# Patient Record
Sex: Female | Born: 1935 | Race: White | Hispanic: No | Smoking: Former smoker
Health system: Southern US, Community
[De-identification: ages and names within clinical notes are randomized; demographics above are authoritative.]

## PROBLEM LIST (undated history)

## (undated) DIAGNOSIS — C50919 Malignant neoplasm of unspecified site of unspecified female breast: Secondary | ICD-10-CM

## (undated) DIAGNOSIS — C189 Malignant neoplasm of colon, unspecified: Secondary | ICD-10-CM

## (undated) DIAGNOSIS — E0590 Thyrotoxicosis, unspecified without thyrotoxic crisis or storm: Secondary | ICD-10-CM

## (undated) DIAGNOSIS — N183 Chronic kidney disease, stage 3 unspecified: Secondary | ICD-10-CM

## (undated) DIAGNOSIS — R42 Dizziness and giddiness: Secondary | ICD-10-CM

## (undated) DIAGNOSIS — E785 Hyperlipidemia, unspecified: Secondary | ICD-10-CM

## (undated) DIAGNOSIS — M1990 Unspecified osteoarthritis, unspecified site: Secondary | ICD-10-CM

## (undated) DIAGNOSIS — M810 Age-related osteoporosis without current pathological fracture: Secondary | ICD-10-CM

## (undated) DIAGNOSIS — D649 Anemia, unspecified: Secondary | ICD-10-CM

## (undated) DIAGNOSIS — K219 Gastro-esophageal reflux disease without esophagitis: Secondary | ICD-10-CM

## (undated) DIAGNOSIS — Z923 Personal history of irradiation: Secondary | ICD-10-CM

## (undated) DIAGNOSIS — H8109 Meniere's disease, unspecified ear: Secondary | ICD-10-CM

## (undated) DIAGNOSIS — I10 Essential (primary) hypertension: Secondary | ICD-10-CM

## (undated) HISTORY — DX: Unspecified osteoarthritis, unspecified site: M19.90

## (undated) HISTORY — DX: Age-related osteoporosis without current pathological fracture: M81.0

## (undated) HISTORY — DX: Dizziness and giddiness: R42

## (undated) HISTORY — DX: Gastro-esophageal reflux disease without esophagitis: K21.9

## (undated) HISTORY — DX: Malignant neoplasm of unspecified site of unspecified female breast: C50.919

## (undated) HISTORY — PX: BREAST LUMPECTOMY: SHX2

## (undated) HISTORY — DX: Thyrotoxicosis, unspecified without thyrotoxic crisis or storm: E05.90

## (undated) HISTORY — DX: Hyperlipidemia, unspecified: E78.5

## (undated) HISTORY — PX: TOTAL HIP ARTHROPLASTY: SHX124

## (undated) HISTORY — PX: APPENDECTOMY: SHX54

## (undated) HISTORY — DX: Essential (primary) hypertension: I10

## (undated) HISTORY — DX: Malignant neoplasm of colon, unspecified: C18.9

## (undated) HISTORY — PX: SMALL INTESTINE SURGERY: SHX150

## (undated) HISTORY — PX: BREAST SURGERY: SHX581

## (undated) HISTORY — PX: BREAST BIOPSY: SHX20

## (undated) HISTORY — PX: TONSILLECTOMY AND ADENOIDECTOMY: SUR1326

## (undated) HISTORY — PX: SPINE SURGERY: SHX786

## (undated) HISTORY — DX: Meniere's disease, unspecified ear: H81.09

---

## 2011-08-27 ENCOUNTER — Other Ambulatory Visit (INDEPENDENT_AMBULATORY_CARE_PROVIDER_SITE_OTHER): Payer: Medicare Other

## 2011-08-27 ENCOUNTER — Ambulatory Visit (INDEPENDENT_AMBULATORY_CARE_PROVIDER_SITE_OTHER): Payer: Medicare Other | Admitting: Internal Medicine

## 2011-08-27 ENCOUNTER — Encounter: Payer: Self-pay | Admitting: Internal Medicine

## 2011-08-27 VITALS — BP 138/82 | HR 80 | Temp 97.8°F | Resp 16 | Ht 62.5 in | Wt 128.0 lb

## 2011-08-27 DIAGNOSIS — E7800 Pure hypercholesterolemia, unspecified: Secondary | ICD-10-CM

## 2011-08-27 DIAGNOSIS — H409 Unspecified glaucoma: Secondary | ICD-10-CM | POA: Insufficient documentation

## 2011-08-27 DIAGNOSIS — Z1231 Encounter for screening mammogram for malignant neoplasm of breast: Secondary | ICD-10-CM

## 2011-08-27 DIAGNOSIS — I10 Essential (primary) hypertension: Secondary | ICD-10-CM

## 2011-08-27 LAB — LDL CHOLESTEROL, DIRECT: Direct LDL: 136.1 mg/dL

## 2011-08-27 LAB — CBC WITH DIFFERENTIAL/PLATELET
Basophils Relative: 0.5 % (ref 0.0–3.0)
Eosinophils Relative: 2.6 % (ref 0.0–5.0)
HCT: 36.6 % (ref 36.0–46.0)
Hemoglobin: 12.4 g/dL (ref 12.0–15.0)
Lymphocytes Relative: 13.3 % (ref 12.0–46.0)
Lymphs Abs: 1.3 10*3/uL (ref 0.7–4.0)
Monocytes Relative: 6.8 % (ref 3.0–12.0)
Neutro Abs: 7.4 10*3/uL (ref 1.4–7.7)
RBC: 3.76 Mil/uL — ABNORMAL LOW (ref 3.87–5.11)
WBC: 9.6 10*3/uL (ref 4.5–10.5)

## 2011-08-27 LAB — COMPREHENSIVE METABOLIC PANEL
CO2: 28 mEq/L (ref 19–32)
Calcium: 9.9 mg/dL (ref 8.4–10.5)
Chloride: 104 mEq/L (ref 96–112)
Creatinine, Ser: 1.5 mg/dL — ABNORMAL HIGH (ref 0.4–1.2)
GFR: 34.32 mL/min — ABNORMAL LOW (ref >60.00)
Glucose, Bld: 102 mg/dL — ABNORMAL HIGH (ref 70–99)
Total Bilirubin: 0.2 mg/dL — ABNORMAL LOW (ref 0.3–1.2)

## 2011-08-27 LAB — TSH: TSH: 1.64 u[IU]/mL (ref 0.35–5.50)

## 2011-08-27 LAB — LIPID PANEL
HDL: 48.7 mg/dL (ref >39.00)
Triglycerides: 297 mg/dL — ABNORMAL HIGH (ref 0.0–149.0)

## 2011-08-27 MED ORDER — VALSARTAN-HYDROCHLOROTHIAZIDE 320-25 MG PO TABS: 0.5000 | ORAL_TABLET | Freq: Every day | ORAL | Status: DC

## 2011-08-27 NOTE — Patient Instructions (Signed)

## 2011-08-29 ENCOUNTER — Encounter: Payer: Self-pay | Admitting: Internal Medicine

## 2011-08-29 NOTE — Assessment & Plan Note (Signed)
Her BP is well controlled, I will check her lytes and renal function today 

## 2011-08-29 NOTE — Assessment & Plan Note (Signed)
Check her FLP TSH and CMP today

## 2011-08-29 NOTE — Progress Notes (Signed)
  Subjective:    Patient ID: Stephanie Martinez, female    DOB: 11/19/1921, 78 y.o.   MRN: [id number]  Hypertension This is a chronic problem. The current episode started more than 1 year ago. The problem has been gradually improving since onset. The problem is controlled. Pertinent negatives include no anxiety, blurred vision, chest pain, headaches, malaise/fatigue, neck pain, orthopnea, palpitations, peripheral edema, PND, shortness of breath or sweats. Past treatments include angiotensin blockers and diuretics. The current treatment provides moderate improvement. There are no compliance problems.  There is no history of chronic renal disease.  Hyperlipidemia This is a chronic problem. The current episode started more than 1 year ago. The problem is uncontrolled. Recent lipid tests were reviewed and are variable. She has no history of chronic renal disease, diabetes, hypothyroidism, liver disease, obesity or nephrotic syndrome. Factors aggravating her hyperlipidemia include thiazides. Pertinent negatives include no chest pain, focal sensory loss, focal weakness, leg pain, myalgias or shortness of breath. She is currently on no antihyperlipidemic treatment. There are no compliance problems.       Review of Systems  Constitutional: Negative for fever, chills, malaise/fatigue, diaphoresis, activity change, appetite change, fatigue and unexpected weight change.  HENT: Negative.  Negative for neck pain.   Eyes: Negative.  Negative for blurred vision.  Respiratory: Negative for cough, chest tightness, shortness of breath, wheezing and stridor.   Cardiovascular: Negative for chest pain, palpitations, orthopnea, leg swelling and PND.  Gastrointestinal: Negative for nausea, vomiting, abdominal pain, diarrhea, constipation and blood in stool.  Genitourinary: Negative.   Musculoskeletal: Negative for myalgias, back pain, joint swelling, arthralgias and gait problem.  Skin: Negative for color change, pallor,  rash and wound.  Neurological: Negative for dizziness, tremors, focal weakness, seizures, syncope, facial asymmetry, speech difficulty, weakness, light-headedness, numbness and headaches.  Hematological: Negative for adenopathy. Does not bruise/bleed easily.  Psychiatric/Behavioral: Negative.        Objective:   Physical Exam  Vitals reviewed. Constitutional: She is oriented to person, place, and time. She appears well-developed and well-nourished. No distress.  HENT:  Head: Normocephalic and atraumatic.  Mouth/Throat: Oropharynx is clear and moist. No oropharyngeal exudate.  Eyes: Conjunctivae are normal. Right eye exhibits no discharge. Left eye exhibits no discharge. No scleral icterus.  Neck: Normal range of motion. Neck supple. No JVD present. No tracheal deviation present. No thyromegaly present.  Cardiovascular: Normal rate, regular rhythm, normal heart sounds and intact distal pulses.  Exam reveals no gallop and no friction rub.   No murmur heard. Pulmonary/Chest: Effort normal and breath sounds normal. No stridor. No respiratory distress. She has no wheezes. She has no rales. She exhibits no tenderness.  Abdominal: Soft. Bowel sounds are normal. She exhibits no distension and no mass. There is no tenderness. There is no rebound and no guarding.  Musculoskeletal: Normal range of motion. She exhibits no edema and no tenderness.  Lymphadenopathy:    She has no cervical adenopathy.  Neurological: She is oriented to person, place, and time.  Skin: Skin is warm and dry. No rash noted. She is not diaphoretic. No erythema. No pallor.  Psychiatric: She has a normal mood and affect. Her behavior is normal. Judgment and thought content normal.          Assessment & Plan:

## 2011-08-29 NOTE — Assessment & Plan Note (Signed)
Referred to ophth

## 2011-09-30 ENCOUNTER — Ambulatory Visit
Admission: RE | Admit: 2011-09-30 | Discharge: 2011-09-30 | Disposition: A | Discharge status: Home / Self Care | Payer: Medicare Other | Source: Ambulatory Visit | Attending: Internal Medicine | Admitting: Internal Medicine

## 2011-09-30 ENCOUNTER — Other Ambulatory Visit: Payer: Self-pay | Admitting: Internal Medicine

## 2011-09-30 DIAGNOSIS — Z9889 Other specified postprocedural states: Secondary | ICD-10-CM

## 2011-09-30 DIAGNOSIS — Z1231 Encounter for screening mammogram for malignant neoplasm of breast: Secondary | ICD-10-CM

## 2011-10-20 ENCOUNTER — Other Ambulatory Visit: Payer: Self-pay

## 2011-10-20 NOTE — Telephone Encounter (Signed)
Left message on machine for daughter to return my call and advise on name/location of pharmacy.

## 2011-10-21 MED ORDER — OMEPRAZOLE 20 MG PO CPDR: 20.0000 mg | DELAYED_RELEASE_CAPSULE | Freq: Every day | ORAL | Status: DC

## 2011-10-21 NOTE — Telephone Encounter (Signed)
Rx sent to pharmacy per daughter's instruction.

## 2011-10-27 ENCOUNTER — Ambulatory Visit (HOSPITAL_COMMUNITY): Payer: Medicare Other

## 2011-10-27 ENCOUNTER — Other Ambulatory Visit: Payer: Self-pay | Admitting: Internal Medicine

## 2011-10-27 ENCOUNTER — Ambulatory Visit
Admission: RE | Admit: 2011-10-27 | Discharge: 2011-10-27 | Disposition: A | Discharge status: Home / Self Care | Payer: Medicare Other | Source: Ambulatory Visit | Attending: Internal Medicine | Admitting: Internal Medicine

## 2011-10-27 DIAGNOSIS — Z9889 Other specified postprocedural states: Secondary | ICD-10-CM

## 2011-12-06 ENCOUNTER — Ambulatory Visit (INDEPENDENT_AMBULATORY_CARE_PROVIDER_SITE_OTHER): Payer: Medicare Other | Admitting: Internal Medicine

## 2011-12-06 ENCOUNTER — Encounter: Payer: Self-pay | Admitting: Internal Medicine

## 2011-12-06 VITALS — BP 130/88 | HR 90 | Temp 98.2°F | Resp 16 | Wt 124.8 lb

## 2011-12-06 DIAGNOSIS — R55 Syncope and collapse: Secondary | ICD-10-CM

## 2011-12-06 DIAGNOSIS — I10 Essential (primary) hypertension: Secondary | ICD-10-CM

## 2011-12-06 NOTE — Progress Notes (Signed)
Subjective:    Patient ID: Stephanie Martinez, female    DOB: 1922-03-17, 78 y.o.   MRN: [id number]  HPI She returns with the complaint that she has had "another fainting spell." She has had intermittent episodes of fainting since she was a teenager with no known triggers. Most recently she had a spell about 6 months ago in [facility] when she slid down in the bathroom and busted her head, her daughter tells me that all testing was normal at time.  Now she has had another event about 4 days ago when she was in the bathroom and she felt dizzy, wobbly, nauseated, and slid down. Her daughter tells me that she was "out of it" for a few seconds and that her eyes looked blank and distanced. After the spell 4 days ago she had profuse vomiting but nothing that looked post-ictal and there was no seizure activity. She also tells me that she has had a high pulse for a long time but she has not had palpitations. Since the spell 4 days ago she feels back to her baseline health with no complaints.   Review of Systems  Constitutional: Negative for fever, chills, diaphoresis, activity change, appetite change, fatigue and unexpected weight change.  HENT: Negative.   Eyes: Negative.   Respiratory: Negative for apnea, cough, choking, chest tightness, shortness of breath, wheezing and stridor.   Cardiovascular: Negative for chest pain, palpitations and leg swelling.  Gastrointestinal: Negative.   Genitourinary: Negative.   Musculoskeletal: Negative.   Skin: Negative.   Neurological: Positive for dizziness and syncope. Negative for tremors, seizures, facial asymmetry, speech difficulty, weakness, light-headedness, numbness and headaches.  Hematological: Negative for adenopathy. Does not bruise/bleed easily.  Psychiatric/Behavioral: Negative.        Objective:   Physical Exam  Vitals reviewed. Constitutional: She is oriented to person, place, and time. She appears well-developed and well-nourished. No distress.  HENT:    Head: Normocephalic and atraumatic.  Mouth/Throat: Oropharynx is clear and moist. No oropharyngeal exudate.  Eyes: Conjunctivae are normal. Right eye exhibits no discharge. Left eye exhibits no discharge. No scleral icterus.  Neck: Normal range of motion. Neck supple. No JVD present. No tracheal deviation present. No thyromegaly present.  Cardiovascular: Regular rhythm, S1 normal, S2 normal, normal heart sounds and intact distal pulses.   No extrasystoles are present. Tachycardia present.  Exam reveals no gallop, no S3 and no S4.   No murmur heard. Pulmonary/Chest: Effort normal and breath sounds normal. No stridor. No respiratory distress. She has no wheezes. She has no rales. She exhibits no tenderness.  Abdominal: Soft. Bowel sounds are normal. She exhibits no distension and no mass. There is no tenderness. There is no rebound and no guarding.  Musculoskeletal: Normal range of motion. She exhibits no edema and no tenderness.  Lymphadenopathy:    She has no cervical adenopathy.  Neurological: She is alert and oriented to person, place, and time. She has normal reflexes. She displays normal reflexes. No cranial nerve deficit. She exhibits normal muscle tone. Coordination normal.  Skin: Skin is warm and dry. No rash noted. She is not diaphoretic. No erythema. No pallor.  Psychiatric: She has a normal mood and affect. Her behavior is normal. Judgment and thought content normal.     Lab Results  Component Value Date   WBC 9.6 08/27/2011   HGB 12.4 08/27/2011   HCT 36.6 08/27/2011   PLT 319.0 08/27/2011   GLUCOSE 102* 08/27/2011   CHOL 225* 08/27/2011   TRIG 297.0*  08/27/2011   HDL 48.70 08/27/2011   LDLDIRECT 136.1 08/27/2011   ALT 15 08/27/2011   AST 22 08/27/2011   NA 141 08/27/2011   K 4.0 08/27/2011   CL 104 08/27/2011   CREATININE 1.5* 08/27/2011   BUN 23 08/27/2011   CO2 28 08/27/2011   TSH 1.64 08/27/2011      Assessment & Plan:

## 2011-12-06 NOTE — Assessment & Plan Note (Signed)
Her BP is well controlled 

## 2011-12-06 NOTE — Patient Instructions (Signed)
Syncope You have had a fainting (syncopal) spell. A fainting episode is a sudden, short-lived loss of consciousness. It results in complete recovery. It occurs because there has been a temporary shortage of oxygen and/or sugar (glucose) to the brain. CAUSES   Blood pressure pills and other medications that may lower blood pressure below normal. Sudden changes in posture (sudden standing).   Over-medication. Take your medications as directed.   Standing too long. This can cause blood to pool in the legs.   Seizure disorders.   Low blood sugar (hypoglycemia) of diabetes. This more commonly causes coma.   Bearing down to go to the bathroom. This can cause your blood pressure to rise suddenly. Your body compensates by making the blood pressure too low when you stop bearing down.   Hardening of the arteries where the brain temporarily does not receive enough blood.   Irregular heart beat and circulatory problems.   Fear, emotional distress, injury, sight of blood, or illness.  Your caregiver will send you home if the syncope was from non-worrisome causes (benign). Depending on your age and health, you may stay to be monitored and observed. If you return home, have someone stay with you if your caregiver feels that is desirable. It is very important to keep all follow-up referrals and appointments in order to properly manage this condition. This is a serious problem which can lead to serious illness and death if not carefully managed.  WARNING: Do not drive or operate machinery until your caregiver feels that it is safe for you to do so. SEEK IMMEDIATE MEDICAL CARE IF:   You have another fainting episode or faint while lying or sitting down. DO NOT DRIVE YOURSELF. Call 911 if no other help is available.   You have chest pain, are feeling sick to your stomach (nausea), vomiting or abdominal pain.   You have an irregular heartbeat or one that is very fast (pulse over 120 beats per minute).    You have a loss of feeling in some part of your body or lose movement in your arms or legs.   You have difficulty with speech, confusion, severe weakness, or visual problems.   You become sweaty and/or feel light headed.  Make sure you are rechecked as instructed. Document Released: 06/07/2005 Document Revised: 05/27/2011 Document Reviewed: 01/26/2007 [employer] Patient Information 2012 [employer]. 

## 2011-12-06 NOTE — Assessment & Plan Note (Signed)
Her EKG today shows sinus tachy with low RWP in V1 and V2, there is no LVH, ischemia, or dysrhythmia. I am concerned that she is having a cardiac dysrhyhmia as the cause of the spells, she does not have any post event neuro s/s to suggest an CNS event, her recent metabolic numbers look good, she does not describe anything that would precipitate hypoglycemia as she has no symptoms that lead up to the event, I have asked her to get an event monitor done in the hops that we will "catch" one of these spells.

## 2011-12-20 ENCOUNTER — Encounter (INDEPENDENT_AMBULATORY_CARE_PROVIDER_SITE_OTHER): Payer: Medicare Other

## 2011-12-20 DIAGNOSIS — R55 Syncope and collapse: Secondary | ICD-10-CM

## 2011-12-29 ENCOUNTER — Ambulatory Visit (INDEPENDENT_AMBULATORY_CARE_PROVIDER_SITE_OTHER): Payer: Medicare Other | Admitting: Internal Medicine

## 2011-12-29 ENCOUNTER — Encounter: Payer: Self-pay | Admitting: Internal Medicine

## 2011-12-29 ENCOUNTER — Telehealth: Payer: Self-pay | Admitting: Internal Medicine

## 2011-12-29 VITALS — BP 150/94 | HR 90 | Temp 97.5°F | Resp 20 | Wt 124.0 lb

## 2011-12-29 DIAGNOSIS — L259 Unspecified contact dermatitis, unspecified cause: Secondary | ICD-10-CM

## 2011-12-29 DIAGNOSIS — L03119 Cellulitis of unspecified part of limb: Secondary | ICD-10-CM | POA: Insufficient documentation

## 2011-12-29 DIAGNOSIS — L02419 Cutaneous abscess of limb, unspecified: Secondary | ICD-10-CM | POA: Insufficient documentation

## 2011-12-29 DIAGNOSIS — L309 Dermatitis, unspecified: Secondary | ICD-10-CM

## 2011-12-29 MED ORDER — HYDROXYZINE HCL 10 MG PO TABS
10.0000 mg | ORAL_TABLET | Freq: Three times a day (TID) | ORAL | Status: AC | PRN
Start: 2011-12-29 — End: 2012-01-08

## 2011-12-29 MED ORDER — METHYLPREDNISOLONE ACETATE 80 MG/ML IJ SUSP
120.0000 mg | Freq: Once | INTRAMUSCULAR | Status: AC
  Administered 2011-12-29: 120 mg via INTRAMUSCULAR

## 2011-12-29 MED ORDER — SULFAMETHOXAZOLE-TRIMETHOPRIM 800-160 MG PO TABS: 1.0000 | ORAL_TABLET | Freq: Two times a day (BID) | ORAL | Status: AC

## 2011-12-29 MED ORDER — CLOBETASOL PROPIONATE 0.05 % EX OINT: TOPICAL_OINTMENT | Freq: Two times a day (BID) | CUTANEOUS | Status: DC

## 2011-12-29 NOTE — Patient Instructions (Signed)
Cellulitis Cellulitis is an infection of the skin and the tissue beneath it. The area is typically red and tender. It is caused by germs (bacteria) (usually staph or strep) that enter the body through cuts or sores. Cellulitis most commonly occurs in the arms or lower legs.  HOME CARE INSTRUCTIONS   If you are given a prescription for medications which kill germs (antibiotics), take as directed until finished.   If the infection is on the arm or leg, keep the limb elevated as able.   Use a warm cloth several times per day to relieve pain and encourage healing.   See your caregiver for recheck of the infected site as directed if problems arise.   Only take over-the-counter or prescription medicines for pain, discomfort, or fever as directed by your caregiver.  SEEK MEDICAL CARE IF:   The area of redness (inflammation) is spreading, there are red streaks coming from the infected site, or if a part of the infection begins to turn dark in color.   The joint or bone underneath the infected skin becomes painful after the skin has healed.   The infection returns in the same or another area after it seems to have gone away.   A boil or bump swells up. This may be an abscess.   New, unexplained problems such as pain or fever develop.  SEEK IMMEDIATE MEDICAL CARE IF:   You have a fever.   You or your child feels drowsy or lethargic.   There is vomiting, diarrhea, or lasting discomfort or feeling ill (malaise) with muscle aches and pains.  MAKE SURE YOU:   Understand these instructions.   Will watch your condition.   Will get help right away if you are not doing well or get worse.  Document Released: 03/17/2005 Document Revised: 05/27/2011 Document Reviewed: 01/24/2008 [facility] Patient Information 2012 [place], [place].Eczema Atopic dermatitis, or eczema, is an inherited type of sensitive skin. Often people with eczema have a family history of allergies, asthma, or hay fever. It causes a  red itchy rash and dry scaly skin. The itchiness may occur before the skin rash and may be very intense. It is not contagious. Eczema is generally worse during the cooler winter months and often improves with the warmth of summer. Eczema usually starts showing signs in infancy. Some children outgrow eczema, but it may last through adulthood. Flare-ups may be caused by:  Eating something or contact with something you are sensitive or allergic to.   Stress.  DIAGNOSIS  The diagnosis of eczema is usually based upon symptoms and medical history. TREATMENT  Eczema cannot be cured, but symptoms usually can be controlled with treatment or avoidance of allergens (things to which you are sensitive or allergic to).  Controlling the itching and scratching.   Use over-the-counter antihistamines as directed for itching. It is especially useful at night when the itching tends to be worse.   Use over-the-counter steroid creams as directed for itching.   Scratching makes the rash and itching worse and may cause impetigo (a skin infection) if fingernails are contaminated (dirty).   Keeping the skin well moisturized with creams every day. This will seal in moisture and help prevent dryness. Lotions containing alcohol and water can dry the skin and are not recommended.   Limiting exposure to allergens.   Recognizing situations that cause stress.   Developing a plan to manage stress.  HOME CARE INSTRUCTIONS   Take prescription and over-the-counter medicines as directed by your caregiver.  Do not use anything on the skin without checking with your caregiver.   Keep baths or showers short (5 minutes) in warm (not hot) water. Use mild cleansers for bathing. You may add non-perfumed bath oil to the bath water. It is best to avoid soap and bubble bath.   Immediately after a bath or shower, when the skin is still damp, apply a moisturizing ointment to the entire body. This ointment should be a petroleum  ointment. This will seal in moisture and help prevent dryness. The thicker the ointment the better. These should be unscented.   Keep fingernails cut short and wash hands often. If your child has eczema, it may be necessary to put soft gloves or mittens on your child at night.   Dress in clothes made of cotton or cotton blends. Dress lightly, as heat increases itching.   Avoid foods that may cause flare-ups. Common foods include cow's milk, peanut butter, eggs and wheat.   Keep a child with eczema away from anyone with fever blisters. The virus that causes fever blisters (herpes simplex) can cause a serious skin infection in children with eczema.  SEEK MEDICAL CARE IF:   Itching interferes with sleep.   The rash gets worse or is not better within one week following treatment.   The rash looks infected (pus or soft yellow scabs).   You or your child has an oral temperature above 102 F (38.9 C).   Your baby is older than 3 months with a rectal temperature of 100.5 F (38.1 C) or higher for more than 1 day.   The rash flares up after contact with someone who has fever blisters.  SEEK IMMEDIATE MEDICAL CARE IF:   Your baby is older than 3 months with a rectal temperature of 102 F (38.9 C) or higher.   Your baby is older than 3 months or younger with a rectal temperature of 100.4 F (38 C) or higher.  Document Released: 06/04/2000 Document Revised: 05/27/2011 Document Reviewed: 04/09/2009 [facility] Patient Information 2012 [place], [place].

## 2011-12-29 NOTE — Telephone Encounter (Signed)
Caller: Nancy/Child; PCP: Sanda Linger; CB#: [phone]; Calling today 12/29/11 regarding Mother has rash on right ankle and goes all the around the ankle and goes up 5-6 inches of her leg.  Looks blistery now, has open areas where she has scratched.  Very itchy.  Onset 12/24/11.  Afebrile.  Emergent symptoms r/o by Rash guidelines with exception of signs and symptoms of worsening infection.  Appt scheduled for today 12/29/11 at 4 PM with Dr. Yetta Barre.

## 2011-12-30 NOTE — Assessment & Plan Note (Signed)
I will treat with an injection of depo-medrol IM today and start high potency steroid as well as atarax for itching, she was asked to stop using the topicals that she has been using

## 2011-12-30 NOTE — Progress Notes (Signed)
  Subjective:    Patient ID: Stephanie Martinez, female    DOB: [birth year], 78 y.o.   MRN: [id number]  Rash This is a new problem. The current episode started 1 to 4 weeks ago. The problem has been gradually worsening since onset. The affected locations include the right lower leg and left arm. The rash is characterized by itchiness, dryness and scaling. She was exposed to nothing. Pertinent negatives include no anorexia, congestion, cough, diarrhea, eye pain, facial edema, fatigue, fever, joint pain, nail changes, rhinorrhea, shortness of breath, sore throat or vomiting. Past treatments include antibiotic cream. The treatment provided no relief.      Review of Systems  Constitutional: Negative for fever, chills, diaphoresis, activity change, appetite change, fatigue and unexpected weight change.  HENT: Negative.  Negative for congestion, sore throat and rhinorrhea.   Eyes: Negative.  Negative for pain.  Respiratory: Negative.  Negative for cough and shortness of breath.   Cardiovascular: Negative.   Gastrointestinal: Negative.  Negative for vomiting, diarrhea and anorexia.  Genitourinary: Negative.   Musculoskeletal: Negative.  Negative for joint pain.  Skin: Positive for rash. Negative for nail changes, color change, pallor and wound.  Neurological: Negative.   Hematological: Negative.   Psychiatric/Behavioral: Negative.        Objective:   Physical Exam  Vitals reviewed. Constitutional: She is oriented to person, place, and time. She appears well-developed and well-nourished. No distress.  HENT:  Head: Normocephalic and atraumatic.  Mouth/Throat: Oropharynx is clear and moist. No oropharyngeal exudate.  Eyes: Conjunctivae are normal. Right eye exhibits no discharge. Left eye exhibits no discharge. No scleral icterus.  Neck: Normal range of motion. Neck supple. No JVD present. No tracheal deviation present. No thyromegaly present.  Cardiovascular: Normal rate, regular rhythm, normal  heart sounds and intact distal pulses.  Exam reveals no gallop and no friction rub.   No murmur heard. Pulmonary/Chest: Effort normal and breath sounds normal. No stridor. No respiratory distress. She has no wheezes. She has no rales. She exhibits no tenderness.  Abdominal: Soft. Bowel sounds are normal. She exhibits no distension and no mass. There is no tenderness. There is no rebound and no guarding.  Musculoskeletal: Normal range of motion. She exhibits no edema and no tenderness.  Lymphadenopathy:    She has no cervical adenopathy.  Neurological: She is oriented to person, place, and time.  Skin: Skin is warm and dry. Rash noted. No abrasion, no bruising, no burn, no ecchymosis, no laceration, no lesion, no petechiae and no purpura noted. Rash is maculopapular. Rash is not macular, not papular, not nodular, not pustular, not vesicular and not urticarial. She is not diaphoretic. No erythema.          She has large patches of erythema/scale/weeping/lichenification, most prominent in her RLE  Psychiatric: She has a normal mood and affect. Her behavior is normal. Judgment and thought content normal.          Assessment & Plan:

## 2011-12-30 NOTE — Assessment & Plan Note (Signed)
I am concerned that she may have some staph infection so I have asked her to start Bactrim-DS

## 2012-01-05 ENCOUNTER — Ambulatory Visit: Payer: Medicare Other | Admitting: Internal Medicine

## 2012-02-15 ENCOUNTER — Emergency Department (HOSPITAL_COMMUNITY): Payer: Medicare Other

## 2012-02-15 ENCOUNTER — Encounter (HOSPITAL_COMMUNITY): Payer: Self-pay | Admitting: Neurology

## 2012-02-15 ENCOUNTER — Inpatient Hospital Stay (HOSPITAL_COMMUNITY): Payer: Medicare Other

## 2012-02-15 ENCOUNTER — Inpatient Hospital Stay (HOSPITAL_COMMUNITY)
Admission: EM | Admit: 2012-02-15 | Discharge: 2012-02-17 | DRG: 069 | Disposition: A | Discharge status: Home Health | Payer: Medicare Other | Attending: Internal Medicine | Admitting: Internal Medicine

## 2012-02-15 DIAGNOSIS — C50919 Malignant neoplasm of unspecified site of unspecified female breast: Secondary | ICD-10-CM | POA: Insufficient documentation

## 2012-02-15 DIAGNOSIS — Z79899 Other long term (current) drug therapy: Secondary | ICD-10-CM

## 2012-02-15 DIAGNOSIS — H9190 Unspecified hearing loss, unspecified ear: Secondary | ICD-10-CM | POA: Diagnosis present

## 2012-02-15 DIAGNOSIS — H409 Unspecified glaucoma: Secondary | ICD-10-CM | POA: Diagnosis present

## 2012-02-15 DIAGNOSIS — H8109 Meniere's disease, unspecified ear: Secondary | ICD-10-CM

## 2012-02-15 DIAGNOSIS — E785 Hyperlipidemia, unspecified: Secondary | ICD-10-CM

## 2012-02-15 DIAGNOSIS — M129 Arthropathy, unspecified: Secondary | ICD-10-CM | POA: Diagnosis present

## 2012-02-15 DIAGNOSIS — Z87891 Personal history of nicotine dependence: Secondary | ICD-10-CM

## 2012-02-15 DIAGNOSIS — Z853 Personal history of malignant neoplasm of breast: Secondary | ICD-10-CM

## 2012-02-15 DIAGNOSIS — R29818 Other symptoms and signs involving the nervous system: Secondary | ICD-10-CM

## 2012-02-15 DIAGNOSIS — R4701 Aphasia: Secondary | ICD-10-CM

## 2012-02-15 DIAGNOSIS — Z66 Do not resuscitate: Secondary | ICD-10-CM | POA: Diagnosis present

## 2012-02-15 DIAGNOSIS — R29810 Facial weakness: Secondary | ICD-10-CM

## 2012-02-15 DIAGNOSIS — K219 Gastro-esophageal reflux disease without esophagitis: Secondary | ICD-10-CM | POA: Diagnosis present

## 2012-02-15 DIAGNOSIS — I10 Essential (primary) hypertension: Secondary | ICD-10-CM

## 2012-02-15 DIAGNOSIS — M169 Osteoarthritis of hip, unspecified: Secondary | ICD-10-CM | POA: Insufficient documentation

## 2012-02-15 DIAGNOSIS — G459 Transient cerebral ischemic attack, unspecified: Secondary | ICD-10-CM | POA: Diagnosis present

## 2012-02-15 LAB — CK TOTAL AND CKMB (NOT AT ARMC)
Relative Index: INVALID (ref 0.0–2.5)
Total CK: 22 U/L (ref 7–177)

## 2012-02-15 LAB — CBC
HCT: 36 % (ref 36.0–46.0)
Hemoglobin: 12.7 g/dL (ref 12.0–15.0)
MCV: 90.5 fL (ref 78.0–100.0)
RBC: 3.98 MIL/uL (ref 3.87–5.11)
RDW: 13 % (ref 11.5–15.5)
WBC: 10.6 10*3/uL — ABNORMAL HIGH (ref 4.0–10.5)

## 2012-02-15 LAB — CBC WITH DIFFERENTIAL/PLATELET
Basophils Absolute: 0 10*3/uL (ref 0.0–0.1)
Eosinophils Relative: 0 % (ref 0–5)
HCT: 35.3 % — ABNORMAL LOW (ref 36.0–46.0)
Lymphocytes Relative: 4 % — ABNORMAL LOW (ref 12–46)
MCHC: 35.1 g/dL (ref 30.0–36.0)
MCV: 90.7 fL (ref 78.0–100.0)
Monocytes Absolute: 0.3 10*3/uL (ref 0.1–1.0)
RDW: 13 % (ref 11.5–15.5)
WBC: 12 10*3/uL — ABNORMAL HIGH (ref 4.0–10.5)

## 2012-02-15 LAB — URINALYSIS, MICROSCOPIC ONLY
Glucose, UA: 100 mg/dL — AB
Ketones, ur: NEGATIVE mg/dL
Nitrite: NEGATIVE
Protein, ur: NEGATIVE mg/dL
pH: 7 (ref 5.0–8.0)

## 2012-02-15 LAB — GLUCOSE, CAPILLARY: Glucose-Capillary: 181 mg/dL — ABNORMAL HIGH (ref 70–99)

## 2012-02-15 LAB — POCT I-STAT, CHEM 8
BUN: 22 mg/dL (ref 6–23)
Creatinine, Ser: 1.3 mg/dL — ABNORMAL HIGH (ref 0.50–1.10)
Potassium: 3.2 mEq/L — ABNORMAL LOW (ref 3.5–5.1)
Sodium: 140 mEq/L (ref 135–145)
TCO2: 24 mmol/L (ref 0–100)

## 2012-02-15 LAB — URINALYSIS, ROUTINE W REFLEX MICROSCOPIC
Glucose, UA: 100 mg/dL — AB
Hgb urine dipstick: NEGATIVE
Ketones, ur: NEGATIVE mg/dL
Protein, ur: NEGATIVE mg/dL

## 2012-02-15 LAB — COMPREHENSIVE METABOLIC PANEL
Albumin: 3.6 g/dL (ref 3.5–5.2)
BUN: 22 mg/dL (ref 6–23)
Calcium: 10.2 mg/dL (ref 8.4–10.5)
Creatinine, Ser: 1.08 mg/dL (ref 0.50–1.10)
GFR calc Af Amer: 52 mL/min — ABNORMAL LOW (ref >90)
Glucose, Bld: 205 mg/dL — ABNORMAL HIGH (ref 70–99)
Total Protein: 7 g/dL (ref 6.0–8.3)

## 2012-02-15 LAB — TROPONIN I: Troponin I: <0.3 ng/mL (ref <0.30)

## 2012-02-15 LAB — RAPID URINE DRUG SCREEN, HOSP PERFORMED
Amphetamines: NOT DETECTED
Opiates: NOT DETECTED

## 2012-02-15 LAB — CREATININE, SERUM
GFR calc Af Amer: 54 mL/min — ABNORMAL LOW (ref >90)
GFR calc non Af Amer: 47 mL/min — ABNORMAL LOW (ref >90)

## 2012-02-15 MED ORDER — ACETAMINOPHEN 650 MG RE SUPP: 650.0000 mg | RECTAL | Status: DC | PRN

## 2012-02-15 MED ORDER — PANTOPRAZOLE SODIUM 40 MG PO TBEC
40.0000 mg | DELAYED_RELEASE_TABLET | Freq: Every day | ORAL | Status: DC
  Administered 2012-02-16 – 2012-02-17 (×2): 40 mg via ORAL
  Filled 2012-02-15: qty 1

## 2012-02-15 MED ORDER — ENOXAPARIN SODIUM 40 MG/0.4ML ~~LOC~~ SOLN: 40.0000 mg | SUBCUTANEOUS | Status: DC

## 2012-02-15 MED ORDER — ASPIRIN EC 325 MG PO TBEC
325.0000 mg | DELAYED_RELEASE_TABLET | Freq: Every day | ORAL | Status: DC
  Administered 2012-02-16 – 2012-02-17 (×2): 325 mg via ORAL
  Filled 2012-02-15 (×2): qty 1

## 2012-02-15 MED ORDER — ASPIRIN 325 MG PO TABS: 325.0000 mg | ORAL_TABLET | Freq: Every day | ORAL | Status: DC

## 2012-02-15 MED ORDER — ENOXAPARIN SODIUM 30 MG/0.3ML ~~LOC~~ SOLN
30.0000 mg | SUBCUTANEOUS | Status: DC
  Administered 2012-02-15 – 2012-02-16 (×2): 30 mg via SUBCUTANEOUS
  Filled 2012-02-15 (×3): qty 0.3

## 2012-02-15 MED ORDER — ONDANSETRON HCL 4 MG/2ML IJ SOLN
INTRAMUSCULAR | Status: AC
  Administered 2012-02-15: 4 mg
  Filled 2012-02-15: qty 2

## 2012-02-15 MED ORDER — SENNOSIDES-DOCUSATE SODIUM 8.6-50 MG PO TABS
1.0000 | ORAL_TABLET | Freq: Every evening | ORAL | Status: DC | PRN
  Filled 2012-02-15: qty 1

## 2012-02-15 MED ORDER — POTASSIUM CHLORIDE CRYS ER 20 MEQ PO TBCR: 40.0000 meq | EXTENDED_RELEASE_TABLET | Freq: Two times a day (BID) | ORAL | Status: DC

## 2012-02-15 MED ORDER — LATANOPROST 0.005 % OP SOLN
1.0000 [drp] | Freq: Every day | OPHTHALMIC | Status: DC
  Administered 2012-02-15 – 2012-02-16 (×2): 1 [drp] via OPHTHALMIC
  Filled 2012-02-15: qty 2.5

## 2012-02-15 MED ORDER — ONDANSETRON HCL 4 MG/2ML IJ SOLN
4.0000 mg | Freq: Four times a day (QID) | INTRAMUSCULAR | Status: DC | PRN
  Administered 2012-02-15: 4 mg via INTRAVENOUS
  Filled 2012-02-15: qty 2

## 2012-02-15 MED ORDER — MECLIZINE HCL 25 MG PO TABS
25.0000 mg | ORAL_TABLET | Freq: Every day | ORAL | Status: DC | PRN
  Filled 2012-02-15: qty 1

## 2012-02-15 MED ORDER — ASPIRIN EC 325 MG PO TBEC
325.0000 mg | DELAYED_RELEASE_TABLET | Freq: Once | ORAL | Status: AC
  Administered 2012-02-15: 325 mg via ORAL
  Filled 2012-02-15: qty 1

## 2012-02-15 MED ORDER — ACETAMINOPHEN 325 MG PO TABS: 650.0000 mg | ORAL_TABLET | ORAL | Status: DC | PRN

## 2012-02-15 MED ORDER — POTASSIUM CHLORIDE CRYS ER 20 MEQ PO TBCR
20.0000 meq | EXTENDED_RELEASE_TABLET | ORAL | Status: AC
  Administered 2012-02-15 (×2): 20 meq via ORAL
  Filled 2012-02-15 (×2): qty 1

## 2012-02-15 NOTE — H&P (Signed)
[facility] History and Physical  Stephanie Martinez UJW:[id number] DOB: March 07, 1922 DOA: 02/15/2012   PCP: Sanda Linger, MD   Chief Complaint: dizziness, and as per the daughter, aphasia and facial droop.  HPI: Stephanie Martinez is a 78 y.o. female h/o Meniere disease, Hypertension, Hyperlipidemia, GERD, was brought in to ed by her daughter after an episode of aphasia and facial droop associated with dizziness this morning. Patient is a poor historian, and most of the history was available from the daughter at the bedside. patient's head is turned to the left side and reports she gets very nauseated and dizzy when she turns her head to the right side.  As per the daughter, her mom started living with her from February, and since then the patient had 3 episodes of dizzy spells and episodes where she stares blankly. She also had few episodes of witnessed syncope. She had a h/o meniers disease for > 60 years. Patient denies any chest pain or palpitations. She denies tingling and numbness in her extremities. She usually ambulates using a walker. She is oriented to persona nd place and not to time.   Review of Systems: The patient denies anorexia, fever, weight loss,, vision loss, decreased hearing, hoarseness, chest pain,  dyspnea on exertion, peripheral edema, abdominal pain,.  Past Medical History  Diagnosis Date  . Glaucoma   . Arthritis   . Hyperlipidemia   . Hypertension   . Meniere disease   . GERD (gastroesophageal reflux disease)   . Breast cancer    Past Surgical History  Procedure Date  . Breast surgery   . Appendectomy   . Spine surgery   . Small intestine surgery    Social History:  reports that she has quit smoking. She has never used smokeless tobacco. She reports that she does not drink alcohol or use illicit drugs.  where does patient live--home,and with daughter   No Known Allergies  Family History  Problem Relation Age of Onset  . Stroke Mother   . Cancer  Father   . Cancer Sister   . Heart disease Neg Hx   . Hyperlipidemia Neg Hx   . Hypertension Neg Hx    Prior to Admission medications   Medication Sig Start Date End Date Taking? Authorizing Provider  calcium citrate-vitamin D (CITRACAL+D) 315-200 MG-UNIT per tablet Take 1 tablet by mouth daily.   Yes Historical Provider, MD  ibuprofen (ADVIL,MOTRIN) 200 MG tablet Take 400 mg by mouth every 6 (six) hours as needed. For pain   Yes Historical Provider, MD  Ibuprofen-Diphenhydramine Cit (ADVIL PM PO) Take 2 tablets by mouth at bedtime as needed. For pain & sleep   Yes Historical Provider, MD  latanoprost (XALATAN) 0.005 % ophthalmic solution Place 1 drop into both eyes at bedtime.    Yes Historical Provider, MD  meclizine (ANTIVERT) 25 MG tablet Take 25 mg by mouth daily as needed. For dizzziness   Yes Historical Provider, MD  MINOXIDIL EX Apply 1 application topically at bedtime.   Yes Historical Provider, MD  naproxen sodium (ANAPROX) 220 MG tablet Take 440 mg by mouth daily as needed. For pain   Yes Historical Provider, MD  omeprazole (PRILOSEC) 20 MG capsule Take 1 capsule (20 mg total) by mouth daily. 10/20/11  Yes Etta Grandchild, MD  OVER THE COUNTER MEDICATION Place 2 tablets under the tongue 3 (three) times daily as needed. Tinnitus Relief ringing in the ears   Yes Historical Provider, MD  valsartan-hydrochlorothiazide (DIOVAN-HCT) 320-25 MG per tablet  Take 0.5 tablets by mouth daily. 08/27/11  Yes Etta Grandchild, MD   Physical Exam: Filed Vitals:   02/15/12 1359 02/15/12 1528 02/15/12 1545 02/15/12 1633  BP: 187/88  175/71 176/74  Pulse: 69  62 62  Temp:  98.1 F (36.7 C)    Resp: 22  15 18   SpO2: 100%  100% 98%   Constitutional: Vital signs reviewed.  Patient is a well-developed and poorly nourished in no acute distress and cooperative with exam. Alert and oriented to person Head: Normocephalic and atraumatic Mouth: no erythema or exudates, dry MM Eyes: PERRL, EOMI, conjunctivae  normal, No scleral icterus.  Neck: Supple, Trachea midline normal ROM, No JVD, mass, thyromegaly, or carotid bruit present.  Cardiovascular: RRR, S1 normal, S2 normal, no MRG, pulses symmetric and intact bilaterally Pulmonary/Chest: CTAB, no wheezes, rales, or rhonchi Abdominal: Soft. Non-tender, non-distended, bowel sounds are normal, no masses, organomegaly, or guarding present.  GU: no CVA tenderness Musculoskeletal: No joint deformities, erythema, or stiffness, ROM full and no nontender Neurological: A&Oriented to person, Strength is normal and symmetric bilaterally,. Patient head is turned to the left side and reports intense dizziness and nausea on turning the head to the right side. Patient was still dizzy and could not make her walk. Facial droop appears to have improved and not evident as she does not  Have dentures Skin: Warm, dry and intact. No rash, cyanosis, or clubbing.      Labs on Admission:  Basic Metabolic Panel:  Lab 02/15/12 1610 02/15/12 1426  NA 140 139  K 3.2* 3.2*  CL 103 100  CO2 -- 27  GLUCOSE 201* 205*  BUN 22 22  CREATININE 1.30* 1.08  CALCIUM -- 10.2  MG -- --  PHOS -- --   Liver Function Tests:  Lab 02/15/12 1426  AST 21  ALT 12  ALKPHOS 94  BILITOT 0.3  PROT 7.0  ALBUMIN 3.6   No results found for this basename: LIPASE:5,AMYLASE:5 in the last 168 hours No results found for this basename: AMMONIA:5 in the last 168 hours CBC:  Lab 02/15/12 1438 02/15/12 1412  WBC -- 12.0*  NEUTROABS -- 11.2*  HGB 12.6 12.4  HCT 37.0 35.3*  MCV -- 90.7  PLT -- 292   Cardiac Enzymes:  Lab 02/15/12 1414  CKTOTAL 22  CKMB 2.4  CKMBINDEX --  TROPONINI <0.30    BNP (last 3 results) No results found for this basename: PROBNP:3 in the last 8760 hours CBG:  Lab 02/15/12 1451  GLUCAP 181*    Radiological Exams on Admission: Dg Chest 2 View  02/15/2012  *RADIOLOGY REPORT*  Clinical Data: Dizzy, falling, nausea, left facial droop  CHEST - 2 VIEW   Comparison: None.  Findings: Upper normal heart size. Calcified tortuous aorta. Pulmonary vascularity normal. Minimal bibasilar atelectasis. Lungs otherwise clear. No pleural effusion or pneumothorax. Bones appear diffusely demineralized.  IMPRESSION: Bibasilar atelectasis.   Original Report Authenticated By: Lollie Marrow, M.D.    Ct Head Wo Contrast  02/15/2012  *RADIOLOGY REPORT*  Clinical Data: Dizziness and nausea.  CT HEAD WITHOUT CONTRAST  Technique:  Contiguous axial images were obtained from the base of the skull through the vertex without contrast.  Comparison: No priors.  Findings: Some high attenuation is noted in the basal ganglia bilaterally, compatible with physiologic calcifications.  Some very mild periventricular low attenuation is noted, compatible with mild chronic microvascular ischemic changes.  Very mild cerebral and cerebellar atrophy is age appropriate.  No definite acute  intracranial abnormalities.  Specifically, no definite evidence of acute intracranial hemorrhage, acute/subacute cerebral ischemia, focal mass, mass effect, hydrocephalus or abnormal intra or extra- axial fluid collections.  No acute displaced skull fractures are identified.  Visualized paranasal sinuses and mastoids are well pneumatized.  IMPRESSION: 1.  No acute intracranial abnormalities. 2.  Mild cerebral and cerebellar atrophy with mild chronic microvascular ischemic changes in the cerebral white matter, as above.   Original Report Authenticated By: Florencia Reasons, M.D.     EKG:NSR.  Assessment/Plan Active Problems:  Meniere disease  Hypertension  Aphasia  Facial droop   1. Aphasia/ facial droop: will work her up for TIA. Initial CT head negative for bleed or acute stroke - MRI  To evaluate for posterior circulation deficits and / MRA of the head and neck , Echocardiogram  And carotid duplex ordered. -Lipid panel and Hgba1c ordered. - EEG ordered to evaluate the blank staring.  - please  neurology in am, depending on the MRI of the brain. - continue with aspirin 325mg  daily.   2. Meniere Disease: on meclizine for symptomatic management.   3. Hypertension: permissive hypertension.   4. DVT prophylaxis: on lovenox.  -   Code Status: DNR Family Communication: DISCUSSED WITH DAUGHTER AT BEDSIDE Disposition Plan: pending PT/OT eval  Time spent: 62 min  Bohdi Leeds [facility] Pager [phone]  If 7PM-7AM, please contact night-coverage [email or web address] Password [facility] 02/15/2012, 5:15 PM

## 2012-02-15 NOTE — ED Provider Notes (Signed)
History     CSN: [id number]  Arrival date & time 02/15/12  1217   First MD Initiated Contact with Patient 02/15/12 1227      Chief Complaint  Patient presents with  . Dizziness  . Nausea    HPI 78 y/o female with glaucoma, HTN, hyperlipidemia, Meniere's disease, and GERD; presenting with dizziness, weakness, and nausea.  Onset was at 8:00AM this AM by pt report (6 hours ago).  Pt was laying in bed at the time and suddenly became dizzy (not vertiginous) and began dry heaving.  She then felt weak and fatigued.  She denies LOC or falling, but a family member believes a fall may have occurred.  When the pt called her daughter on the phone, she was unable to appropriately form words, and was later noted by family members to have mouth droop.  She has had Meniere's disease since childhood and has had many episodes of acute vertigo and LOC in the past.  Her last episode was 5 months ago.  The episode classically begin with vertigo and the patient looses consciousness for only several seconds and then comes to.  Some episodes involve reports of "blank stares".  No seizure activity has ever been documented.  Holter monitor evaluation was unremarkable.     Past Medical History  Diagnosis Date  . Glaucoma   . Arthritis   . Hyperlipidemia   . Hypertension   . Meniere disease   . GERD (gastroesophageal reflux disease)   . Breast cancer     Past Surgical History  Procedure Date  . Breast surgery   . Appendectomy   . Spine surgery   . Small intestine surgery     Family History  Problem Relation Age of Onset  . Stroke Mother   . Cancer Father   . Cancer Sister   . Heart disease Neg Hx   . Hyperlipidemia Neg Hx   . Hypertension Neg Hx     History  Substance Use Topics  . Smoking status: Former Games developer  . Smokeless tobacco: Never Used  . Alcohol Use: No    OB History    Grav Para Term Preterm Abortions TAB SAB Ect Mult Living                  Review of Systems  All other  systems reviewed and are negative.    Allergies  Review of patient's allergies indicates no known allergies.  Home Medications   Current Outpatient Rx  Name Route Sig Dispense Refill  . CALCIUM CITRATE-VITAMIN D 315-200 MG-UNIT PO TABS Oral Take 1 tablet by mouth daily.    Marland Kitchen CLOBETASOL PROPIONATE 0.05 % EX OINT Topical Apply topically 2 (two) times daily. 45 g 0  . FEXOFENADINE HCL 180 MG PO TABS Oral Take 180 mg by mouth daily.    Marland Kitchen LATANOPROST 0.005 % OP SOLN Right Eye Place 1 drop into the right eye at bedtime.    Marland Kitchen MECLIZINE HCL 25 MG PO TABS Oral Take 25 mg by mouth daily as needed.    Marland Kitchen OMEPRAZOLE 20 MG PO CPDR Oral Take 1 capsule (20 mg total) by mouth daily. 90 capsule 1  . VALSARTAN-HYDROCHLOROTHIAZIDE 320-25 MG PO TABS Oral Take 0.5 tablets by mouth daily. 90 tablet 3    BP 190/88  Pulse 75  Resp 16  SpO2 93%  Physical Exam  Constitutional:       Drowsy but arousable  HENT:  Head: Normocephalic and atraumatic.  Mouth/Throat: Uvula  is midline, oropharynx is clear and moist and mucous membranes are normal.  Eyes: Conjunctivae and EOM are normal. Pupils are equal, round, and reactive to light.  Neck: Carotid bruit is not present.  Cardiovascular: Normal rate and regular rhythm.   Pulmonary/Chest: Effort normal and breath sounds normal.  Abdominal: Soft. There is no tenderness.  Neurological: She has normal strength. No cranial nerve deficit or sensory deficit.    ED Course  Procedures (including critical care time)  2:19 PM - Pt reassessed.  Asked RN to draw labs that were ordered.  Pt was I&O cathed 30 minutes ago.  Now has a distended bladder.  Residual scan requested.  3:10 PM - > 700 cc on residual scan.  Foley placed.  3:30 PM - Asked RN to perform stroke swallow screen.  Admitting team notified.  CXR ordered.   3:45 PM - Swallow screen passed.  KDUR and ASA ordered.  Labs Reviewed  URINALYSIS, ROUTINE W REFLEX MICROSCOPIC - Abnormal; Notable for the  following:    Glucose, UA 100 (*)     All other components within normal limits  CBC WITH DIFFERENTIAL - Abnormal; Notable for the following:    WBC 12.0 (*)     HCT 35.3 (*)     Neutrophils Relative 94 (*)     Neutro Abs 11.2 (*)     Lymphocytes Relative 4 (*)     Lymphs Abs 0.5 (*)     Monocytes Relative 2 (*)     All other components within normal limits  POCT I-STAT, CHEM 8 - Abnormal; Notable for the following:    Potassium 3.2 (*)     Creatinine, Ser 1.30 (*)     Glucose, Bld 201 (*)     All other components within normal limits  GLUCOSE, CAPILLARY - Abnormal; Notable for the following:    Glucose-Capillary 181 (*)     All other components within normal limits  PROTIME-INR  APTT  URINE RAPID DRUG SCREEN (HOSP PERFORMED)  CK TOTAL AND CKMB  TROPONIN I  COMPREHENSIVE METABOLIC PANEL  URINALYSIS, WITH MICROSCOPIC   Ct Head Wo Contrast  02/15/2012  *RADIOLOGY REPORT*  Clinical Data: Dizziness and nausea.  CT HEAD WITHOUT CONTRAST  Technique:  Contiguous axial images were obtained from the base of the skull through the vertex without contrast.  Comparison: No priors.  Findings: Some high attenuation is noted in the basal ganglia bilaterally, compatible with physiologic calcifications.  Some very mild periventricular low attenuation is noted, compatible with mild chronic microvascular ischemic changes.  Very mild cerebral and cerebellar atrophy is age appropriate.  No definite acute intracranial abnormalities.  Specifically, no definite evidence of acute intracranial hemorrhage, acute/subacute cerebral ischemia, focal mass, mass effect, hydrocephalus or abnormal intra or extra- axial fluid collections.  No acute displaced skull fractures are identified.  Visualized paranasal sinuses and mastoids are well pneumatized.  IMPRESSION: 1.  No acute intracranial abnormalities. 2.  Mild cerebral and cerebellar atrophy with mild chronic microvascular ischemic changes in the cerebral white matter,  as above.   Original Report Authenticated By: Florencia Reasons, M.D.     EKG Results: Rate:  78 PR:  204 QRS:  82 QTc:  497 EKG: normal EKG, normal sinus rhythm.    1. Transient neurologic deficit       MDM  1.   Neurologic deficit:  Meniere's disease could certainly be the cause of vertigo and nausea, but it is unlikely to cause syncope, fatigue, and weakness.  Infection, metabolic derangements, stroke, seizure, and orthostatic hypotension can cause these symptoms.  Urinalysis from an I&O sample was normal.  Pneumonia is not suspected on history and exam, but a neutrophil predominant leukocytosis prompted a CXR; this is still pending.  K was slightly low, other electrolytes were normal, and she is slightly hyperglycemic; hypokalemia and mild hyperglycemia would not explain the presenting symptoms.  Creatinine is down from where it was in March.  Head CT was normal, but she will be admitted for a TIA workup.  Seizure is not suspected today.  Orthostatic hypotension could cause some dizziness with position change, but would not cause sudden dizziness while lying down, and it would not cause weakness or aphasia.  Lollie Sails, MD 02/15/12 [phone]

## 2012-02-15 NOTE — ED Notes (Signed)
Per ems- pt is deaf but is able to hear due to hearing aid which is in place. Pt here c.o dizziness, nausea which is chronic x 2 months. Pt has been seen by PCP, cardiologist no findings have been found. Has tried antivert with no relief. 4 mg zofran, 20 l. Ac. 178/80, CBG 190, HR 80 SR. No acute neuro deficits. A x 4. Pt lives at home

## 2012-02-15 NOTE — ED Notes (Signed)
792 ml. ED MD made aware

## 2012-02-15 NOTE — Progress Notes (Addendum)
Disposition Note  Stephanie Martinez, is a 78 y.o. female,   MRN: [id number]  -  DOB - 10-14-1921  Outpatient Primary MD for the patient is Sanda Linger, MD   Blood pressure 175/71, pulse 62, temperature 98.1 F (36.7 C), resp. rate 15, SpO2 100.00%.  Active Problems:  Aphasia  Facial droop  Meniere disease  Hypertension H/O deafness - wears hearing aids.   78 yo female with 2 month history of dizziness and nausea.  These symptoms became acutely worse while lying in bed this morning.  She attempted to call her daughter - and could not form words while speaking on the phone.  When her daughter reached the house her mother had left sided facial droop.  In the ED her wbc is elevated at 12 with elevated neutrophils.  K is slightly low 3.2 and creatinine 1.3. She had urinary retention in the ED (over 700 ml) and a foley cath was placed.  I have requested the ED order a cxr, and I am requesting a tele bed for TIA rule out.  Algis Downs, PA-C [facility] Pager: [phone]

## 2012-02-16 ENCOUNTER — Inpatient Hospital Stay (HOSPITAL_COMMUNITY): Payer: Medicare Other

## 2012-02-16 DIAGNOSIS — I517 Cardiomegaly: Secondary | ICD-10-CM

## 2012-02-16 DIAGNOSIS — R29810 Facial weakness: Secondary | ICD-10-CM

## 2012-02-16 DIAGNOSIS — E785 Hyperlipidemia, unspecified: Secondary | ICD-10-CM

## 2012-02-16 LAB — LIPID PANEL
Cholesterol: 226 mg/dL — ABNORMAL HIGH (ref 0–200)
Total CHOL/HDL Ratio: 5 RATIO
Triglycerides: 228 mg/dL — ABNORMAL HIGH (ref <150)
VLDL: 46 mg/dL — ABNORMAL HIGH (ref 0–40)

## 2012-02-16 LAB — CBC
HCT: 33.7 % — ABNORMAL LOW (ref 36.0–46.0)
Hemoglobin: 11.8 g/dL — ABNORMAL LOW (ref 12.0–15.0)
MCHC: 35 g/dL (ref 30.0–36.0)
RBC: 3.68 MIL/uL — ABNORMAL LOW (ref 3.87–5.11)

## 2012-02-16 LAB — BASIC METABOLIC PANEL
BUN: 24 mg/dL — ABNORMAL HIGH (ref 6–23)
CO2: 26 mEq/L (ref 19–32)
GFR calc non Af Amer: 35 mL/min — ABNORMAL LOW (ref >90)
Glucose, Bld: 99 mg/dL (ref 70–99)
Potassium: 3.9 mEq/L (ref 3.5–5.1)

## 2012-02-16 LAB — HEMOGLOBIN A1C: Mean Plasma Glucose: 108 mg/dL (ref <117)

## 2012-02-16 MED ORDER — LORAZEPAM 2 MG/ML IJ SOLN
0.5000 mg | Freq: Once | INTRAMUSCULAR | Status: AC
  Administered 2012-02-16: 0.5 mg via INTRAVENOUS
  Filled 2012-02-16: qty 1

## 2012-02-16 MED ORDER — SIMVASTATIN 20 MG PO TABS
20.0000 mg | ORAL_TABLET | Freq: Every day | ORAL | Status: DC
  Administered 2012-02-16: 20 mg via ORAL
  Filled 2012-02-16 (×2): qty 1

## 2012-02-16 NOTE — Progress Notes (Signed)
OT / PT Cancellation Note  Treatment cancelled today due to medical issues with patient which prohibited therapy (EEG). Please increase activity orders. Pt currently with bedrest orders and OT/PT evaluations pending  Lucile Shutters Pager: [phone]  02/16/2012, 8:15 AM

## 2012-02-16 NOTE — Evaluation (Signed)
Occupational Therapy Evaluation Patient Details Name: Stephanie Martinez MRN: [id number] DOB: Dec 17, 1921 Today's Date: 02/16/2012 Time: 5621-3086 OT Time Calculation (min): 37 min  OT Assessment / Plan / Recommendation Clinical Impression  78 yo female admitted for aphasia and facial droop. Patient is min-guard assist for safety while performing tasks during session. This meets her baseline prior to admission, having assistance from caregiver when needed on a daily basis. OT to sign off.        OT Assessment       Follow Up Recommendations  Supervision/Assistance - 24 hour;No OT follow up    Barriers to Discharge      Equipment Recommendations  None recommended by OT    Recommendations for Other Services    Frequency       Precautions / Restrictions Precautions Precautions: Fall   Pertinent Vitals/Pain BP elevated, see PT evaluation     ADL  Eating/Feeding: Performed;Set up Where Assessed - Eating/Feeding: Chair Grooming: Wash/dry hands;Supervision/safety;Use of adaptive equipment;Performed Where Assessed - Grooming: Supported standing Upper Body Dressing: Simulated;Set up Where Assessed - Upper Body Dressing: Unsupported sitting Toilet Transfer: Simulated;Min guard Toilet Transfer Method: Sit to Barista: Raised toilet seat with arms (or 3-in-1 over toilet) Equipment Used: Gait belt;Rolling walker Transfers/Ambulation Related to ADLs: Patient with decrease gait velocity, demonstrates high fall risk. Patient demonstrates need for increase time and more steps to turn with min-guard assist ADL Comments: Pt. requires cues for rolling walker and uses triangle walker at home and prefers to use that.    OT Diagnosis:    OT Problem List:   OT Treatment Interventions:     OT Goals    Visit Information  Last OT Received On: 02/16/12 Assistance Needed: +1 PT/OT Co-Evaluation/Treatment: Yes    Subjective Data  Subjective: I always feel a little  dizzy. Patient Stated Goal: To return home   Prior Functioning  Vision/Perception  Home Living Lives With: Family Available Help at Discharge: Personal care attendant Type of Home: House Home Access: Stairs to enter;Ramped entrance Entrance Stairs-Number of Steps: 1 Home Layout: One level Bathroom Shower/Tub: Health visitor: Standard Bathroom Accessibility: Yes How Accessible: Accessible via walker Home Adaptive Equipment: Walker - rolling;Bedside commode/3-in-1;Grab bars in shower;Raised toilet seat with rails Prior Function Level of Independence: Needs assistance Needs Assistance: Bathing;Dressing Bath: Minimal Dressing: Minimal Vocation: Retired Musician: No difficulties Dominant Hand: Left   Perception Perception: Within Functional Limits  Cognition  Overall Cognitive Status: Appears within functional limits for tasks assessed/performed Arousal/Alertness: Awake/alert Orientation Level: Appears intact for tasks assessed Behavior During Session: [facility] for tasks performed    Extremity/Trunk Assessment Right Upper Extremity Assessment RUE ROM/Strength/Tone: WFL for tasks assessed RUE Coordination: WFL - gross/fine motor Left Upper Extremity Assessment LUE ROM/Strength/Tone: WFL for tasks assessed LUE Coordination: WFL - gross/fine motor Right Lower Extremity Assessment RLE ROM/Strength/Tone: WFL for tasks assessed RLE Coordination: WFL - gross motor Left Lower Extremity Assessment LLE ROM/Strength/Tone: WFL for tasks assessed LLE Coordination: WFL - gross motor Trunk Assessment Trunk Assessment: Normal   Mobility  Shoulder Instructions  Bed Mobility Bed Mobility: Supine to Sit Supine to Sit: 4: Min guard;With rails Details for Bed Mobility Assistance: Has always used bed rails or sheets to help pull herself upright in bed Transfers Transfers: Sit to Stand;Stand to Sit Sit to Stand: 5: Supervision Stand to Sit: 5: Supervision        Exercise     Balance Balance Balance Assessed: Yes High Level Balance High Level  Balance Activites: Direction changes;Turns   End of Session OT - End of Session Equipment Utilized During Treatment: Gait belt Activity Tolerance: Patient tolerated treatment well Patient left: in chair;with call bell/phone within reach;with family/visitor present  GO     Cleora Fleet 02/16/2012, 3:14 PM

## 2012-02-16 NOTE — Progress Notes (Signed)
See OT Note.    Shameek Nyquist, PT [phone]  

## 2012-02-16 NOTE — Progress Notes (Signed)
EEG completed as ordered.

## 2012-02-16 NOTE — Progress Notes (Signed)
PCP: Sanda Linger, MD  Brief HPI: Stephanie Martinez is a 78 y.o. female h/o Meniere disease, Hypertension, Hyperlipidemia, GERD, was brought in to ed by her daughter after an episode of aphasia and facial droop associated with dizziness on the morning of admission. Patient is a poor historian, and most of the history was available from the daughter at the bedside. patient's head is turned to the left side and reports she gets very nauseated and dizzy when she turns her head to the right side. As per the daughter, her mom started living with her from February, and since then the patient had 3 episodes of dizzy spells and episodes where she stares blankly. She also had few episodes of witnessed syncope. She had a h/o meniers disease for > 60 years. Patient denied any chest pain or palpitations. She denied tingling and numbness in her extremities. She usually ambulates using a walker. She was oriented to person and place and not to time.   Past medical history:  Past Medical History  Diagnosis Date  . Glaucoma   . Arthritis   . Hyperlipidemia   . Hypertension   . Meniere disease   . GERD (gastroesophageal reflux disease)   . Breast cancer     Consultants: None  Procedures: None  Subjective: Patient feels better. Denies any dizziness. Has ambulated without difficulty.  Objective: Vital signs in last 24 hours: Temp:  [97.4 F (36.3 C)-98.6 F (37 C)] 98.6 F (37 C) (08/28 0945) Pulse Rate:  [62-105] 105  (08/28 0945) Resp:  [15-22] 18  (08/28 0945) BP: (124-176)/(68-94) 152/84 mmHg (08/28 0945) SpO2:  [95 %-100 %] 97 % (08/28 0945) Weight:  [56.3 kg (124 lb 1.9 oz)] 56.3 kg (124 lb 1.9 oz) (08/27 1945) Weight change:     Intake/Output from previous day:   Intake/Output this shift: Total I/O In: -  Out: 1300 [Urine:1300]  General appearance: alert, cooperative, appears stated age and no distress Head: Normocephalic, without obvious abnormality, atraumatic Resp: clear to  auscultation bilaterally Cardio: regular rate and rhythm, S1, S2 normal, no murmur, click, rub or gallop GI: soft, non-tender; bowel sounds normal; no masses,  no organomegaly Extremities: extremities normal, atraumatic, no cyanosis or edema Neurologic: Alert. No focal deficits.  Lab Results:  Basename 02/16/12 0622 02/15/12 2032  WBC 11.2* 10.6*  HGB 11.8* 12.7  HCT 33.7* 36.0  PLT 287 268   BMET  Basename 02/16/12 0622 02/15/12 2032 02/15/12 1438 02/15/12 1426  NA 141 -- 140 --  K 3.9 -- 3.2* --  CL 102 -- 103 --  CO2 26 -- -- 27  GLUCOSE 99 -- 201* --  BUN 24* -- 22 --  CREATININE 1.32* 1.04 -- --  CALCIUM 10.0 -- -- 10.2  ALT -- -- -- 12    Studies/Results: Dg Chest 2 View  02/15/2012  *RADIOLOGY REPORT*  Clinical Data: Dizzy, falling, nausea, left facial droop  CHEST - 2 VIEW  Comparison: None.  Findings: Upper normal heart size. Calcified tortuous aorta. Pulmonary vascularity normal. Minimal bibasilar atelectasis. Lungs otherwise clear. No pleural effusion or pneumothorax. Bones appear diffusely demineralized.  IMPRESSION: Bibasilar atelectasis.   Original Report Authenticated By: Lollie Marrow, M.D.    Ct Head Wo Contrast  02/15/2012  *RADIOLOGY REPORT*  Clinical Data: Dizziness and nausea.  CT HEAD WITHOUT CONTRAST  Technique:  Contiguous axial images were obtained from the base of the skull through the vertex without contrast.  Comparison: No priors.  Findings: Some high attenuation is  noted in the basal ganglia bilaterally, compatible with physiologic calcifications.  Some very mild periventricular low attenuation is noted, compatible with mild chronic microvascular ischemic changes.  Very mild cerebral and cerebellar atrophy is age appropriate.  No definite acute intracranial abnormalities.  Specifically, no definite evidence of acute intracranial hemorrhage, acute/subacute cerebral ischemia, focal mass, mass effect, hydrocephalus or abnormal intra or extra- axial fluid  collections.  No acute displaced skull fractures are identified.  Visualized paranasal sinuses and mastoids are well pneumatized.  IMPRESSION: 1.  No acute intracranial abnormalities. 2.  Mild cerebral and cerebellar atrophy with mild chronic microvascular ischemic changes in the cerebral white matter, as above.   Original Report Authenticated By: Florencia Reasons, M.D.    Mr Brain Wo Contrast  02/16/2012  *RADIOLOGY REPORT*  Clinical Data:  Stroke.  Dizziness  MRI HEAD WITHOUT CONTRAST MRA HEAD WITHOUT CONTRAST  Technique:  Multiplanar, multiecho pulse sequences of the brain and surrounding structures were obtained without intravenous contrast. Angiographic images of the head were obtained using MRA technique without contrast.  Comparison:  CT 02/15/2012  MRI HEAD  Findings:  Negative for acute infarct.  Mild patchy hyperintensity in the cerebral white matter bilaterally compatible with chronic microvascular ischemia.  Mild chronic ischemia in the pons and right inferior cerebellum.  Ventricle size is normal.  Age appropriate atrophy.  Negative for hemorrhage or mass lesion.  Paranasal sinuses are clear.  IMPRESSION: Mild chronic microvascular ischemia.  No acute abnormality.  MRA HEAD  Findings: Both vertebral arteries are patent to the basilar.  The basilar is widely patent.  AICA, superior cerebellar, and posterior cerebral arteries are patent without significant stenosis.  Internal carotid artery is patent bilaterally without significant stenosis.  No large vessel occlusion.  Mild irregularity of the middle cerebral artery branches suggesting atherosclerotic disease. Negative for intracranial aneurysm.  Image quality degraded by mild motion.  IMPRESSION: Mild atherosclerotic disease in middle cerebral artery branches is suspected.  No large vessel occlusion or proximal stenosis.   Original Report Authenticated By: Camelia Phenes, M.D.    Mr Mra Head/brain Wo Cm  02/16/2012  *RADIOLOGY REPORT*  Clinical  Data:  Stroke.  Dizziness  MRI HEAD WITHOUT CONTRAST MRA HEAD WITHOUT CONTRAST  Technique:  Multiplanar, multiecho pulse sequences of the brain and surrounding structures were obtained without intravenous contrast. Angiographic images of the head were obtained using MRA technique without contrast.  Comparison:  CT 02/15/2012  MRI HEAD  Findings:  Negative for acute infarct.  Mild patchy hyperintensity in the cerebral white matter bilaterally compatible with chronic microvascular ischemia.  Mild chronic ischemia in the pons and right inferior cerebellum.  Ventricle size is normal.  Age appropriate atrophy.  Negative for hemorrhage or mass lesion.  Paranasal sinuses are clear.  IMPRESSION: Mild chronic microvascular ischemia.  No acute abnormality.  MRA HEAD  Findings: Both vertebral arteries are patent to the basilar.  The basilar is widely patent.  AICA, superior cerebellar, and posterior cerebral arteries are patent without significant stenosis.  Internal carotid artery is patent bilaterally without significant stenosis.  No large vessel occlusion.  Mild irregularity of the middle cerebral artery branches suggesting atherosclerotic disease. Negative for intracranial aneurysm.  Image quality degraded by mild motion.  IMPRESSION: Mild atherosclerotic disease in middle cerebral artery branches is suspected.  No large vessel occlusion or proximal stenosis.   Original Report Authenticated By: Camelia Phenes, M.D.     Medications:  Scheduled:    . aspirin EC  325  mg Oral Once  . aspirin EC  325 mg Oral Daily  . enoxaparin  30 mg Subcutaneous Q24H  . latanoprost  1 drop Both Eyes QHS  . LORazepam  0.5 mg Intravenous Once  . pantoprazole  40 mg Oral Q1200  . potassium chloride  20 mEq Oral Q4H  . DISCONTD: aspirin  325 mg Oral Daily  . DISCONTD: enoxaparin  40 mg Subcutaneous Q24H  . DISCONTD: potassium chloride  40 mEq Oral BID   Continuous:  ZOX:[id number], acetaminophen, meclizine, ondansetron  (ZOFRAN) IV, senna-docusate  Assessment/Plan:  Principal Problem:  *Facial droop Active Problems:  Meniere disease  Hypertension  Aphasia   Aphasia/ facial droop Possible TIA. Stroke w/u negative so far. Await ECHO. EEG is pending as well. She reported spells when she stare into space. Might benefit from Neurology input as OP. Will need Aspirin on discharge. LDL is 135. Will need statin. HBA1C is 5.4.   History of Meniere Disease with Tinnitus On meclizine for symptomatic management. Advised daughter to consider evaluation by ENT as OP if tinnitus is a concern.  Hypertension Permissive hypertension.   DVT prophylaxis On lovenox.    Code Status: DNR  Family Communication: DISCUSSED WITH DAUGHTER AT BEDSIDE  Disposition Plan: Pending PT/OT eval     LOS: 1 day   [facility]  [facility] Pager [phone] 02/16/2012, 2:59 PM

## 2012-02-16 NOTE — Procedures (Signed)
   EEG report This is an 78 y/o WF with known history of vertigo and with recurrent syncopal episodes with worsening of vertigo an EEG was done to rule out seizures for syncope     Hyperventilation: No Photic: yes  This is a routine EEG that begins with eye flutter but the EEG is negative for ictal runs, or epileptiform discharges.  Predominant background activity consisted of low and poorly modulated and defined alpha rhythm.    There is significant symmetrical slowing of the EEG without the evidence of seizures. Stage 2 sleep was not captured, drowsiness was captured. Photic stimulation did not provide any significant driving response. Several areas of muscle artifacts and electrode artifacts are recorded There is mixed delta theta activity.     This EEG shows mild-to-moderate generalized nonspecific  continuous slowing of cerebral activity.    There is significant slowing of the frontal area, unorganized FIRDA can be seen.  This kind of EEG are seen in encephalopathy and neurodegenerative disorders. However, no seizures were recorded.  Christhoper Busbee V-P Eilleen Kempf., MD., Ph.D.,MS 02/16/2012 5:45 PM

## 2012-02-16 NOTE — Progress Notes (Addendum)
*  PRELIMINARY RESULTS* Vascular Ultrasound Carotid Duplex (Doppler) has been completed.  Preliminary findings: Bilateral ICA: no stenosis. Bilateral vertebrals: antegrade.   Stephanie Martinez Stephanie Martinez 02/16/2012, 12:30 PM

## 2012-02-17 DIAGNOSIS — G459 Transient cerebral ischemic attack, unspecified: Secondary | ICD-10-CM | POA: Diagnosis present

## 2012-02-17 MED ORDER — ASPIRIN EC 81 MG PO TBEC: 81.0000 mg | DELAYED_RELEASE_TABLET | Freq: Every day | ORAL | Status: DC

## 2012-02-17 MED ORDER — SIMVASTATIN 20 MG PO TABS: 20.0000 mg | ORAL_TABLET | Freq: Every day | ORAL | Status: DC

## 2012-02-17 MED ORDER — ASPIRIN EC 81 MG PO TBEC: 81.0000 mg | DELAYED_RELEASE_TABLET | Freq: Every day | ORAL | Status: AC

## 2012-02-17 NOTE — Progress Notes (Signed)
Pts o2 sats 97% on ra. I reviewed d/c instructions with pts daughter. No further questions and pt d/c'd home via wheelchair to private vehicle

## 2012-02-17 NOTE — Progress Notes (Signed)
Retro ur ins review. 

## 2012-02-17 NOTE — Evaluation (Signed)
I agree with the following treatment note after reviewing documentation.   Johnston, Rickell Wiehe Brynn   OTR/L Pager: [phone] Office: [phone] .   

## 2012-02-17 NOTE — ED Provider Notes (Signed)
I saw and evaluated the patient, reviewed the resident's note and I agree with the findings and plan.  CRITICAL CARE Performed by: Cyndra Numbers   Total critical care time: 30 minutes  Critical care time was exclusive of separately billable procedures and treating other patients.  Critical care was necessary to treat or prevent imminent or life-threatening deterioration.  Critical care was time spent personally by me on the following activities: development of treatment plan with patient and/or surrogate as well as nursing, discussions with consultants, evaluation of patient's response to treatment, examination of patient, obtaining history from patient or surrogate, ordering and performing treatments and interventions, ordering and review of laboratory studies, ordering and review of radiographic studies, pulse oximetry and re-evaluation of patient's condition.   Cyndra Numbers, MD 02/17/12 [phone]

## 2012-02-17 NOTE — Progress Notes (Signed)
Physical Therapy Evaluation  Past Medical History  Diagnosis Date  . Glaucoma   . Arthritis   . Hyperlipidemia   . Hypertension   . Meniere disease   . GERD (gastroesophageal reflux disease)   . Breast cancer    Past Surgical History  Procedure Date  . Breast surgery   . Appendectomy   . Spine surgery   . Small intestine surgery     02/16/12 1400  PT Visit Information  Last PT Received On 02/17/12  Assistance Needed +1  PT/OT Co-Evaluation/Treatment Yes  PT Time Calculation  PT Start Time 1324  PT Stop Time 1401  PT Time Calculation (min) 37 min  Subjective Data  Subjective Dtr often speaks for pt.    Patient Stated Goal Home  Precautions  Precautions Fall  Restrictions  Weight Bearing Restrictions No  Home Living  Lives With Family  Available Help at Discharge Personal care attendant  Type of Home House  Home Access Stairs to enter;Ramped entrance  Entrance Stairs-Number of Steps 1  Home Layout One level  Bathroom Shower/Tub Walk-in Pension scheme manager Yes  How Accessible Accessible via walker  Home Adaptive Equipment Walker - rolling;Bedside commode/3-in-1;Grab bars in shower;Raised toilet seat with rails  Prior Function  Level of Independence Needs assistance  Needs Assistance Bathing;Dressing  Bath Minimal  Dressing Minimal  Vocation Retired  Geneticist, molecular No difficulties  Cognition  Overall Cognitive Status Appears within functional limits for tasks assessed/performed  Arousal/Alertness Awake/alert  Orientation Level Appears intact for tasks assessed  Behavior During Session [facility] for tasks performed  Right Lower Extremity Assessment  RLE ROM/Strength/Tone [facility] for tasks assessed  Left Lower Extremity Assessment  LLE ROM/Strength/Tone WFL for tasks assessed  Trunk Assessment  Trunk Assessment Normal  Bed Mobility  Bed Mobility Supine to Sit  Supine to Sit 4: Min guard;With rails  Details for Bed  Mobility Assistance Has always used bed rails or sheets to help pull herself upright in bed  Transfers  Transfers Sit to Stand;Stand to Sit  Sit to Stand 5: Supervision  Stand to Sit 5: Supervision  Details for Transfer Assistance cues to get closer to chair prior to sitting.    Ambulation/Gait  Ambulation/Gait Assistance 4: Min guard  Ambulation Distance (Feet) 60 Feet  Assistive device Rolling walker  Ambulation/Gait Assistance Details cues for positioning in RW, encouragement  Gait Pattern Step-through pattern;Decreased stride length;Trunk flexed  Stairs No  Wheelchair Mobility  Wheelchair Mobility No  Modified Rankin (Stroke Patients Only)  Pre-Morbid Rankin Score 3  Modified Rankin 4  Balance  Balance Assessed Yes  High Level Balance  High Level Balance Activites Direction changes;Turns  PT - End of Session  Equipment Utilized During Treatment Gait belt  Activity Tolerance Patient limited by fatigue  Patient left in chair;with call bell/phone within reach;with family/visitor present  Nurse Communication Mobility status  PT Assessment  Clinical Impression Statement pt presents with aphasia and facial droop.  pt seems to be near baseline per daughter other than general fatigue and a little weak.  pt would benefit from HHPT at D/C.    PT Recommendation/Assessment Patient needs continued PT services  PT Problem List Decreased activity tolerance;Decreased balance;Decreased mobility;Decreased knowledge of use of DME;Cardiopulmonary status limiting activity  Barriers to Discharge None  PT Therapy Diagnosis  Difficulty walking  PT Plan  PT Frequency Min 3X/week  PT Treatment/Interventions DME instruction;Gait training;Stair training;Functional mobility training;Therapeutic activities;Therapeutic exercise;Balance training;Patient/family education  PT Recommendation  Follow Up Recommendations Home health PT;Supervision/Assistance - 24 hour  Equipment Recommended None recommended by  PT  Individuals Consulted  Consulted and Agree with Results and Recommendations Patient;Family member/caregiver  Family Member Consulted Daughter  Acute Rehab PT Goals  PT Goal Formulation With patient  Time For Goal Achievement 03/01/12  Potential to Achieve Goals Good  Pt will go Sit to Stand with modified independence  PT Goal: Sit to Stand - Progress Goal set today  Pt will go Stand to Sit with modified independence  PT Goal: Stand to Sit - Progress Goal set today  Pt will Ambulate >150 feet;with modified independence;with rolling walker  PT Goal: Ambulate - Progress Goal set today  Pt will Go Up / Down Stairs 1-2 stairs;with min assist;with least restrictive assistive device  PT Goal: Up/Down Stairs - Progress Goal set today  PT General Charges  $$ ACUTE PT VISIT 1 Procedure  PT Evaluation  $Initial PT Evaluation Tier II 1 Procedure  PT Treatments  $Gait Training 8-22 mins  Written Expression  Dominant Hand Left    Lada Fulbright, PT [phone]

## 2012-02-17 NOTE — Evaluation (Signed)
Speech Language Pathology Evaluation Patient Details Name: Stephanie Martinez MRN: [id number] DOB: 02-04-1922 Today's Date: 02/17/2012 Time: 7846-9629 SLP Time Calculation (min): 10 min  Problem List:  Patient Active Problem List  Diagnosis  . Essential hypertension, benign  . Other screening mammogram  . Pure hypercholesterolemia  . Glaucoma  . Syncope  . Eczema  . Cellulitis and abscess of lower leg  . Meniere disease  . GERD (gastroesophageal reflux disease)  . Hypertension  . Hyperlipidemia  . Arthritis  . Breast cancer  . Aphasia  . Facial droop   Past Medical History:  Past Medical History  Diagnosis Date  . Glaucoma   . Arthritis   . Hyperlipidemia   . Hypertension   . Meniere disease   . GERD (gastroesophageal reflux disease)   . Breast cancer    Past Surgical History:  Past Surgical History  Procedure Date  . Breast surgery   . Appendectomy   . Spine surgery   . Small intestine surgery    HPI:  Stephanie Martinez is a 78 y.o. female h/o Meniere disease, Hypertension, Hyperlipidemia, GERD, was brought in to ed by her daughter after an episode of aphasia and facial droop associated with dizziness on the morning of admission. MD suspecting possible TIA.    Assessment / Plan / Recommendation Clinical Impression  Overall cognitive-linguistic status appears to be at baseline per patient and daughter report and [facility] for return to prior living situation. No further skilled SLP needs indicated at this time. Please reconsult as needed.     SLP Assessment  Patient does not need any further Speech Lanaguage Pathology Services    Follow Up Recommendations  None       Pertinent Vitals/Pain n/a   SLP Goals     SLP Evaluation Prior Functioning  Cognitive/Linguistic Baseline: Within functional limits Type of Home: House Lives With: Family Available Help at Discharge: Personal care attendant Vocation: Retired   IT consultant  Overall Cognitive Status: Appears  within functional limits for tasks assessed Orientation Level: Oriented X4    Comprehension  Auditory Comprehension Overall Auditory Comprehension: Appears within functional limits for tasks assessed Visual Recognition/Discrimination Discrimination: Within Function Limits Reading Comprehension Reading Status: Within funtional limits    Expression Expression Primary Mode of Expression: Verbal Verbal Expression Overall Verbal Expression: Appears within functional limits for tasks assessed Written Expression Dominant Hand: Left Written Expression: Within Functional Limits   Oral / Motor Oral Motor/Sensory Function Overall Oral Motor/Sensory Function: Appears within functional limits for tasks assessed Motor Speech Overall Motor Speech: Appears within functional limits for tasks assessed   GO   Stephanie Lango MA, CCC-SLP [phone]   Stephanie Martinez 02/17/2012, 10:11 AM

## 2012-02-17 NOTE — Discharge Summary (Signed)
Physician Discharge Summary  Patient ID: Stephanie Martinez MRN: [id number] DOB/AGE: [birth year] 78 y.o.  Admit date: 02/15/2012 Discharge date: 02/17/2012  PCP: Sanda Linger, MD  DISCHARGE DIAGNOSES:  Principal Problem:  *TIA (transient ischemic attack) Active Problems:  Meniere disease  Hypertension   RECOMMENDATIONS TO PCP: 1. Will need follow up with ENT 2. Has new hyperlipidemia and started on Simvastatin 3. Consider referral to Neurology for her symptoms  DISCHARGE CONDITION: fair  INITIAL HISTORY: Stephanie Martinez is a 78 y.o. female h/o Meniere disease, Hypertension, Hyperlipidemia, GERD, was brought in to ed by her daughter after an episode of aphasia and facial droop associated with dizziness on the morning of admission. Patient is a poor historian, and most of the history was available from the daughter at the bedside. patient's head is turned to the left side and reports she gets very nauseated and dizzy when she turns her head to the right side. As per the daughter, her mom started living with her from February, and since then the patient had 3 episodes of dizzy spells and episodes where she stares blankly. She also had few episodes of witnessed syncope. She had a h/o meniers disease for > 60 years. Patient denied any chest pain or palpitations. She denied tingling and numbness in her extremities. She usually ambulates using a walker. She was oriented to person and place and not to time.    HOSPITAL COURSE:   Aphasia/ facial droop Likely secondary to TIA This could have been a TIA. No stroke was identified on MRI. Other work up was negative as well including Carotid Dopplers, ECHO and EEG. Diastolic dysfunction was noted but patient is euvolemic. She reported spells when she stare into space but EEG is negative for epileptiform activity. Might benefit from Neurology input as OP which I will defer to PCP. Will need Aspirin on discharge. HBA1C is 5.4.   Hyperlipidemia LDL  is 135. Started on Simvastatin  History of Meniere Disease with Tinnitus  On meclizine for symptomatic management. Advised daughter to consider evaluation by ENT as OP if tinnitus is a concern.   Hypertension  May resume home medications.   Patient remains sable. She complained of right ankle pain. Examination does not reveal any abnormalities. FROM. She can be discharged with home health as recommended by PT.  IMAGING STUDIES Dg Chest 2 View  02/15/2012  *RADIOLOGY REPORT*  Clinical Data: Dizzy, falling, nausea, left facial droop  CHEST - 2 VIEW  Comparison: None.  Findings: Upper normal heart size. Calcified tortuous aorta. Pulmonary vascularity normal. Minimal bibasilar atelectasis. Lungs otherwise clear. No pleural effusion or pneumothorax. Bones appear diffusely demineralized.  IMPRESSION: Bibasilar atelectasis.   Original Report Authenticated By: Lollie Marrow, M.D.    Ct Head Wo Contrast  02/15/2012  *RADIOLOGY REPORT*  Clinical Data: Dizziness and nausea.  CT HEAD WITHOUT CONTRAST  Technique:  Contiguous axial images were obtained from the base of the skull through the vertex without contrast.  Comparison: No priors.  Findings: Some high attenuation is noted in the basal ganglia bilaterally, compatible with physiologic calcifications.  Some very mild periventricular low attenuation is noted, compatible with mild chronic microvascular ischemic changes.  Very mild cerebral and cerebellar atrophy is age appropriate.  No definite acute intracranial abnormalities.  Specifically, no definite evidence of acute intracranial hemorrhage, acute/subacute cerebral ischemia, focal mass, mass effect, hydrocephalus or abnormal intra or extra- axial fluid collections.  No acute displaced skull fractures are identified.  Visualized paranasal sinuses and mastoids are  well pneumatized.  IMPRESSION: 1.  No acute intracranial abnormalities. 2.  Mild cerebral and cerebellar atrophy with mild chronic microvascular  ischemic changes in the cerebral white matter, as above.   Original Report Authenticated By: Florencia Reasons, M.D.    Mr Brain Wo Contrast  02/16/2012  *RADIOLOGY REPORT*  Clinical Data:  Stroke.  Dizziness  MRI HEAD WITHOUT CONTRAST MRA HEAD WITHOUT CONTRAST  Technique:  Multiplanar, multiecho pulse sequences of the brain and surrounding structures were obtained without intravenous contrast. Angiographic images of the head were obtained using MRA technique without contrast.  Comparison:  CT 02/15/2012  MRI HEAD  Findings:  Negative for acute infarct.  Mild patchy hyperintensity in the cerebral white matter bilaterally compatible with chronic microvascular ischemia.  Mild chronic ischemia in the pons and right inferior cerebellum.  Ventricle size is normal.  Age appropriate atrophy.  Negative for hemorrhage or mass lesion.  Paranasal sinuses are clear.  IMPRESSION: Mild chronic microvascular ischemia.  No acute abnormality.  MRA HEAD  Findings: Both vertebral arteries are patent to the basilar.  The basilar is widely patent.  AICA, superior cerebellar, and posterior cerebral arteries are patent without significant stenosis.  Internal carotid artery is patent bilaterally without significant stenosis.  No large vessel occlusion.  Mild irregularity of the middle cerebral artery branches suggesting atherosclerotic disease. Negative for intracranial aneurysm.  Image quality degraded by mild motion.  IMPRESSION: Mild atherosclerotic disease in middle cerebral artery branches is suspected.  No large vessel occlusion or proximal stenosis.   Original Report Authenticated By: Camelia Phenes, M.D.    Mr Mra Head/brain Wo Cm  02/16/2012  *RADIOLOGY REPORT*  Clinical Data:  Stroke.  Dizziness  MRI HEAD WITHOUT CONTRAST MRA HEAD WITHOUT CONTRAST  Technique:  Multiplanar, multiecho pulse sequences of the brain and surrounding structures were obtained without intravenous contrast. Angiographic images of the head were  obtained using MRA technique without contrast.  Comparison:  CT 02/15/2012  MRI HEAD  Findings:  Negative for acute infarct.  Mild patchy hyperintensity in the cerebral white matter bilaterally compatible with chronic microvascular ischemia.  Mild chronic ischemia in the pons and right inferior cerebellum.  Ventricle size is normal.  Age appropriate atrophy.  Negative for hemorrhage or mass lesion.  Paranasal sinuses are clear.  IMPRESSION: Mild chronic microvascular ischemia.  No acute abnormality.  MRA HEAD  Findings: Both vertebral arteries are patent to the basilar.  The basilar is widely patent.  AICA, superior cerebellar, and posterior cerebral arteries are patent without significant stenosis.  Internal carotid artery is patent bilaterally without significant stenosis.  No large vessel occlusion.  Mild irregularity of the middle cerebral artery branches suggesting atherosclerotic disease. Negative for intracranial aneurysm.  Image quality degraded by mild motion.  IMPRESSION: Mild atherosclerotic disease in middle cerebral artery branches is suspected.  No large vessel occlusion or proximal stenosis.   Original Report Authenticated By: Camelia Phenes, M.D.     DISCHARGE EXAMINATION: Blood pressure 147/86, pulse 88, temperature 97.6 F (36.4 C), temperature source Oral, resp. rate 18, height 5' 2.6" (1.59 m), weight 56.3 kg (124 lb 1.9 oz), SpO2 96.00%. General appearance: alert, cooperative, appears stated age and no distress Extremities: extremities normal, atraumatic, no cyanosis or edema. No abnormality in right ankle Neurologic: No focal deficits.  DISPOSITION: Home with daughter  Discharge Orders    Future Orders Please Complete By Expires   Diet - low sodium heart healthy      Increase activity slowly  Discharge instructions      Comments:   Please follow up with your PCP. Consider seeing an ENT doctor for ringing in ear.     Current Discharge Medication List    START taking  these medications   Details  aspirin EC 81 MG tablet Take 1 tablet (81 mg total) by mouth daily. Qty: 30 tablet, Refills: 3    simvastatin (ZOCOR) 20 MG tablet Take 1 tablet (20 mg total) by mouth daily at 6 PM. Qty: 30 tablet, Refills: 2      CONTINUE these medications which have NOT CHANGED   Details  calcium citrate-vitamin D (CITRACAL+D) 315-200 MG-UNIT per tablet Take 1 tablet by mouth daily.    Ibuprofen-Diphenhydramine Cit (ADVIL PM PO) Take 2 tablets by mouth at bedtime as needed. For pain & sleep    latanoprost (XALATAN) 0.005 % ophthalmic solution Place 1 drop into both eyes at bedtime.     meclizine (ANTIVERT) 25 MG tablet Take 25 mg by mouth daily as needed. For dizzziness    MINOXIDIL EX Apply 1 application topically at bedtime.    naproxen sodium (ANAPROX) 220 MG tablet Take 440 mg by mouth daily as needed. For pain    omeprazole (PRILOSEC) 20 MG capsule Take 1 capsule (20 mg total) by mouth daily. Qty: 90 capsule, Refills: 1    OVER THE COUNTER MEDICATION Place 2 tablets under the tongue 3 (three) times daily as needed. Tinnitus Relief ringing in the ears    valsartan-hydrochlorothiazide (DIOVAN-HCT) 320-25 MG per tablet Take 0.5 tablets by mouth daily. Qty: 90 tablet, Refills: 3   Associated Diagnoses: Essential hypertension, benign      STOP taking these medications     ibuprofen (ADVIL,MOTRIN) 200 MG tablet        Follow-up Information    Follow up with Sanda Linger, MD. Schedule an appointment as soon as possible for a visit in 1 week. (post hospitalization follow up)    Contact information:   [address] [facility] [address] [place], [address] [place] [address] [phone]          TOTAL DISCHARGE TIME: 35 mins  [facility]  [facility] Pager [phone]  02/17/2012, 11:28 AM

## 2012-02-25 ENCOUNTER — Ambulatory Visit (INDEPENDENT_AMBULATORY_CARE_PROVIDER_SITE_OTHER): Payer: Medicare Other | Admitting: Internal Medicine

## 2012-02-25 ENCOUNTER — Encounter: Payer: Self-pay | Admitting: Internal Medicine

## 2012-02-25 VITALS — BP 148/92 | HR 80 | Temp 98.2°F | Resp 16 | Wt 125.2 lb

## 2012-02-25 DIAGNOSIS — C50919 Malignant neoplasm of unspecified site of unspecified female breast: Secondary | ICD-10-CM

## 2012-02-25 DIAGNOSIS — I10 Essential (primary) hypertension: Secondary | ICD-10-CM

## 2012-02-25 DIAGNOSIS — H8109 Meniere's disease, unspecified ear: Secondary | ICD-10-CM

## 2012-02-25 DIAGNOSIS — E7800 Pure hypercholesterolemia, unspecified: Secondary | ICD-10-CM

## 2012-02-25 MED ORDER — VALSARTAN-HYDROCHLOROTHIAZIDE 320-25 MG PO TABS: 1.0000 | ORAL_TABLET | Freq: Every day | ORAL | Status: DC

## 2012-02-25 NOTE — Assessment & Plan Note (Signed)
Her BP is not well controlled, I have asked her to stop the nsaids and to increase the the Diovan-HCT to one po qd

## 2012-02-25 NOTE — Patient Instructions (Signed)

## 2012-02-25 NOTE — Assessment & Plan Note (Signed)
She is doing well on simvastatin 

## 2012-02-25 NOTE — Assessment & Plan Note (Signed)
ENT referral

## 2012-02-25 NOTE — Assessment & Plan Note (Addendum)
Onc referral to inquire about a f/up and brca testing

## 2012-02-25 NOTE — Progress Notes (Signed)
Subjective:    Patient ID: Stephanie Martinez, female    DOB: 20-Dec-1921, 78 y.o.   MRN: [id number]  Hypertension This is a chronic problem. The current episode started more than 1 year ago. The problem has been gradually worsening since onset. The problem is uncontrolled. Associated symptoms include peripheral edema. Pertinent negatives include no anxiety, blurred vision, chest pain, headaches, malaise/fatigue, neck pain, orthopnea, palpitations, PND, shortness of breath or sweats. Agents associated with hypertension include NSAIDs. Past treatments include angiotensin blockers and diuretics. The current treatment provides moderate improvement. Hypertensive end-organ damage includes kidney disease. There is no history of renovascular disease. Identifiable causes of hypertension include chronic renal disease.      Review of Systems  Constitutional: Negative.  Negative for malaise/fatigue.  HENT: Positive for hearing loss and tinnitus. Negative for ear pain, facial swelling, neck pain and ear discharge.   Eyes: Negative.  Negative for blurred vision.  Respiratory: Negative for cough, chest tightness, shortness of breath, wheezing and stridor.   Cardiovascular: Positive for leg swelling. Negative for chest pain, palpitations, orthopnea and PND.  Gastrointestinal: Negative.   Genitourinary: Negative.   Musculoskeletal: Positive for arthralgias (both ankles). Negative for myalgias, back pain, joint swelling and gait problem.  Skin: Negative.   Neurological: Positive for dizziness (and vertigo). Negative for tremors, seizures, syncope, facial asymmetry, speech difficulty, weakness, light-headedness, numbness and headaches.  Hematological: Negative for adenopathy. Does not bruise/bleed easily.  Psychiatric/Behavioral: Negative.        Objective:   Physical Exam  Vitals reviewed. Constitutional: She is oriented to person, place, and time. She appears well-developed and well-nourished. No distress.   HENT:  Head: Normocephalic and atraumatic.  Mouth/Throat: Oropharynx is clear and moist. No oropharyngeal exudate.  Eyes: Conjunctivae are normal. Right eye exhibits no discharge. Left eye exhibits no discharge. No scleral icterus.  Neck: Normal range of motion. Neck supple. No JVD present. No tracheal deviation present. No thyromegaly present.  Cardiovascular: Normal rate, regular rhythm, normal heart sounds and intact distal pulses.  Exam reveals no gallop and no friction rub.   No murmur heard. Pulmonary/Chest: Effort normal and breath sounds normal. No stridor. No respiratory distress. She has no wheezes. She has no rales. She exhibits no tenderness.  Abdominal: Soft. Bowel sounds are normal. She exhibits no distension and no mass. There is no tenderness. There is no rebound and no guarding.  Musculoskeletal: Normal range of motion. She exhibits edema (1+ edema in BLE). She exhibits no tenderness.  Lymphadenopathy:    She has no cervical adenopathy.  Neurological: She is oriented to person, place, and time.  Skin: Skin is warm and dry. No rash noted. She is not diaphoretic. No erythema. No pallor.  Psychiatric: She has a normal mood and affect. Her behavior is normal. Judgment and thought content normal.    Dg Chest 2 View  02/15/2012  *RADIOLOGY REPORT*  Clinical Data: Dizzy, falling, nausea, left facial droop  CHEST - 2 VIEW  Comparison: None.  Findings: Upper normal heart size. Calcified tortuous aorta. Pulmonary vascularity normal. Minimal bibasilar atelectasis. Lungs otherwise clear. No pleural effusion or pneumothorax. Bones appear diffusely demineralized.  IMPRESSION: Bibasilar atelectasis.   Original Report Authenticated By: Lollie Marrow, M.D.    Ct Head Wo Contrast  02/15/2012  *RADIOLOGY REPORT*  Clinical Data: Dizziness and nausea.  CT HEAD WITHOUT CONTRAST  Technique:  Contiguous axial images were obtained from the base of the skull through the vertex without contrast.   Comparison: No priors.  Findings:  Some high attenuation is noted in the basal ganglia bilaterally, compatible with physiologic calcifications.  Some very mild periventricular low attenuation is noted, compatible with mild chronic microvascular ischemic changes.  Very mild cerebral and cerebellar atrophy is age appropriate.  No definite acute intracranial abnormalities.  Specifically, no definite evidence of acute intracranial hemorrhage, acute/subacute cerebral ischemia, focal mass, mass effect, hydrocephalus or abnormal intra or extra- axial fluid collections.  No acute displaced skull fractures are identified.  Visualized paranasal sinuses and mastoids are well pneumatized.  IMPRESSION: 1.  No acute intracranial abnormalities. 2.  Mild cerebral and cerebellar atrophy with mild chronic microvascular ischemic changes in the cerebral white matter, as above.   Original Report Authenticated By: Florencia Reasons, M.D.    Mr Brain Wo Contrast  02/16/2012  *RADIOLOGY REPORT*  Clinical Data:  Stroke.  Dizziness  MRI HEAD WITHOUT CONTRAST MRA HEAD WITHOUT CONTRAST  Technique:  Multiplanar, multiecho pulse sequences of the brain and surrounding structures were obtained without intravenous contrast. Angiographic images of the head were obtained using MRA technique without contrast.  Comparison:  CT 02/15/2012  MRI HEAD  Findings:  Negative for acute infarct.  Mild patchy hyperintensity in the cerebral white matter bilaterally compatible with chronic microvascular ischemia.  Mild chronic ischemia in the pons and right inferior cerebellum.  Ventricle size is normal.  Age appropriate atrophy.  Negative for hemorrhage or mass lesion.  Paranasal sinuses are clear.  IMPRESSION: Mild chronic microvascular ischemia.  No acute abnormality.  MRA HEAD  Findings: Both vertebral arteries are patent to the basilar.  The basilar is widely patent.  AICA, superior cerebellar, and posterior cerebral arteries are patent without significant  stenosis.  Internal carotid artery is patent bilaterally without significant stenosis.  No large vessel occlusion.  Mild irregularity of the middle cerebral artery branches suggesting atherosclerotic disease. Negative for intracranial aneurysm.  Image quality degraded by mild motion.  IMPRESSION: Mild atherosclerotic disease in middle cerebral artery branches is suspected.  No large vessel occlusion or proximal stenosis.   Original Report Authenticated By: Camelia Phenes, M.D.    Mr Mra Head/brain Wo Cm  02/16/2012  *RADIOLOGY REPORT*  Clinical Data:  Stroke.  Dizziness  MRI HEAD WITHOUT CONTRAST MRA HEAD WITHOUT CONTRAST  Technique:  Multiplanar, multiecho pulse sequences of the brain and surrounding structures were obtained without intravenous contrast. Angiographic images of the head were obtained using MRA technique without contrast.  Comparison:  CT 02/15/2012  MRI HEAD  Findings:  Negative for acute infarct.  Mild patchy hyperintensity in the cerebral white matter bilaterally compatible with chronic microvascular ischemia.  Mild chronic ischemia in the pons and right inferior cerebellum.  Ventricle size is normal.  Age appropriate atrophy.  Negative for hemorrhage or mass lesion.  Paranasal sinuses are clear.  IMPRESSION: Mild chronic microvascular ischemia.  No acute abnormality.  MRA HEAD  Findings: Both vertebral arteries are patent to the basilar.  The basilar is widely patent.  AICA, superior cerebellar, and posterior cerebral arteries are patent without significant stenosis.  Internal carotid artery is patent bilaterally without significant stenosis.  No large vessel occlusion.  Mild irregularity of the middle cerebral artery branches suggesting atherosclerotic disease. Negative for intracranial aneurysm.  Image quality degraded by mild motion.  IMPRESSION: Mild atherosclerotic disease in middle cerebral artery branches is suspected.  No large vessel occlusion or proximal stenosis.   Original Report  Authenticated By: Camelia Phenes, M.D.    Lab Results  Component Value Date  WBC 11.2* 02/16/2012   HGB 11.8* 02/16/2012   HCT 33.7* 02/16/2012   PLT 287 02/16/2012   GLUCOSE 99 02/16/2012   CHOL 226* 02/16/2012   TRIG 228* 02/16/2012   HDL 45 02/16/2012   LDLDIRECT 136.1 08/27/2011   LDLCALC 135* 02/16/2012   ALT 12 02/15/2012   AST 21 02/15/2012   NA 141 02/16/2012   K 3.9 02/16/2012   CL 102 02/16/2012   CREATININE 1.32* 02/16/2012   BUN 24* 02/16/2012   CO2 26 02/16/2012   TSH 1.64 08/27/2011   INR 0.96 02/15/2012   HGBA1C 5.4 02/16/2012      Assessment & Plan:

## 2012-03-06 ENCOUNTER — Telehealth: Payer: Self-pay | Admitting: Internal Medicine

## 2012-03-06 NOTE — Telephone Encounter (Signed)
Tomma Lightning is a home health nurse that has evaluated Miss Koker and has seen signs of vertigo that she would like to treat the patient for, needs an OK from Dr. Yetta Barre to treat this

## 2012-03-06 NOTE — Telephone Encounter (Signed)
ok 

## 2012-03-06 NOTE — Telephone Encounter (Signed)
Stephanie Martinez notified//LMOVM

## 2012-03-23 DIAGNOSIS — H8109 Meniere's disease, unspecified ear: Secondary | ICD-10-CM

## 2012-03-23 DIAGNOSIS — R42 Dizziness and giddiness: Secondary | ICD-10-CM

## 2012-03-23 DIAGNOSIS — R269 Unspecified abnormalities of gait and mobility: Secondary | ICD-10-CM

## 2012-03-23 DIAGNOSIS — IMO0001 Reserved for inherently not codable concepts without codable children: Secondary | ICD-10-CM

## 2012-03-24 ENCOUNTER — Telehealth: Payer: Self-pay | Admitting: *Deleted

## 2012-03-24 DIAGNOSIS — M169 Osteoarthritis of hip, unspecified: Secondary | ICD-10-CM

## 2012-03-24 NOTE — Telephone Encounter (Signed)
done

## 2012-03-24 NOTE — Telephone Encounter (Signed)
Pt's daughter informed of referral.  

## 2012-03-24 NOTE — Telephone Encounter (Signed)
Pt's daughter is requesting referral to orthopedist for hip injections per recommendation from PT to continue with PT. Pt is currently having good results working with PT and they feel pt may need injections to continue with therapy.

## 2012-03-28 ENCOUNTER — Telehealth: Payer: Self-pay | Admitting: *Deleted

## 2012-03-28 NOTE — Telephone Encounter (Signed)
Left message for pt to return my call so I can schedule a Med Onc appt. 

## 2012-04-05 ENCOUNTER — Telehealth: Payer: Self-pay | Admitting: *Deleted

## 2012-04-05 NOTE — Telephone Encounter (Signed)
Left message for pt to return my call so I can schedule a genetic appt.  

## 2012-04-26 ENCOUNTER — Encounter: Payer: Self-pay | Admitting: Internal Medicine

## 2012-04-26 ENCOUNTER — Ambulatory Visit (INDEPENDENT_AMBULATORY_CARE_PROVIDER_SITE_OTHER): Payer: Self-pay | Admitting: Internal Medicine

## 2012-04-26 VITALS — BP 134/88 | HR 80 | Temp 97.1°F | Resp 16 | Ht 62.0 in | Wt 128.5 lb

## 2012-04-26 DIAGNOSIS — L02419 Cutaneous abscess of limb, unspecified: Secondary | ICD-10-CM

## 2012-04-26 DIAGNOSIS — Z23 Encounter for immunization: Secondary | ICD-10-CM

## 2012-04-26 DIAGNOSIS — M545 Low back pain, unspecified: Secondary | ICD-10-CM | POA: Insufficient documentation

## 2012-04-26 DIAGNOSIS — L03119 Cellulitis of unspecified part of limb: Secondary | ICD-10-CM

## 2012-04-26 DIAGNOSIS — I10 Essential (primary) hypertension: Secondary | ICD-10-CM

## 2012-04-26 DIAGNOSIS — L259 Unspecified contact dermatitis, unspecified cause: Secondary | ICD-10-CM

## 2012-04-26 DIAGNOSIS — M169 Osteoarthritis of hip, unspecified: Secondary | ICD-10-CM

## 2012-04-26 DIAGNOSIS — L309 Dermatitis, unspecified: Secondary | ICD-10-CM

## 2012-04-26 DIAGNOSIS — M1610 Unilateral primary osteoarthritis, unspecified hip: Secondary | ICD-10-CM

## 2012-04-26 MED ORDER — BUPRENORPHINE 10 MCG/HR TD PTWK: 10.0000 ug | MEDICATED_PATCH | TRANSDERMAL | Status: DC

## 2012-04-26 MED ORDER — CLOBETASOL PROPIONATE 0.05 % EX OINT: TOPICAL_OINTMENT | Freq: Two times a day (BID) | CUTANEOUS | Status: DC

## 2012-04-26 NOTE — Patient Instructions (Signed)

## 2012-04-26 NOTE — Assessment & Plan Note (Signed)
She will try butrans for pain, also will send her to pain management

## 2012-04-26 NOTE — Assessment & Plan Note (Signed)
She will restart a topical steroid

## 2012-04-26 NOTE — Assessment & Plan Note (Signed)
She has adequate BP control 

## 2012-04-26 NOTE — Progress Notes (Signed)
Subjective:    Patient ID: Stephanie Martinez, female    DOB: 08/15/1921, 78 y.o.   MRN: [id number]  Arthritis Presents for follow-up visit. She complains of pain. She reports no stiffness, joint swelling or joint warmth. The symptoms have been worsening. Affected locations include the left hip and right hip. Associated symptoms include pain at night, pain while resting and rash (and itching). Pertinent negatives include no diarrhea, dry eyes, dry mouth, dysuria, fatigue, fever, Raynaud's syndrome, uveitis or weight loss.      Review of Systems  Constitutional: Negative for fever, chills, weight loss, diaphoresis, activity change, appetite change, fatigue and unexpected weight change.  HENT: Negative.   Eyes: Negative.   Respiratory: Negative for cough, chest tightness, shortness of breath, wheezing and stridor.   Cardiovascular: Negative for chest pain, palpitations and leg swelling.  Gastrointestinal: Negative for nausea, vomiting, abdominal pain, diarrhea, constipation and blood in stool.  Genitourinary: Negative.  Negative for dysuria.  Musculoskeletal: Positive for back pain, arthralgias and arthritis. Negative for myalgias, joint swelling, gait problem and stiffness.  Skin: Positive for rash (and itching). Negative for color change, pallor and wound.  Neurological: Negative.   Hematological: Negative for adenopathy. Does not bruise/bleed easily.  Psychiatric/Behavioral: Negative.        Objective:   Physical Exam  Vitals reviewed. Constitutional: She is oriented to person, place, and time. She appears well-developed and well-nourished. No distress.  HENT:  Head: Normocephalic and atraumatic.  Mouth/Throat: Oropharynx is clear and moist. No oropharyngeal exudate.  Eyes: Conjunctivae normal are normal. Right eye exhibits no discharge. Left eye exhibits no discharge. No scleral icterus.  Neck: Normal range of motion. Neck supple. No JVD present. No tracheal deviation present. No  thyromegaly present.  Cardiovascular: Normal rate, regular rhythm, normal heart sounds and intact distal pulses.  Exam reveals no gallop and no friction rub.   No murmur heard. Pulmonary/Chest: Effort normal and breath sounds normal. No stridor. No respiratory distress. She has no wheezes. She has no rales. She exhibits no tenderness.  Abdominal: Soft. Bowel sounds are normal. She exhibits no distension and no mass. There is no tenderness. There is no rebound and no guarding.  Musculoskeletal: Normal range of motion. She exhibits edema (trace edema in BLE). She exhibits no tenderness.  Lymphadenopathy:    She has no cervical adenopathy.  Neurological: She is oriented to person, place, and time.  Skin: Skin is warm and dry. Rash noted. No abrasion, no bruising, no burn, no ecchymosis, no laceration, no lesion, no petechiae and no purpura noted. Rash is not macular, not papular, not maculopapular, not nodular, not pustular, not vesicular and not urticarial. She is not diaphoretic. No cyanosis or erythema. No pallor. Nails show no clubbing.     Psychiatric: She has a normal mood and affect. Her behavior is normal. Judgment and thought content normal.      Lab Results  Component Value Date   WBC 11.2* 02/16/2012   HGB 11.8* 02/16/2012   HCT 33.7* 02/16/2012   PLT 287 02/16/2012   GLUCOSE 99 02/16/2012   CHOL 226* 02/16/2012   TRIG 228* 02/16/2012   HDL 45 02/16/2012   LDLDIRECT 136.1 08/27/2011   LDLCALC 135* 02/16/2012   ALT 12 02/15/2012   AST 21 02/15/2012   NA 141 02/16/2012   K 3.9 02/16/2012   CL 102 02/16/2012   CREATININE 1.32* 02/16/2012   BUN 24* 02/16/2012   CO2 26 02/16/2012   TSH 1.64 08/27/2011   INR 0.96 02/15/2012  HGBA1C 5.4 02/16/2012      Assessment & Plan:

## 2012-04-26 NOTE — Assessment & Plan Note (Signed)
She will continue taking nsaids, I have asked her to try butrans for pain relief, she will also see pain management as she is interested in [facility] for her back

## 2012-05-03 ENCOUNTER — Encounter: Payer: Self-pay | Admitting: Physical Medicine & Rehabilitation

## 2012-05-16 ENCOUNTER — Encounter: Payer: Self-pay | Admitting: Physical Medicine & Rehabilitation

## 2012-05-16 ENCOUNTER — Encounter: Payer: Medicare Other | Attending: Physical Medicine & Rehabilitation

## 2012-05-16 ENCOUNTER — Ambulatory Visit (HOSPITAL_BASED_OUTPATIENT_CLINIC_OR_DEPARTMENT_OTHER): Payer: Medicare Other | Admitting: Physical Medicine & Rehabilitation

## 2012-05-16 VITALS — BP 152/87 | HR 110 | Resp 16 | Ht 61.0 in | Wt 124.0 lb

## 2012-05-16 DIAGNOSIS — M545 Low back pain, unspecified: Secondary | ICD-10-CM

## 2012-05-16 DIAGNOSIS — M549 Dorsalgia, unspecified: Secondary | ICD-10-CM | POA: Insufficient documentation

## 2012-05-16 DIAGNOSIS — M47817 Spondylosis without myelopathy or radiculopathy, lumbosacral region: Secondary | ICD-10-CM | POA: Insufficient documentation

## 2012-05-16 DIAGNOSIS — IMO0001 Reserved for inherently not codable concepts without codable children: Secondary | ICD-10-CM

## 2012-05-16 DIAGNOSIS — M25559 Pain in unspecified hip: Secondary | ICD-10-CM | POA: Insufficient documentation

## 2012-05-16 DIAGNOSIS — G8929 Other chronic pain: Secondary | ICD-10-CM | POA: Insufficient documentation

## 2012-05-16 DIAGNOSIS — Z5181 Encounter for therapeutic drug level monitoring: Secondary | ICD-10-CM

## 2012-05-16 DIAGNOSIS — Z96649 Presence of unspecified artificial hip joint: Secondary | ICD-10-CM | POA: Insufficient documentation

## 2012-05-16 MED ORDER — TRAMADOL HCL 50 MG PO TABS: 50.0000 mg | ORAL_TABLET | Freq: Three times a day (TID) | ORAL | Status: DC | PRN

## 2012-05-16 NOTE — Progress Notes (Signed)
Subjective:    Patient ID: Stephanie Martinez, female    DOB: 1921/12/31, 78 y.o.   MRN: [id number]  HPI 25 year history of back pain, insidious onset  had bilateral hip replacement Also had back surgery Right hip was replaced greater than 20 years ago starting to hurt again not contemplating surgery at the current time Was seen at a clinic in [place] where she had some type of back injections 3 injections, primary M.D. Perform these injections without fluoroscopic guidance. Also wass seen at a pain clinic, tried fluoro guided. Had falls x 2 no sig injury Daughter helps with bathing   Pain Inventory Average Pain 6 Pain Right Now 6 My pain is dull and aching  In the last 24 hours, has pain interfered with the following? General activity 4 Relation with others 3 Enjoyment of life 9 What TIME of day is your pain at its worst? evening Sleep (in general) Fair  Pain is worse with: walking, sitting and standing Pain improves with: medication Relief from Meds: 5  Mobility use a walker ability to climb steps?  no do you drive?  no transfers alone Do you have any goals in this area?  yes  Function retired I need assistance with the following:  bathing, meal prep, household duties and shopping  Neuro/Psych trouble walking depression  Prior Studies x-rays CT/MRI  Physicians involved in your care Primary care    Family History  Problem Relation Age of Onset  . Stroke Mother   . Cancer Father   . Cancer Sister   . Heart disease Neg Hx   . Hyperlipidemia Neg Hx   . Hypertension Neg Hx    History   Social History  . Marital Status: Married    Spouse Name: N/A    Number of Children: N/A  . Years of Education: N/A   Social History Main Topics  . Smoking status: Former Games developer  . Smokeless tobacco: Never Used  . Alcohol Use: No  . Drug Use: No  . Sexually Active: Not Currently   Other Topics Concern  . None   Social History Narrative  . None   Past  Surgical History  Procedure Date  . Breast surgery   . Appendectomy   . Spine surgery   . Small intestine surgery   . Total hip arthroplasty    Past Medical History  Diagnosis Date  . Glaucoma(365)   . Arthritis   . Hyperlipidemia   . Hypertension   . Meniere disease   . GERD (gastroesophageal reflux disease)   . Breast cancer   . Vertigo    BP 152/87  Pulse 110  Resp 16  Ht 5\' 1"  (1.549 m)  Wt 124 lb (56.246 kg)  BMI 23.43 kg/m2  SpO2 93%     Review of Systems  Respiratory: Positive for cough.   Musculoskeletal: Positive for back pain and gait problem.  Psychiatric/Behavioral: Positive for dysphoric mood.  All other systems reviewed and are negative.       Objective:   Physical Exam  Constitutional: She is oriented to person, place, and time. She appears well-developed and well-nourished.  HENT:  Head: Normocephalic and atraumatic.  Eyes: Pupils are equal, round, and reactive to light.  Neck: Normal range of motion.  Musculoskeletal:       Lumbar back: She exhibits decreased range of motion and bony tenderness. She exhibits no tenderness, no edema and no spasm.       Back:  Bilateral PSIS tenderness to palpation as well as L4 paraspinal tenderness bilateral as well as bilateral gluteus maximus tenderness  Neurological: She is alert and oriented to person, place, and time. She has normal strength and normal reflexes. Gait abnormal.       Wider base of support, small step length, uses walker to ambulate  Fear of falling  Psychiatric: She has a normal mood and affect.          Assessment & Plan:  1. Chronic low back pain. Given her history as well as examination I suspect this is multifactorial. There is no clear signs of neurogenic claudication. This likely is a combination between lumbar spondylosis and myofascial pain syndrome with possible sacroiliac pain Will check lumbar spine x-rays Perform trigger point injections today in the lumbar area as  well as gluteus maximus Will schedule for medial branch blocks If patient does not respond well to medial branch blocks we'll do sacroiliac injection Once pain is under better control we'll resume physical therapy Trial of tramadol 50 mg every 8 hours when necessary  Trigger point injections Indication myofascial pain only partially responsive to physical therapy and oral medications. Patient states pain is interfering with activities Informed consent was obtained at scarring risks and benefits of the procedure with patient these include bleeding bruising and infection she elects to proceed and has given written consent Patient placed in a for flexed posture bilateral PSIS as well as midpoint gluteus maximus were marked and prepped with Betadine and entered with the 25-gauge 1.5 inch needle and injected with 1 cc of 1% lidocaine. She tolerated procedure well Post procedure instructions given

## 2012-05-16 NOTE — Patient Instructions (Signed)
You had trigger point injections performed today You'll need x-rays performed at [facility] Prior to your next visit Your next visit will be for medial branch blocks under fluoroscopic guidance A prescription for tramadol 50 mg 3 times per day as needed has been written

## 2012-05-25 ENCOUNTER — Telehealth: Payer: Self-pay

## 2012-05-25 ENCOUNTER — Other Ambulatory Visit: Payer: Medicare Other | Admitting: Lab

## 2012-05-25 ENCOUNTER — Ambulatory Visit (HOSPITAL_COMMUNITY)
Admission: RE | Admit: 2012-05-25 | Discharge: 2012-05-25 | Disposition: A | Discharge status: Home / Self Care | Payer: Medicare Other | Source: Ambulatory Visit | Attending: Physical Medicine & Rehabilitation | Admitting: Physical Medicine & Rehabilitation

## 2012-05-25 ENCOUNTER — Encounter: Payer: Self-pay | Admitting: Genetic Counselor

## 2012-05-25 ENCOUNTER — Ambulatory Visit (HOSPITAL_BASED_OUTPATIENT_CLINIC_OR_DEPARTMENT_OTHER): Payer: Medicare Other | Admitting: Genetic Counselor

## 2012-05-25 DIAGNOSIS — M545 Low back pain, unspecified: Secondary | ICD-10-CM | POA: Insufficient documentation

## 2012-05-25 DIAGNOSIS — IMO0002 Reserved for concepts with insufficient information to code with codable children: Secondary | ICD-10-CM

## 2012-05-25 DIAGNOSIS — Q762 Congenital spondylolisthesis: Secondary | ICD-10-CM | POA: Insufficient documentation

## 2012-05-25 DIAGNOSIS — C189 Malignant neoplasm of colon, unspecified: Secondary | ICD-10-CM

## 2012-05-25 DIAGNOSIS — Z1501 Genetic susceptibility to malignant neoplasm of breast: Secondary | ICD-10-CM

## 2012-05-25 DIAGNOSIS — Z85038 Personal history of other malignant neoplasm of large intestine: Secondary | ICD-10-CM

## 2012-05-25 DIAGNOSIS — Z853 Personal history of malignant neoplasm of breast: Secondary | ICD-10-CM

## 2012-05-25 DIAGNOSIS — C50919 Malignant neoplasm of unspecified site of unspecified female breast: Secondary | ICD-10-CM

## 2012-05-25 DIAGNOSIS — M4120 Other idiopathic scoliosis, site unspecified: Secondary | ICD-10-CM | POA: Insufficient documentation

## 2012-05-25 DIAGNOSIS — M47817 Spondylosis without myelopathy or radiculopathy, lumbosacral region: Secondary | ICD-10-CM | POA: Insufficient documentation

## 2012-05-25 NOTE — Telephone Encounter (Signed)
Daughter called stating that pt has not seen pain manag, nor has she had genetic testing yet. She would like to know if follow up appt 05/26/12 should be cx until she has been seen and report from testing has been completed.

## 2012-05-25 NOTE — Telephone Encounter (Signed)
Yes, cancel and reschedule later

## 2012-05-25 NOTE — Progress Notes (Signed)
Dr.  Sanda Linger requested a consultation for genetic counseling and risk assessment for Stephanie Martinez, a 78 y.o. female, for discussion of her personal history of breast and colon cancer and family history of breast and colon cancer and a BRCA2 mutation found in both of her daughters.. She presents to clinic today to discuss the possibility of a genetic predisposition to cancer, and to further clarify her risks, as well as her family members' risks for cancer.   HISTORY OF PRESENT ILLNESS: In 2004, at the age of 33, Stephanie Martinez was diagnosed with breast cancer.  In 1993, at the age of 53, Stephanie Martinez was diagnosed with colon cancer.    Past Medical History  Diagnosis Date  . Glaucoma(365)   . Arthritis   . Hyperlipidemia   . Hypertension   . Meniere disease   . GERD (gastroesophageal reflux disease)   . Breast cancer   . Vertigo   . Colon cancer     Past Surgical History  Procedure Date  . Breast surgery   . Appendectomy   . Spine surgery   . Small intestine surgery   . Total hip arthroplasty     History  Substance Use Topics  . Smoking status: Former Games developer  . Smokeless tobacco: Never Used  . Alcohol Use: No    REPRODUCTIVE HISTORY AND PERSONAL RISK ASSESSMENT FACTORS: Menarche was at age 53.   Menopause was at 59-60 Uterus Intact: Yes Ovaries Intact: Yes G2P2A0 , first live birth at age 41  She has not previously undergone treatment for infertility.   Never used OCPs   She has used HRT in the past.    FAMILY HISTORY:  We obtained a detailed, 4-generation family history.  Significant diagnoses are listed below: Family History  Problem Relation Age of Onset  . Stroke Mother   . Colon cancer Father     dx in 90s  . Lung cancer Sister 19  . Heart disease Neg Hx   . Hyperlipidemia Neg Hx   . Hypertension Neg Hx   . Breast cancer Paternal Aunt     <50  . BRCA 1/2 Daughter     BRCA2 positive  . BRCA 1/2 Daughter     BRCA2 positive  .  Breast cancer Sister 8    bilateral breast cancer, 2nd dx at 84  . Colon cancer Sister 54  . Breast cancer Sister 59    bilateral breast cancer; 2nd dx age 75  . Leukemia Other     dx in his 46s  The patient was diagnosed with colon cancer at age 36 and breast cancer at 70.  Her daughters both tested positive for a BRCA2 mutation.  The patient had 3 sisters.  One sister died of lung cancer at 15.  Another sister was diagnosed with breast cancer at ages 32 and 75 and colon cancer at 60.  This sister had a son with leukemia in his 38s and died.  The third sister was diagnosed with breast cancer at ages 6 and 65.  The patients father was diagnosed with colon cancer in his 37s.  He had three brothers and two sisters.  One sister was diagnosed with breast cancer under the age of 37.  There is no other reported cancer history.  Patient's maternal ancestors are of [place] descent, and paternal ancestors are of [place] descent. There is no reported Ashkenazi Jewish ancestry. There is no  known consanguinity.  GENETIC COUNSELING RISK ASSESSMENT, DISCUSSION, AND SUGGESTED  FOLLOW UP: We reviewed the natural history and genetic etiology of sporadic, familial and hereditary cancer syndromes.  We discussed that the patient has a 50% chance of testing positive for a BRCA2 mutation.  However, in taking the family history of her husband, there is also a chance that this could come from his side.  Additionally, the patient meets [place] criteria for Lynch syndrome.  We discussed the possibility that the patient may not have a BRCA2 mutation, but instead a mutation in a different hereditary breast cancer gene, and/or a mutation in a hereditary colon cancer gene.  Because we are testing from the bottom of the pedigree up, rather than from the top down, we will consider a larger comprehensive cancer panel.  The patient's personal history of breast and colon cancer and family history of breast and colon cancer and BRCA2  mutations is suggestive of the following possible diagnosis: hereditary cancer syndrome  We discussed that identification of a hereditary cancer syndrome may help her care providers tailor the patients medical management. If a mutation indicating a hereditary cancer syndroem is detected in this case, the [employer] recommendations would include increased cancer surveillance and possible prophylactic surgery. If a mutation is detected, the patient will be referred back to the referring provider and to any additional appropriate care providers to discuss the relevant options.   If a mutation is not found in the patient, this will decrease the likelihood of a hereditary cancer syndrome as the explanation for his personal history. Cancer surveillance options would be discussed for the patient according to the appropriate standard National Comprehensive Cancer Network and American Cancer Society guidelines, with consideration of their personal and family history risk factors. In this case, the patient will be referred back to their care providers for discussions of management.    After considering the risks, benefits, and limitations, the patient provided informed consent for  the following  testing: comprehensive cancer panel through [facility].   Per the patient's request, we will contact her daughter, Stephanie Martinez, by telephone to discuss these results. A follow up genetic counseling visit will be scheduled if indicated.  The patient was seen for a total of 60 minutes, greater than 50% of which was spent face-to-face counseling.  This plan is being carried out per Dr. Sanda Linger recommendations.  This note will also be sent to the referring provider via the electronic medical record. The patient will be supplied with a summary of this genetic counseling discussion as well as educational information on the discussed hereditary cancer syndromes following the conclusion of their visit.    Patient was discussed with Dr. Drue Second. EPIC CC: Sanda Linger, MD   _______________________________________________________________________ For Office Staff:  Number of people involved in session: 3 Was an Intern/ student involved with case: no

## 2012-05-25 NOTE — Telephone Encounter (Signed)
Patient daughter notified and appt cx

## 2012-05-26 ENCOUNTER — Ambulatory Visit: Payer: Medicare Other | Admitting: Internal Medicine

## 2012-06-08 ENCOUNTER — Encounter: Payer: Self-pay | Admitting: Physical Medicine & Rehabilitation

## 2012-06-08 ENCOUNTER — Encounter: Payer: Medicare Other | Attending: Physical Medicine & Rehabilitation

## 2012-06-08 ENCOUNTER — Telehealth: Payer: Self-pay | Admitting: Genetic Counselor

## 2012-06-08 ENCOUNTER — Ambulatory Visit (HOSPITAL_BASED_OUTPATIENT_CLINIC_OR_DEPARTMENT_OTHER): Payer: Medicare Other | Admitting: Physical Medicine & Rehabilitation

## 2012-06-08 VITALS — BP 175/94 | HR 115 | Resp 16 | Ht 61.0 in | Wt 127.0 lb

## 2012-06-08 DIAGNOSIS — M47817 Spondylosis without myelopathy or radiculopathy, lumbosacral region: Secondary | ICD-10-CM

## 2012-06-08 DIAGNOSIS — M25559 Pain in unspecified hip: Secondary | ICD-10-CM | POA: Insufficient documentation

## 2012-06-08 DIAGNOSIS — IMO0001 Reserved for inherently not codable concepts without codable children: Secondary | ICD-10-CM | POA: Insufficient documentation

## 2012-06-08 DIAGNOSIS — M549 Dorsalgia, unspecified: Secondary | ICD-10-CM | POA: Insufficient documentation

## 2012-06-08 DIAGNOSIS — Z96649 Presence of unspecified artificial hip joint: Secondary | ICD-10-CM | POA: Insufficient documentation

## 2012-06-08 DIAGNOSIS — G8929 Other chronic pain: Secondary | ICD-10-CM | POA: Insufficient documentation

## 2012-06-08 NOTE — Progress Notes (Signed)
  PROCEDURE RECORD [facility]   Name: Georganne Siple DOB:[birth year] MRN: [id number]  Date:06/08/2012  Physician: Claudette Laws, MD    Nurse/CMA: Judeth Cornfield, CMA  Allergies:  Allergies  Allergen Reactions  . Iodine     Consent Signed: yes  Is patient diabetic? no  CBG today?   Pregnant: no LMP: No LMP recorded. Patient is postmenopausal. (age 76-55)  Anticoagulants: no Anti-inflammatory: no Antibiotics: no  Procedure: Medial Branch Block bilateral  Position:Prone Start Time: 9:22a End Time: 9:32 Fluoro Time: 26 seconds  RN/CMA Aerilyn Slee, CMA [place]    Time 9:01 9:38    BP 175/94 190/87    Pulse 115 105    Respirations 16 16    O2 Sat 93 94    S/S 6 6    Pain Level 8 8     D/C home with Daughter, [name], patient A & O X 3, D/C instructions reviewed, and sits independently.

## 2012-06-08 NOTE — Telephone Encounter (Signed)
Revealed that her mother was negative for the BRCA2 mutation that was found in her.  We are still waiting on the Comprehensive genetic panel.

## 2012-06-08 NOTE — Progress Notes (Signed)
Xray 05/25/12 IMPRESSION:  Severe multilevel lumbar spondylosis with mild levoconvex  scoliosis. Degenerative grade 1 anterolisthesis of L4 on L5. No  compression fracture or acute osseous abnormality.   Bilateral Lumbar L3, L4  medial branch blocks and L 5 dorsal ramus injection under fluoroscopic guidance  Indication: Lumbar pain which is not relieved by medication management or other conservative care and interfering with self-care and mobility.  Informed consent was obtained after describing risks and benefits of the procedure with the patient, this includes bleeding, infection, paralysis and medication side effects.  The patient wishes to proceed and has given written consent.  The patient was placed in prone position.  The lumbar area was marked and prepped with Betadine.  One mL of 1% lidocaine was injected into each of 6 areas into the skin and subcutaneous tissue.  Then a 22-gauge 3.5  spinal needle was inserted targeting the junction of the left S1 superior articular process and sacral ala junction. Needle was advanced under fluoroscopic guidance.  Bone contact was made.  Omnipaque 180 was injected x 0.5 mL demonstrating no intravascular uptake.  Then a solution containing one mL of 4 mg per mL dexamethasone and 3 mL of 2% MPF lidocaine was injected x 0.5 mL.  Then the left L5 superior articular process in transverse process junction was targeted.  Bone contact was made.  Omnipaque 180 was injected x 0.5 mL demonstrating no intravascular uptake. Then a solution containing one mL of 4 mg per mL dexamethasone and 3 mL of 2% MPF lidocaine was injected x 0.5 mL.  Then the left L4 superior articular process in transverse process junction was targeted.  Bone contact was made.  Omnipaque 180 was injected x 0.5 mL demonstrating no intravascular uptake.  Then a solution containing one mL of 4 mg per mL dexamethasone and 3 mL if 2% MPF lidocaine was injected x 0.5 mL.  This same procedure was performed on  the right side using the same needle, technique and injectate.  Patient tolerated procedure well.  Post procedure instructions were given.

## 2012-06-08 NOTE — Patient Instructions (Signed)
Facet Block A facet block is an injection procedure used to numb nerves near a spinal joint (facet). The injection usually includes a medicine like Novacaine (anesthetic) and a steroid medicine (similar to cortisone). The injections are made directly into the facet joint of the back. They are used for patients with several types of neck or back pain problems (such as worsening arthritis or persistent pain after surgery) that have not been helped with anti-inflammatory medications, exercise programs, physical therapy, and other forms of pain management. Multiple injections may be needed depending on how many joints are involved.  A facet block procedure can be helpful with diagnosis as well as providing therapeutic pain relief. One of three things may happen after the procedure:  The pain does not go away. This can mean that the pain is probably not coming from blocked facet joints. This information is helpful with diagnosis.  The pain goes away and stays away for a few hours but the original pain comes back and does not get better again. This information is also helpful with diagnosis. It can mean that pain is probably coming from the joints; but the steroid was not helpful for longer term pain control.  The pain goes away after the block, then returns later that day, and then gets better again over the next few days. This can mean that the block was helpful for pain control and the steroid had a longer lasting effect. If there is good, lasting benefit from the injections, the block may be repeated from 3 to 5 times. If there is good relief but it is only of short-term benefit, other procedures (such as radiofrequency lesioning) may be considered.  Note: The procedure cannot be performed if you have an active infection, a lesion on or near the area of injection, flu, cold, fever, very high blood pressure or if you are on blood thinners. Please make your doctor aware of any of these conditions. This is for  your safety!  LET YOUR CAREGIVER KNOW ABOUT:   Allergies.  Medications taken including herbs, eye drops, over the counter medications, and creams  Use of steroids (by mouth or creams).  Possible pregnancy, if applicable.  Previous problems with anesthetics or Novocaine.  History of blood clots.  History of bleeding or blood problems.  Previous surgery, particularly of the neck and/or back  Other health problems. RISKS AND COMPLICATIONS These are very uncommon but include:  Bleeding.  Injury to a nerve near the injection site.  Weakness or numbness in areas controlled by nerves near the injection site.  Infection.  Pain at the site of the injection.  Temporary fluid retention in those who are prone to this problem.  Allergic reaction to anesthetics or medicines used during the procedure. Diabetics may have a temporary increase in their blood sugar after any surgical procedure, especially if steroids are used. Stinging/burning of the numbing medicine is the most uncomfortable part of the procedure; however every person's response to any procedure is individual.  BEFORE THE PROCEDURE   Your caregiver will provide instructions about stopping any medication before the procedure.  Unless advised otherwise, if the injections are in your neck, you may take your medications as usual with a sip of water but do not eat or drink for 6 hours before the procedure.  Unless advised otherwise, you may eat, drink and take your medications as usual on the day of the procedure (both before and after) if the injections are to be in your lower back.    There is no other specific preparation necessary unless advised otherwise. PROCEDURE After checking your blood pressure, the procedure will be done in the x-ray (fluoroscopy) room while lying on your stomach. For procedures in the neck, an intravenous line is usually started. The back is then cleansed with an antiseptic soap. Sterile drapes are  placed in this area. The skin is numbed with a local anesthetic. This is felt as a stinging or burning sensation. Using x-ray guidance, needles are then advanced to the appropriate locations. Once the needles are in the proper location, the anesthetic and steroid is injected through the needles and the needles are removed. The skin is then cleansed and bandages are applied. Blood pressure will be checked again, and you will be discharged to leave with your ride after your caregiver says it is okay to go.  AFTER THE PROCEDURE  You may not drive for the remainder of the day after your procedure. An adult must be present to drive you home or to go with you in a taxi or on public transportation. The procedure will be canceled if you do not have a responsible adult with you! This is for your safety.  HOME CARE INSTRUCTIONS   The bandages noted above can be removed on the morning after the procedure.  Resume medications according to your caregiver's instructions.  No heat is to be used near or over the injected area(s) for the remainder of the day.  No tub bath or soaking in water (such as a pool, jacuzzi, etc.) for the remainder of the day.  Some local tenderness may be experienced for a couple of days after the injection. Using an ice pack three or four times a day will help this.  Keep track of the amount of pain relief as well as how long the pain relief lasted. SEEK MEDICAL CARE IF:   There is drainage from the injection site.  Pain is not controlled with medications prescribed.  There is significant bleeding or swelling. SEEK IMMEDIATE MEDICAL CARE IF:   You develop a fever of 101 F (38.3 C) or greater.  Worsening pain, swelling, and/or red streaking develops in the skin around the injection site.  Severe pain develops and cannot be controlled with medications prescribed.  You develop any headache, stiff neck, nausea, vomiting, or your eyes become very sensitive to light.  Weakness  or paralysis develops in arms or legs not present before the procedure.  You develop difficulty urinating or difficulty breathing. Document Released: 10/27/2006 Document Revised: 08/30/2011 Document Reviewed: 10/17/2008 Exit[employer] Patient Information 2013 [employer].  

## 2012-06-17 ENCOUNTER — Other Ambulatory Visit: Payer: Self-pay | Admitting: Internal Medicine

## 2012-07-11 ENCOUNTER — Ambulatory Visit (HOSPITAL_BASED_OUTPATIENT_CLINIC_OR_DEPARTMENT_OTHER): Payer: Medicare Other | Admitting: Physical Medicine & Rehabilitation

## 2012-07-11 ENCOUNTER — Encounter: Payer: Self-pay | Admitting: Physical Medicine & Rehabilitation

## 2012-07-11 ENCOUNTER — Encounter: Payer: Medicare Other | Attending: Physical Medicine & Rehabilitation

## 2012-07-11 VITALS — BP 173/111 | HR 111 | Resp 14 | Ht 61.0 in | Wt 124.0 lb

## 2012-07-11 DIAGNOSIS — M47816 Spondylosis without myelopathy or radiculopathy, lumbar region: Secondary | ICD-10-CM

## 2012-07-11 DIAGNOSIS — M4316 Spondylolisthesis, lumbar region: Secondary | ICD-10-CM

## 2012-07-11 DIAGNOSIS — G8929 Other chronic pain: Secondary | ICD-10-CM | POA: Insufficient documentation

## 2012-07-11 DIAGNOSIS — M549 Dorsalgia, unspecified: Secondary | ICD-10-CM | POA: Insufficient documentation

## 2012-07-11 DIAGNOSIS — IMO0001 Reserved for inherently not codable concepts without codable children: Secondary | ICD-10-CM | POA: Insufficient documentation

## 2012-07-11 DIAGNOSIS — M4310 Spondylolisthesis, site unspecified: Secondary | ICD-10-CM

## 2012-07-11 DIAGNOSIS — Z96649 Presence of unspecified artificial hip joint: Secondary | ICD-10-CM | POA: Insufficient documentation

## 2012-07-11 DIAGNOSIS — M47817 Spondylosis without myelopathy or radiculopathy, lumbosacral region: Secondary | ICD-10-CM | POA: Insufficient documentation

## 2012-07-11 DIAGNOSIS — M25559 Pain in unspecified hip: Secondary | ICD-10-CM | POA: Insufficient documentation

## 2012-07-11 MED ORDER — TRAMADOL HCL 50 MG PO TABS: 50.0000 mg | ORAL_TABLET | Freq: Three times a day (TID) | ORAL | Status: DC | PRN

## 2012-07-11 NOTE — Progress Notes (Signed)
Subjective:    Patient ID: Stephanie Martinez, female    DOB: 03/16/1922, 79 y.o.   MRN: [id number]  HPI MBB very helpful for 3 weeks, then wore off Trigger point injections were not very helpful Pain Inventory Average Pain 8 Pain Right Now 4 My pain is intermittent, sharp, dull, stabbing and aching  In the last 24 hours, has pain interfered with the following? General activity 2 Relation with others 5 Enjoyment of life 4 What TIME of day is your pain at its worst? evening and night Sleep (in general) Good  Pain is worse with: walking and standing Pain improves with: medication and injections Relief from Meds: 9  Mobility walk with assistance use a walker ability to climb steps?  no do you drive?  no use a wheelchair transfers alone Do you have any goals in this area?  yes  Function retired I need assistance with the following:  bathing  Neuro/Psych trouble walking dizziness  Prior Studies Any changes since last visit?  no  Physicians involved in your care Any changes since last visit?  no   Family History  Problem Relation Age of Onset  . Stroke Mother   . Colon cancer Father     dx in 33s  . Lung cancer Sister 65  . Heart disease Neg Hx   . Hyperlipidemia Neg Hx   . Hypertension Neg Hx   . Breast cancer Paternal Aunt     <50  . BRCA 1/2 Daughter     BRCA2 positive  . BRCA 1/2 Daughter     BRCA2 positive  . Breast cancer Sister 66    bilateral breast cancer, 2nd dx at 44  . Colon cancer Sister 78  . Breast cancer Sister 67    bilateral breast cancer; 2nd dx age 65  . Leukemia Other     dx in his 51s   History   Social History  . Marital Status: Married    Spouse Name: N/A    Number of Children: N/A  . Years of Education: N/A   Social History Main Topics  . Smoking status: Former Games developer  . Smokeless tobacco: Never Used  . Alcohol Use: No  . Drug Use: No  . Sexually Active: Not Currently   Other Topics Concern  . None   Social  History Narrative  . None   Past Surgical History  Procedure Date  . Breast surgery   . Appendectomy   . Spine surgery   . Small intestine surgery   . Total hip arthroplasty    Past Medical History  Diagnosis Date  . Glaucoma(365)   . Arthritis   . Hyperlipidemia   . Hypertension   . Meniere disease   . GERD (gastroesophageal reflux disease)   . Breast cancer   . Vertigo   . Colon cancer    BP 173/111  Pulse 111  Resp 14  Ht 5\' 1"  (1.549 m)  Wt 124 lb (56.246 kg)  BMI 23.43 kg/m2  SpO2 93%     Review of Systems  Musculoskeletal: Positive for back pain and gait problem.  Neurological: Positive for dizziness.  All other systems reviewed and are negative.       Objective:   Physical Exam constitutional: She is oriented to person, place, and time. She appears well-developed and well-nourished.  HENT:  Head: Normocephalic and atraumatic.  Eyes: Pupils are equal, round, and reactive to light.  Neck: Normal range of motion.  Musculoskeletal:  Lumbar back: She exhibits decreased range of motion and bony tenderness. She exhibits no tenderness, no edema and no spasm.       Back:       Bilateral PSIS tenderness to palpation as well as L4 paraspinal tenderness bilateral as well as bilateral gluteus maximus tenderness  Neurological: She is alert and oriented to person, place, and time. She has normal strength and normal reflexes. Gait abnormal.       Wider base of support, small step length, uses walker to ambulate Pain with extension greater than with forward flexion. She is reduced lumbar range of motion Fear of falling  Psychiatric: She has a normal mood and affect.       Assessment & Plan:  1. Chronic low back pain. Given her history as well as examination I suspect this is multifactorial. There is no clear signs of neurogenic claudication. This likely is a combination between lumbar spondylosis and myofascial pain syndrome with possible sacroiliac  pain   Will schedule for repeat medial branch blocks Followed by physical therapy  tramadol 50 mg every 8 hours when necessary,She may take this along with one acetaminophen  X-rays reviewed demonstrating degenerative spondylolisthesis L4-L5 and multilevel facet arthrosis as well as severe degenerative disc at L1-L2  Elevated BP noted on Dinamap will recheck manual, patient will need to followup with primary care if remains elevated

## 2012-07-11 NOTE — Patient Instructions (Signed)
Repeat medial branch blocks followed by physical therapy

## 2012-07-27 ENCOUNTER — Encounter: Payer: Medicare Other | Attending: Physical Medicine & Rehabilitation

## 2012-07-27 ENCOUNTER — Ambulatory Visit: Payer: Medicare Other | Admitting: Physical Medicine & Rehabilitation

## 2012-07-27 ENCOUNTER — Encounter: Payer: Self-pay | Admitting: Physical Medicine & Rehabilitation

## 2012-07-27 VITALS — BP 223/109 | HR 102 | Resp 14 | Wt 122.8 lb

## 2012-07-27 DIAGNOSIS — M47817 Spondylosis without myelopathy or radiculopathy, lumbosacral region: Secondary | ICD-10-CM | POA: Insufficient documentation

## 2012-07-27 DIAGNOSIS — IMO0001 Reserved for inherently not codable concepts without codable children: Secondary | ICD-10-CM | POA: Insufficient documentation

## 2012-07-27 DIAGNOSIS — M25559 Pain in unspecified hip: Secondary | ICD-10-CM | POA: Insufficient documentation

## 2012-07-27 DIAGNOSIS — G8929 Other chronic pain: Secondary | ICD-10-CM | POA: Insufficient documentation

## 2012-07-27 DIAGNOSIS — Z96649 Presence of unspecified artificial hip joint: Secondary | ICD-10-CM | POA: Insufficient documentation

## 2012-07-27 DIAGNOSIS — M549 Dorsalgia, unspecified: Secondary | ICD-10-CM | POA: Insufficient documentation

## 2012-07-27 DIAGNOSIS — M47816 Spondylosis without myelopathy or radiculopathy, lumbar region: Secondary | ICD-10-CM | POA: Insufficient documentation

## 2012-08-10 ENCOUNTER — Telehealth: Payer: Self-pay | Admitting: Genetic Counselor

## 2012-08-10 NOTE — Telephone Encounter (Signed)
The mailbox is full and is not accepting anymore messages at this time.

## 2012-08-11 ENCOUNTER — Telehealth: Payer: Self-pay | Admitting: Genetic Counselor

## 2012-08-11 NOTE — Telephone Encounter (Signed)
Revealed negative testing with ATM and BRIP1 VUS'

## 2012-08-17 ENCOUNTER — Ambulatory Visit (HOSPITAL_BASED_OUTPATIENT_CLINIC_OR_DEPARTMENT_OTHER): Payer: Medicare Other | Admitting: Physical Medicine & Rehabilitation

## 2012-08-17 ENCOUNTER — Encounter: Payer: Self-pay | Admitting: Physical Medicine & Rehabilitation

## 2012-08-17 VITALS — BP 187/87 | HR 98 | Resp 14 | Ht 61.0 in | Wt 122.4 lb

## 2012-08-17 DIAGNOSIS — M47817 Spondylosis without myelopathy or radiculopathy, lumbosacral region: Secondary | ICD-10-CM

## 2012-08-17 DIAGNOSIS — M47816 Spondylosis without myelopathy or radiculopathy, lumbar region: Secondary | ICD-10-CM

## 2012-08-17 NOTE — Progress Notes (Signed)
Xray 05/25/12 IMPRESSION:  Severe multilevel lumbar spondylosis with mild levoconvex  scoliosis. Degenerative grade 1 anterolisthesis of L4 on L5. No  compression fracture or acute osseous abnormality.   Bilateral Lumbar L3, L4  medial branch blocks and L 5 dorsal ramus injection under fluoroscopic guidance  Indication: Lumbar pain which is not relieved by medication management or other conservative care and interfering with self-care and mobility.  Informed consent was obtained after describing risks and benefits of the procedure with the patient, this includes bleeding, infection, paralysis and medication side effects.  The patient wishes to proceed and has given written consent.  The patient was placed in prone position.  The lumbar area was marked and prepped with Betadine.  One mL of 1% lidocaine was injected into each of 6 areas into the skin and subcutaneous tissue.  Then a 22-gauge 3.5  spinal needle was inserted targeting the junction of the left S1 superior articular process and sacral ala junction. Needle was advanced under fluoroscopic guidance.  Bone contact was made.  Omnipaque 180 was injected x 0.5 mL demonstrating no intravascular uptake.  Then a solution containing one mL of 4 mg per mL dexamethasone and 3 mL of 2% MPF lidocaine was injected x 0.5 mL.  Then the left L5 superior articular process in transverse process junction was targeted.  Bone contact was made.  Omnipaque 180 was injected x 0.5 mL demonstrating no intravascular uptake. Then a solution containing one mL of 4 mg per mL dexamethasone and 3 mL of 2% MPF lidocaine was injected x 0.5 mL.  Then the left L4 superior articular process in transverse process junction was targeted.  Bone contact was made.  Omnipaque 180 was injected x 0.5 mL demonstrating no intravascular uptake.  Then a solution containing one mL of 4 mg per mL dexamethasone and 3 mL if 2% MPF lidocaine was injected x 0.5 mL.  This same procedure was performed on  the right side using the same needle, technique and injectate.  Patient tolerated procedure well.  Post procedure instructions were given. 

## 2012-08-17 NOTE — Progress Notes (Signed)
  PROCEDURE RECORD [facility]   Name: Stephanie Martinez DOB:09-30-1921 MRN: [id number]  Date:08/17/2012  Physician: Claudette Laws, MD    Nurse/CMA: Shumaker RN  Allergies:  Allergies  Allergen Reactions  . Iodine     Consent Signed: yes  Is patient diabetic? no  CBG today?   Pregnant: no LMP: No LMP recorded. Patient is postmenopausal. (age [age])  Anticoagulants: no Anti-inflammatory: no Antibiotics: no  Procedure: Bilaterl Medial Branch Blocks Position: Prone Start Time: 11:40 End Time: 11:50 Fluoro Time: 25 seconds  RN/CMA Designer, multimedia    Time 11:20 11:55    BP 188/87 194/97    Pulse 98 103    Respirations 14 14    O2 Sat 96 97    S/S 6 6    Pain Level 5/10 4/10     D/C home with her daughter patient A & O X 3, D/C instructions reviewed, and sits independently.

## 2012-08-17 NOTE — Patient Instructions (Signed)
Facet Block A facet block is an injection procedure used to numb nerves near a spinal joint (facet). The injection usually includes a medicine like Novacaine (anesthetic) and a steroid medicine (similar to cortisone). The injections are made directly into the facet joint of the back. They are used for patients with several types of neck or back pain problems (such as worsening arthritis or persistent pain after surgery) that have not been helped with anti-inflammatory medications, exercise programs, physical therapy, and other forms of pain management. Multiple injections may be needed depending on how many joints are involved.  A facet block procedure can be helpful with diagnosis as well as providing therapeutic pain relief. One of three things may happen after the procedure:  The pain does not go away. This can mean that the pain is probably not coming from blocked facet joints. This information is helpful with diagnosis.  The pain goes away and stays away for a few hours but the original pain comes back and does not get better again. This information is also helpful with diagnosis. It can mean that pain is probably coming from the joints; but the steroid was not helpful for longer term pain control.  The pain goes away after the block, then returns later that day, and then gets better again over the next few days. This can mean that the block was helpful for pain control and the steroid had a longer lasting effect. If there is good, lasting benefit from the injections, the block may be repeated from 3 to 5 times. If there is good relief but it is only of short-term benefit, other procedures (such as radiofrequency lesioning) may be considered.  Note: The procedure cannot be performed if you have an active infection, a lesion on or near the area of injection, flu, cold, fever, very high blood pressure or if you are on blood thinners. Please make your doctor aware of any of these conditions. This is for  your safety!  LET YOUR CAREGIVER KNOW ABOUT:   Allergies.  Medications taken including herbs, eye drops, over the counter medications, and creams  Use of steroids (by mouth or creams).  Possible pregnancy, if applicable.  Previous problems with anesthetics or Novocaine.  History of blood clots.  History of bleeding or blood problems.  Previous surgery, particularly of the neck and/or back  Other health problems. RISKS AND COMPLICATIONS These are very uncommon but include:  Bleeding.  Injury to a nerve near the injection site.  Weakness or numbness in areas controlled by nerves near the injection site.  Infection.  Pain at the site of the injection.  Temporary fluid retention in those who are prone to this problem.  Allergic reaction to anesthetics or medicines used during the procedure. Diabetics may have a temporary increase in their blood sugar after any surgical procedure, especially if steroids are used. Stinging/burning of the numbing medicine is the most uncomfortable part of the procedure; however every person's response to any procedure is individual.  BEFORE THE PROCEDURE   Your caregiver will provide instructions about stopping any medication before the procedure.  Unless advised otherwise, if the injections are in your neck, you may take your medications as usual with a sip of water but do not eat or drink for 6 hours before the procedure.  Unless advised otherwise, you may eat, drink and take your medications as usual on the day of the procedure (both before and after) if the injections are to be in your lower back.    There is no other specific preparation necessary unless advised otherwise. PROCEDURE After checking your blood pressure, the procedure will be done in the x-ray (fluoroscopy) room while lying on your stomach. For procedures in the neck, an intravenous line is usually started. The back is then cleansed with an antiseptic soap. Sterile drapes are  placed in this area. The skin is numbed with a local anesthetic. This is felt as a stinging or burning sensation. Using x-ray guidance, needles are then advanced to the appropriate locations. Once the needles are in the proper location, the anesthetic and steroid is injected through the needles and the needles are removed. The skin is then cleansed and bandages are applied. Blood pressure will be checked again, and you will be discharged to leave with your ride after your caregiver says it is okay to go.  AFTER THE PROCEDURE  You may not drive for the remainder of the day after your procedure. An adult must be present to drive you home or to go with you in a taxi or on public transportation. The procedure will be canceled if you do not have a responsible adult with you! This is for your safety.  HOME CARE INSTRUCTIONS   The bandages noted above can be removed on the morning after the procedure.  Resume medications according to your caregiver's instructions.  No heat is to be used near or over the injected area(s) for the remainder of the day.  No tub bath or soaking in water (such as a pool, jacuzzi, etc.) for the remainder of the day.  Some local tenderness may be experienced for a couple of days after the injection. Using an ice pack three or four times a day will help this.  Keep track of the amount of pain relief as well as how long the pain relief lasted. SEEK MEDICAL CARE IF:   There is drainage from the injection site.  Pain is not controlled with medications prescribed.  There is significant bleeding or swelling. SEEK IMMEDIATE MEDICAL CARE IF:   You develop a fever of 101 F (38.3 C) or greater.  Worsening pain, swelling, and/or red streaking develops in the skin around the injection site.  Severe pain develops and cannot be controlled with medications prescribed.  You develop any headache, stiff neck, nausea, vomiting, or your eyes become very sensitive to light.  Weakness  or paralysis develops in arms or legs not present before the procedure.  You develop difficulty urinating or difficulty breathing. Document Released: 10/27/2006 Document Revised: 08/30/2011 Document Reviewed: 10/17/2008 Exit[employer] Patient Information 2013 [employer].  

## 2012-09-12 ENCOUNTER — Ambulatory Visit (HOSPITAL_BASED_OUTPATIENT_CLINIC_OR_DEPARTMENT_OTHER): Payer: Medicare Other | Admitting: Physical Medicine & Rehabilitation

## 2012-09-12 ENCOUNTER — Encounter: Payer: Medicare Other | Attending: Physical Medicine & Rehabilitation

## 2012-09-12 ENCOUNTER — Encounter: Payer: Self-pay | Admitting: Physical Medicine & Rehabilitation

## 2012-09-12 VITALS — BP 204/101 | HR 107 | Resp 14 | Ht 61.0 in | Wt 124.4 lb

## 2012-09-12 DIAGNOSIS — IMO0001 Reserved for inherently not codable concepts without codable children: Secondary | ICD-10-CM | POA: Insufficient documentation

## 2012-09-12 DIAGNOSIS — M47817 Spondylosis without myelopathy or radiculopathy, lumbosacral region: Secondary | ICD-10-CM

## 2012-09-12 DIAGNOSIS — M47816 Spondylosis without myelopathy or radiculopathy, lumbar region: Secondary | ICD-10-CM

## 2012-09-12 DIAGNOSIS — M549 Dorsalgia, unspecified: Secondary | ICD-10-CM | POA: Insufficient documentation

## 2012-09-12 DIAGNOSIS — Z96649 Presence of unspecified artificial hip joint: Secondary | ICD-10-CM | POA: Insufficient documentation

## 2012-09-12 DIAGNOSIS — IMO0002 Reserved for concepts with insufficient information to code with codable children: Secondary | ICD-10-CM

## 2012-09-12 DIAGNOSIS — M25559 Pain in unspecified hip: Secondary | ICD-10-CM | POA: Insufficient documentation

## 2012-09-12 DIAGNOSIS — G8929 Other chronic pain: Secondary | ICD-10-CM | POA: Insufficient documentation

## 2012-09-12 MED ORDER — GABAPENTIN 100 MG PO CAPS: 100.0000 mg | ORAL_CAPSULE | Freq: Three times a day (TID) | ORAL | Status: DC

## 2012-09-12 NOTE — Patient Instructions (Addendum)
Gabapentin which is the same thing as Neurontin This is a nerve pain medicine for that pain that goes down to your ankles If it makes you too drowsy just take it at night before you go to bed Will repeat the back injections in 3 weeks

## 2012-09-12 NOTE — Progress Notes (Signed)
Subjective:    Patient ID: Stephanie Martinez, female    DOB: [birth year], 79 y.o.   MRN: [id number]  HPI Good relief of lumbar pain for at least 2-3 weeks following L3-L4-L5 medial branch blocks. Pain has recurred. Also has pain in the legs. This is mainly in the calves and this is mainly at night No weakness in the legs during the day Pain Inventory Average Pain 5 Pain Right Now 9 My pain is burning and aching  In the last 24 hours, has pain interfered with the following? General activity 5 Relation with others 4 Enjoyment of life 4 What TIME of day is your pain at its worst? night Sleep (in general) Good  Pain is worse with: walking and standing Pain improves with: medication and injections Relief from Meds: 6  Mobility use a walker how many minutes can you walk? 5 ability to climb steps?  no do you drive?  no  Function retired I need assistance with the following:  meal prep, household duties and shopping  Neuro/Psych trouble walking  Prior Studies Any changes since last visit?  no  Physicians involved in your care Any changes since last visit?  no   Family History  Problem Relation Age of Onset  . Stroke Mother   . Colon cancer Father     dx in 96s  . Lung cancer Sister 34  . Heart disease Neg Hx   . Hyperlipidemia Neg Hx   . Hypertension Neg Hx   . Breast cancer Paternal Aunt     <50  . BRCA 1/2 Daughter     BRCA2 positive  . BRCA 1/2 Daughter     BRCA2 positive  . Breast cancer Sister 78    bilateral breast cancer, 2nd dx at 26  . Colon cancer Sister 29  . Breast cancer Sister 57    bilateral breast cancer; 2nd dx age 42  . Leukemia Other     dx in his 74s   History   Social History  . Marital Status: Married    Spouse Name: N/A    Number of Children: N/A  . Years of Education: N/A   Social History Main Topics  . Smoking status: Former Games developer  . Smokeless tobacco: Never Used  . Alcohol Use: No  . Drug Use: No  . Sexually Active: Not  Currently   Other Topics Concern  . None   Social History Narrative  . None   Past Surgical History  Procedure Laterality Date  . Breast surgery    . Appendectomy    . Spine surgery    . Small intestine surgery    . Total hip arthroplasty     Past Medical History  Diagnosis Date  . Glaucoma(365)   . Arthritis   . Hyperlipidemia   . Hypertension   . Meniere disease   . GERD (gastroesophageal reflux disease)   . Breast cancer   . Vertigo   . Colon cancer    BP 204/101  Pulse 107  Resp 14  Ht 5\' 1"  (1.549 m)  Wt 124 lb 6.4 oz (56.427 kg)  BMI 23.52 kg/m2  SpO2 95%    Review of Systems  Cardiovascular: Positive for leg swelling.  Musculoskeletal: Positive for arthralgias and gait problem.  All other systems reviewed and are negative.       Objective:   Physical Exam Head: Normocephalic and atraumatic.  Eyes: Pupils are equal, round, and reactive to light.  Neck: Normal range of motion.  Musculoskeletal:       Lumbar back: She exhibits decreased range of motion and bony tenderness. She exhibits no tenderness, no edema and no spasm.       Back:       Bilateral PSIS tenderness to palpation as well as L4 paraspinal tenderness bilateral as well as bilateral gluteus maximus tenderness  Neurological: She is alert and oriented to person, place, and time. She has normal strength and normal reflexes. Gait abnormal.       Wider base of support, small step length, uses walker to ambulate Pain with extension greater than with forward flexion. She is reduced lumbar range of motion Fear of falling  Psychiatric: She has a normal mood and affect.       Assessment & Plan:  1. Lumbar spondylosisWhich has responded on 2 occasions to lumbar medial branch blocks, good candidate for radiofrequency neurotomy will start out on the right side. 2. Probable neurogenic claudication will trial gabapentin.  Discussed with patient and daughter

## 2012-09-13 ENCOUNTER — Telehealth: Payer: Self-pay | Admitting: Genetic Counselor

## 2012-09-13 ENCOUNTER — Encounter: Payer: Self-pay | Admitting: Genetic Counselor

## 2012-09-13 NOTE — Telephone Encounter (Signed)
Left message for [name] about the result of family study petition.  Asked that she call back.

## 2012-09-14 ENCOUNTER — Telehealth: Payer: Self-pay | Admitting: *Deleted

## 2012-09-14 NOTE — Telephone Encounter (Signed)
Left msg on triage mom BP has been running high. Pain md recommending dr. Yetta Barre to increase or change to something else. Called daughter back inform her pt will need ov before he could change to something else...Raechel Chute

## 2012-09-18 ENCOUNTER — Encounter: Payer: Self-pay | Admitting: Internal Medicine

## 2012-09-18 ENCOUNTER — Other Ambulatory Visit (INDEPENDENT_AMBULATORY_CARE_PROVIDER_SITE_OTHER): Payer: Medicare Other

## 2012-09-18 ENCOUNTER — Ambulatory Visit (INDEPENDENT_AMBULATORY_CARE_PROVIDER_SITE_OTHER): Payer: Medicare Other | Admitting: Internal Medicine

## 2012-09-18 VITALS — BP 180/90 | HR 76 | Temp 98.2°F | Resp 16 | Wt 125.0 lb

## 2012-09-18 DIAGNOSIS — C50912 Malignant neoplasm of unspecified site of left female breast: Secondary | ICD-10-CM

## 2012-09-18 DIAGNOSIS — D510 Vitamin B12 deficiency anemia due to intrinsic factor deficiency: Secondary | ICD-10-CM

## 2012-09-18 DIAGNOSIS — C50919 Malignant neoplasm of unspecified site of unspecified female breast: Secondary | ICD-10-CM

## 2012-09-18 DIAGNOSIS — I10 Essential (primary) hypertension: Secondary | ICD-10-CM

## 2012-09-18 DIAGNOSIS — R7309 Other abnormal glucose: Secondary | ICD-10-CM

## 2012-09-18 LAB — IBC PANEL
Saturation Ratios: 12.6 % — ABNORMAL LOW (ref 20.0–50.0)
Transferrin: 255.9 mg/dL (ref 212.0–360.0)

## 2012-09-18 LAB — CBC WITH DIFFERENTIAL/PLATELET
Basophils Absolute: 0.1 10*3/uL (ref 0.0–0.1)
Basophils Relative: 0.8 % (ref 0.0–3.0)
Eosinophils Absolute: 0.6 10*3/uL (ref 0.0–0.7)
Lymphocytes Relative: 16.5 % (ref 12.0–46.0)
MCHC: 33.7 g/dL (ref 30.0–36.0)
MCV: 87.6 fl (ref 78.0–100.0)
Monocytes Absolute: 0.5 10*3/uL (ref 0.1–1.0)
Neutrophils Relative %: 66.4 % (ref 43.0–77.0)
Platelets: 286 10*3/uL (ref 150.0–400.0)
RBC: 4.18 Mil/uL (ref 3.87–5.11)
RDW: 14.2 % (ref 11.5–14.6)

## 2012-09-18 LAB — COMPREHENSIVE METABOLIC PANEL
ALT: 12 U/L (ref 0–35)
AST: 22 U/L (ref 0–37)
Albumin: 3.6 g/dL (ref 3.5–5.2)
Alkaline Phosphatase: 113 U/L (ref 39–117)
BUN: 19 mg/dL (ref 6–23)
Chloride: 98 mEq/L (ref 96–112)
Potassium: 4.1 mEq/L (ref 3.5–5.1)

## 2012-09-18 LAB — FOLATE: Folate: 9.7 ng/mL (ref >5.9)

## 2012-09-18 LAB — FERRITIN: Ferritin: 25.2 ng/mL (ref 10.0–291.0)

## 2012-09-18 LAB — HEMOGLOBIN A1C: Hgb A1c MFr Bld: 5.7 % (ref 4.6–6.5)

## 2012-09-18 NOTE — Assessment & Plan Note (Signed)
I will check her A1C to see if she has developed DM2 

## 2012-09-18 NOTE — Assessment & Plan Note (Signed)
She has felt a lump in her left breast, my exam is normal today I have asked her to get a f/up mammogram done

## 2012-09-18 NOTE — Patient Instructions (Signed)

## 2012-09-18 NOTE — Assessment & Plan Note (Signed)
I will check her CBC B12 and FOLATE levels today and will address if needed

## 2012-09-18 NOTE — Assessment & Plan Note (Signed)
Her BP has not been well controlled so I have added bystolic to her current regimen Today I will check her lytes and renal function to see if there has been any end organ damage

## 2012-09-18 NOTE — Progress Notes (Signed)
Subjective:    Patient ID: Stephanie Martinez, female    DOB: 17-Aug-1921, 79 y.o.   MRN: [id number]  Hypertension This is a chronic problem. The current episode started more than 1 year ago. The problem has been gradually worsening since onset. The problem is uncontrolled. Associated symptoms include anxiety. Pertinent negatives include no blurred vision, chest pain, headaches, malaise/fatigue, neck pain, orthopnea, palpitations, peripheral edema, PND, shortness of breath or sweats. Agents associated with hypertension include NSAIDs. Past treatments include angiotensin blockers and diuretics. The current treatment provides mild improvement. There are no compliance problems.       Review of Systems  Constitutional: Negative.  Negative for fever, chills, malaise/fatigue, diaphoresis, activity change, appetite change, fatigue and unexpected weight change.  HENT: Negative.  Negative for neck pain.   Eyes: Negative.  Negative for blurred vision.  Respiratory: Negative.  Negative for cough, choking, chest tightness, shortness of breath, wheezing and stridor.   Cardiovascular: Negative.  Negative for chest pain, palpitations, orthopnea, leg swelling and PND.  Gastrointestinal: Negative.  Negative for nausea, vomiting, abdominal pain, diarrhea and constipation.  Endocrine: Negative.   Genitourinary: Negative.   Musculoskeletal: Positive for back pain. Negative for myalgias, joint swelling and gait problem.  Skin: Negative for color change, pallor, rash and wound.  Allergic/Immunologic: Negative.   Neurological: Negative.  Negative for dizziness, tremors, weakness, numbness and headaches.  Hematological: Negative for adenopathy. Does not bruise/bleed easily.  Psychiatric/Behavioral: Negative.        Objective:   Physical Exam  Vitals reviewed. Constitutional: She is oriented to person, place, and time. She appears well-developed and well-nourished. No distress.  HENT:  Head: Normocephalic and  atraumatic.  Mouth/Throat: No oropharyngeal exudate.  Eyes: Conjunctivae are normal. Right eye exhibits no discharge. Left eye exhibits no discharge. No scleral icterus.  Neck: Normal range of motion. Neck supple. No JVD present. No tracheal deviation present. No thyromegaly present.  Cardiovascular: Normal rate, regular rhythm, normal heart sounds and intact distal pulses.  Exam reveals no gallop and no friction rub.   No murmur heard. Pulmonary/Chest: Effort normal and breath sounds normal. No stridor. No respiratory distress. She has no wheezes. She has no rales. Chest wall is not dull to percussion. She exhibits no mass, no tenderness, no bony tenderness, no laceration, no crepitus, no edema, no deformity, no swelling and no retraction. Right breast exhibits no inverted nipple, no mass, no nipple discharge, no skin change and no tenderness. Left breast exhibits no inverted nipple, no mass, no nipple discharge, no skin change and no tenderness. Breasts are symmetrical.  Abdominal: Soft. Bowel sounds are normal. She exhibits no distension and no mass. There is no tenderness. There is no rebound and no guarding.  Musculoskeletal: Normal range of motion. She exhibits no edema and no tenderness.  Lymphadenopathy:    She has no cervical adenopathy.  Neurological: She is oriented to person, place, and time.  Skin: Skin is warm and dry. No rash noted. She is not diaphoretic. No erythema. No pallor.  Psychiatric: She has a normal mood and affect. Her behavior is normal. Judgment and thought content normal.     Lab Results  Component Value Date   WBC 11.2* 02/16/2012   HGB 11.8* 02/16/2012   HCT 33.7* 02/16/2012   PLT 287 02/16/2012   GLUCOSE 99 02/16/2012   CHOL 226* 02/16/2012   TRIG 228* 02/16/2012   HDL 45 02/16/2012   LDLDIRECT 136.1 08/27/2011   LDLCALC 135* 02/16/2012   ALT 12 02/15/2012  AST 21 02/15/2012   NA 141 02/16/2012   K 3.9 02/16/2012   CL 102 02/16/2012   CREATININE 1.32* 02/16/2012    BUN 24* 02/16/2012   CO2 26 02/16/2012   TSH 1.64 08/27/2011   INR 0.96 02/15/2012   HGBA1C 5.4 02/16/2012       Assessment & Plan:

## 2012-09-19 ENCOUNTER — Encounter: Payer: Self-pay | Admitting: Internal Medicine

## 2012-09-21 ENCOUNTER — Other Ambulatory Visit: Payer: Self-pay | Admitting: Internal Medicine

## 2012-09-21 ENCOUNTER — Telehealth: Payer: Self-pay | Admitting: Internal Medicine

## 2012-09-21 NOTE — Telephone Encounter (Signed)
Mammogram was ordered.

## 2012-09-21 NOTE — Telephone Encounter (Signed)
Stephanie Martinez called the breast center to schedule a diagnostic mammogram for the lump in [name]'s breast.  They would not let her schedule without a referral.

## 2012-10-05 ENCOUNTER — Telehealth: Payer: Self-pay | Admitting: Internal Medicine

## 2012-10-05 DIAGNOSIS — C50919 Malignant neoplasm of unspecified site of unspecified female breast: Secondary | ICD-10-CM

## 2012-10-05 DIAGNOSIS — M47816 Spondylosis without myelopathy or radiculopathy, lumbar region: Secondary | ICD-10-CM

## 2012-10-05 NOTE — Telephone Encounter (Signed)
Daughter is requesting to have referral placed for PT with [facility], her mother has her pain injections scheduled for next Tuesday and then it will be OK to start PT

## 2012-10-10 ENCOUNTER — Encounter: Payer: Self-pay | Admitting: Physical Medicine & Rehabilitation

## 2012-10-10 ENCOUNTER — Telehealth: Payer: Self-pay

## 2012-10-10 ENCOUNTER — Encounter: Payer: Medicare Other | Attending: Physical Medicine & Rehabilitation

## 2012-10-10 ENCOUNTER — Ambulatory Visit (HOSPITAL_BASED_OUTPATIENT_CLINIC_OR_DEPARTMENT_OTHER): Payer: Medicare Other | Admitting: Physical Medicine & Rehabilitation

## 2012-10-10 VITALS — BP 169/87 | HR 69 | Resp 14 | Ht 61.0 in | Wt 124.6 lb

## 2012-10-10 DIAGNOSIS — Q762 Congenital spondylolisthesis: Secondary | ICD-10-CM | POA: Insufficient documentation

## 2012-10-10 DIAGNOSIS — C50919 Malignant neoplasm of unspecified site of unspecified female breast: Secondary | ICD-10-CM

## 2012-10-10 DIAGNOSIS — M47817 Spondylosis without myelopathy or radiculopathy, lumbosacral region: Secondary | ICD-10-CM | POA: Insufficient documentation

## 2012-10-10 DIAGNOSIS — M47816 Spondylosis without myelopathy or radiculopathy, lumbar region: Secondary | ICD-10-CM

## 2012-10-10 DIAGNOSIS — M4120 Other idiopathic scoliosis, site unspecified: Secondary | ICD-10-CM | POA: Insufficient documentation

## 2012-10-10 NOTE — Telephone Encounter (Signed)
Nancy notified. No other concerns or questions

## 2012-10-10 NOTE — Telephone Encounter (Signed)
Cleotis Nipper (pt's daughter) notified that PT order has been placed. She is needing a referral to the breast center for the lump that was found in pt's breast. She is a breast cancer survivor. She can not make an appt, referral needed.  Please advise.

## 2012-10-10 NOTE — Patient Instructions (Addendum)
Radiofrequency Lesioning Radiofrequency lesioning is a procedure to relieve pain. The procedure is often used for back, neck, or arm pain. Radiofrequency lesioning uses a specialized machine that creates radio waves to make heat. The heat damages the nerve that carries the pain signal. Pain relief usually lasts 6 months to 1 year.  LET YOUR CAREGIVER KNOW ABOUT:  Allergies to food or medicine.  Medicines taken, including vitamins, herbs, eyedrops, over-the-counter medicines, and creams.  Use of steroids (by mouth or creams).  Previous problems with anesthetics or numbing medicines.  History of bleeding problems or blood clots.  Previous surgery.  Other health problems, including diabetes and kidney problems.  Possibility of pregnancy, if this applies.  Breathing problems and smoking history.  Any recent colds or infections. RISKS AND COMPLICATIONS This procedure is generally safe. The risks and complications depend on what treatment site is used. General complications may include:  Pain or soreness at the injection site.  Infection at the injection site.  Damage to nerves or blood vessels. BEFORE THE PROCEDURE  Ask your caregiver about changing or stopping any medicines you are on before the procedure.  If you take blood thinners, ask if you should stop taking them before the procedure.  You may be asked to wash with an antibiotic soap before the procedure.  Do not eat or drink for 8 hours before your procedure or as told by your caregiver.  Ask your caregiver what time you need to arrive for your procedure.  This is an outpatient procedure. This means you will be able to go home the same day. Make plans for someone to drive you home. PROCEDURE  You will be awake during the procedure. You need to be able to talk to the surgeon during the procedure. However, you might be given medicine to help you relax (sedative).  Medicine to numb the area (local anesthetic) will be  injected.  With the help of a type of X-ray (fluoroscopy), a radio frequency needle will be inserted into the area to be treated. Then, a wire that carries the radio waves (electrode) will be put through the radio frequency needle. An electrical pulse will be sent through the electrode to verify the correct nerve.  You will feel a tingling sensation similar to hitting your "funny bone." You may also have muscle twitching. The tissue around the needle tip is then heated when electric current is passed using the radio frequency machine. This numbs the nerves.  A bandage (dressing) will be put on the area after the procedure is done. AFTER THE PROCEDURE  You will stay in a recovery area until you are awake enough to eat and drink.  Once everything is back to normal, you will be able to go home.  You will need to arrange for someone to drive you home if you received a sedative or pain relieving medicine during the procedure. Document Released: 02/03/2011 Document Revised: 08/30/2011 Document Reviewed: 02/03/2011 [facility] Patient Information 2013 [place], [place].

## 2012-10-10 NOTE — Progress Notes (Signed)
Xray 05/25/12 IMPRESSION:  Severe multilevel lumbar spondylosis with mild levoconvex  scoliosis. Degenerative grade 1 anterolisthesis of L4 on L5. No  compression fracture or acute osseous abnormality.   Bilateral Lumbar L3, L4  medial branch blocks and L 5 dorsal ramus injection under fluoroscopic guidance  Indication: Lumbar pain which is not relieved by medication management or other conservative care and interfering with self-care and mobility.  Informed consent was obtained after describing risks and benefits of the procedure with the patient, this includes bleeding, infection, paralysis and medication side effects.  The patient wishes to proceed and has given written consent.  The patient was placed in prone position.  The lumbar area was marked and prepped with Betadine.  One mL of 1% lidocaine was injected into each of 6 areas into the skin and subcutaneous tissue.  Then a 22-gauge 3.5  spinal needle was inserted targeting the junction of the left S1 superior articular process and sacral ala junction. Needle was advanced under fluoroscopic guidance.  Bone contact was made.  Omnipaque 180 was injected x 0.5 mL demonstrating no intravascular uptake.  Then a solution containing one mL of 4 mg per mL dexamethasone and 3 mL of 2% MPF lidocaine was injected x 0.5 mL.  Then the left L5 superior articular process in transverse process junction was targeted.  Bone contact was made.  Omnipaque 180 was injected x 0.5 mL demonstrating no intravascular uptake. Then a solution containing one mL of 4 mg per mL dexamethasone and 3 mL of 2% MPF lidocaine was injected x 0.5 mL.  Then the left L4 superior articular process in transverse process junction was targeted.  Bone contact was made.  Omnipaque 180 was injected x 0.5 mL demonstrating no intravascular uptake.  Then a solution containing one mL of 4 mg per mL dexamethasone and 3 mL if 2% MPF lidocaine was injected x 0.5 mL.  This same procedure was performed on  the right side using the same needle, technique and injectate.  Patient tolerated procedure well.  Post procedure instructions were given.  If 2 months or less improvement we'll proceed to radiofrequency guarding the right side

## 2012-10-10 NOTE — Progress Notes (Signed)
  PROCEDURE RECORD [facility]   Name: Stephanie Martinez DOB:06/08/1922 MRN: [id number]  Date:10/10/2012  Physician: Claudette Laws, MD    Nurse/CMA: Disha Cottam RN  Allergies:  Allergies  Allergen Reactions  . Iodine     Consent Signed: yes  Is patient diabetic? no  CBG today?  Pregnant: no LMP: No LMP recorded. Patient is postmenopausal. (age 79-55)  Anticoagulants: no Anti-inflammatory: no Antibiotics: no  Procedure: bilateral medial branch block Position: Prone Start Time: 1:19 End Time: 1:29 Fluoro Time: 30 seconds  RN/CMA Designer, multimedia    Time 12:59 1:35    BP 169/87 197/81    Pulse 69 73    Respirations 14 14    O2 Sat 97 98    S/S 6     Pain Level 8-9/10      D/C home with daughter, patient A & O X 3, D/C instructions reviewed, and sits independently.

## 2012-10-10 NOTE — Telephone Encounter (Signed)
PT ordered.

## 2012-10-10 NOTE — Telephone Encounter (Signed)
Phone call from [place] at [facility] [phone]. An order was placed for screening mammogram but pt has a hx of breast cancer with lumpectomy so order needs to be replaced with diagnostic bilateral mammogram dx: hx of breast cancer

## 2012-10-10 NOTE — Telephone Encounter (Signed)
done

## 2012-10-11 ENCOUNTER — Other Ambulatory Visit: Payer: Self-pay | Admitting: Internal Medicine

## 2012-10-11 DIAGNOSIS — C50919 Malignant neoplasm of unspecified site of unspecified female breast: Secondary | ICD-10-CM

## 2012-10-11 NOTE — Telephone Encounter (Signed)
Cara notified.

## 2012-10-14 ENCOUNTER — Other Ambulatory Visit: Payer: Self-pay | Admitting: Internal Medicine

## 2012-10-26 ENCOUNTER — Ambulatory Visit
Admission: RE | Admit: 2012-10-26 | Discharge: 2012-10-26 | Disposition: A | Discharge status: Home / Self Care | Payer: Medicare Other | Source: Ambulatory Visit | Attending: Internal Medicine | Admitting: Internal Medicine

## 2012-10-26 ENCOUNTER — Telehealth: Payer: Self-pay

## 2012-10-26 DIAGNOSIS — C50919 Malignant neoplasm of unspecified site of unspecified female breast: Secondary | ICD-10-CM

## 2012-10-26 NOTE — Telephone Encounter (Signed)
Patients daughter called saying the patient received injections a week ago and had 1 day of relief.  Now her pain in back and has increased.  Patient is having trouble standing, leg pain and has fell.  Patient daughter is at a loss of what to do.  Please advise.

## 2012-10-26 NOTE — Telephone Encounter (Signed)
As per my last note patient should be scheduled for a right lumbar radiofrequency L3-L4-L5. The patient can take Tylenol and to let time 650 mg 4 times a day

## 2012-10-27 NOTE — Telephone Encounter (Signed)
Informed patient daughter that they can try tyelnol 650mg  and can make a follow up appointment if it gets worse.

## 2012-11-01 ENCOUNTER — Ambulatory Visit: Payer: Medicare Other | Admitting: Internal Medicine

## 2012-11-08 ENCOUNTER — Ambulatory Visit (INDEPENDENT_AMBULATORY_CARE_PROVIDER_SITE_OTHER): Payer: Medicare Other | Admitting: Internal Medicine

## 2012-11-08 ENCOUNTER — Encounter: Payer: Self-pay | Admitting: Internal Medicine

## 2012-11-08 VITALS — BP 160/92 | HR 80 | Temp 98.5°F | Resp 16 | Wt 123.0 lb

## 2012-11-08 DIAGNOSIS — M545 Low back pain, unspecified: Secondary | ICD-10-CM

## 2012-11-08 DIAGNOSIS — L259 Unspecified contact dermatitis, unspecified cause: Secondary | ICD-10-CM

## 2012-11-08 DIAGNOSIS — M47816 Spondylosis without myelopathy or radiculopathy, lumbar region: Secondary | ICD-10-CM

## 2012-11-08 DIAGNOSIS — M47817 Spondylosis without myelopathy or radiculopathy, lumbosacral region: Secondary | ICD-10-CM

## 2012-11-08 DIAGNOSIS — I10 Essential (primary) hypertension: Secondary | ICD-10-CM

## 2012-11-08 DIAGNOSIS — M1610 Unilateral primary osteoarthritis, unspecified hip: Secondary | ICD-10-CM

## 2012-11-08 DIAGNOSIS — M169 Osteoarthritis of hip, unspecified: Secondary | ICD-10-CM

## 2012-11-08 DIAGNOSIS — L309 Dermatitis, unspecified: Secondary | ICD-10-CM

## 2012-11-08 MED ORDER — NEBIVOLOL HCL 5 MG PO TABS: 5.0000 mg | ORAL_TABLET | Freq: Every day | ORAL | Status: DC

## 2012-11-08 MED ORDER — CLOBETASOL PROPIONATE 0.05 % EX OINT: TOPICAL_OINTMENT | Freq: Two times a day (BID) | CUTANEOUS | Status: DC

## 2012-11-08 MED ORDER — HYDROCODONE-ACETAMINOPHEN 5-325 MG PO TABS: 1.0000 | ORAL_TABLET | Freq: Three times a day (TID) | ORAL | Status: DC | PRN

## 2012-11-08 NOTE — Assessment & Plan Note (Signed)
She did not get any relief with tramadol She will try norco for pain relief

## 2012-11-08 NOTE — Assessment & Plan Note (Signed)
She will try Norco for the pain

## 2012-11-08 NOTE — Assessment & Plan Note (Signed)
Her BP is not well controlled She will restart Bystolic

## 2012-11-08 NOTE — Progress Notes (Signed)
  Subjective:    Patient ID: Stephanie Martinez, female    DOB: [birth year], 79 y.o.   MRN: [id number]  Hypertension This is a chronic problem. The current episode started more than 1 year ago. The problem is unchanged. The problem is uncontrolled. Pertinent negatives include no anxiety, blurred vision, chest pain, headaches, malaise/fatigue, neck pain, orthopnea, palpitations, peripheral edema, PND, shortness of breath or sweats. Agents associated with hypertension include NSAIDs. Past treatments include angiotensin blockers and diuretics. The current treatment provides moderate improvement. There are no compliance problems.       Review of Systems  Constitutional: Negative.  Negative for malaise/fatigue.  HENT: Negative.  Negative for neck pain.   Eyes: Negative.  Negative for blurred vision.  Respiratory: Negative.  Negative for shortness of breath.   Cardiovascular: Negative.  Negative for chest pain, palpitations, orthopnea, leg swelling and PND.  Gastrointestinal: Negative.  Negative for nausea, vomiting, abdominal pain, diarrhea, constipation and blood in stool.  Endocrine: Negative.   Genitourinary: Negative.   Musculoskeletal: Positive for back pain and arthralgias. Negative for myalgias, joint swelling and gait problem.  Skin: Negative.   Allergic/Immunologic: Negative.   Neurological: Negative.  Negative for dizziness and headaches.  Hematological: Negative for adenopathy. Does not bruise/bleed easily.  Psychiatric/Behavioral: Negative.        Objective:   Physical Exam  Vitals reviewed. Constitutional: She is oriented to person, place, and time. She appears well-developed and well-nourished. No distress.  HENT:  Head: Normocephalic and atraumatic.  Mouth/Throat: Oropharynx is clear and moist. No oropharyngeal exudate.  Eyes: Conjunctivae are normal. Right eye exhibits no discharge. Left eye exhibits no discharge. No scleral icterus.  Neck: Normal range of motion. Neck  supple. No JVD present. No tracheal deviation present. No thyromegaly present.  Cardiovascular: Normal rate, regular rhythm, normal heart sounds and intact distal pulses.  Exam reveals no gallop and no friction rub.   No murmur heard. Pulmonary/Chest: Effort normal and breath sounds normal. No stridor. No respiratory distress. She has no wheezes. She has no rales. She exhibits no tenderness.  Abdominal: Soft. Bowel sounds are normal. She exhibits no distension and no mass. There is no tenderness. There is no rebound and no guarding.  Musculoskeletal: Normal range of motion. She exhibits no edema and no tenderness.  Lymphadenopathy:    She has no cervical adenopathy.  Neurological: She is oriented to person, place, and time.  Skin: Skin is warm and dry. No rash noted. She is not diaphoretic. No erythema. No pallor.  Psychiatric: She has a normal mood and affect. Her behavior is normal. Judgment and thought content normal.      Lab Results  Component Value Date   WBC 7.1 09/18/2012   HGB 12.3 09/18/2012   HCT 36.6 09/18/2012   PLT 286.0 09/18/2012   GLUCOSE 96 09/18/2012   CHOL 226* 02/16/2012   TRIG 228* 02/16/2012   HDL 45 02/16/2012   LDLDIRECT 136.1 08/27/2011   LDLCALC 135* 02/16/2012   ALT 12 09/18/2012   AST 22 09/18/2012   NA 136 09/18/2012   K 4.1 09/18/2012   CL 98 09/18/2012   CREATININE 1.3* 09/18/2012   BUN 19 09/18/2012   CO2 30 09/18/2012   TSH 1.64 08/27/2011   INR 0.96 02/15/2012   HGBA1C 5.7 09/18/2012      Assessment & Plan:

## 2012-11-08 NOTE — Patient Instructions (Signed)

## 2012-12-11 ENCOUNTER — Ambulatory Visit (INDEPENDENT_AMBULATORY_CARE_PROVIDER_SITE_OTHER): Payer: Medicare Other | Admitting: Internal Medicine

## 2012-12-11 ENCOUNTER — Encounter: Payer: Self-pay | Admitting: Internal Medicine

## 2012-12-11 ENCOUNTER — Other Ambulatory Visit: Payer: Medicare Other

## 2012-12-11 VITALS — BP 124/64 | HR 76 | Temp 98.0°F | Resp 16 | Ht 62.0 in | Wt 125.0 lb

## 2012-12-11 DIAGNOSIS — M7021 Olecranon bursitis, right elbow: Secondary | ICD-10-CM | POA: Insufficient documentation

## 2012-12-11 DIAGNOSIS — M7020 Olecranon bursitis, unspecified elbow: Secondary | ICD-10-CM

## 2012-12-11 MED ORDER — METHYLPREDNISOLONE ACETATE 40 MG/ML IJ SUSP
40.0000 mg | Freq: Once | INTRAMUSCULAR | Status: AC
  Administered 2012-12-11: 40 mg via INTRA_ARTICULAR

## 2012-12-11 MED ORDER — METHYLPREDNISOLONE ACETATE 40 MG/ML IJ SUSP: 150.0000 mg | Freq: Once | INTRAMUSCULAR | Status: DC

## 2012-12-11 NOTE — Progress Notes (Signed)
  Subjective:    Patient ID: Stephanie Martinez, female    DOB: [birth year], 79 y.o.   MRN: [id number]  HPI Comments: She returns c/o a swollen right elbow over the last 6 weeks with no trauma or injury. It is not painful and there is no erythema.     Review of Systems  All other systems reviewed and are negative.       Objective:   Physical Exam  Vitals reviewed. Constitutional: She appears well-developed and well-nourished. No distress.  HENT:  Head: Normocephalic and atraumatic.  Mouth/Throat: Oropharynx is clear and moist.  Eyes: Conjunctivae are normal. Right eye exhibits no discharge. Left eye exhibits no discharge. No scleral icterus.  Neck: Normal range of motion. Neck supple. No JVD present. No tracheal deviation present. No thyromegaly present.  Cardiovascular: Normal rate, regular rhythm, normal heart sounds and intact distal pulses.  Exam reveals no gallop and no friction rub.   No murmur heard. Pulmonary/Chest: Breath sounds normal. No stridor. No respiratory distress. She has no wheezes. She has no rales. She exhibits no tenderness.  Abdominal: Soft. Bowel sounds are normal. She exhibits no distension and no mass. There is no tenderness. There is no rebound and no guarding.  Musculoskeletal:       Right elbow: She exhibits effusion. She exhibits normal range of motion, no swelling, no deformity and no laceration. Tenderness found. Olecranon process tenderness noted. No radial head, no medial epicondyle and no lateral epicondyle tenderness noted.  There is an effusion over the rt olecranon bursa.  The area was cleaned with betadine then prepped and draped in sterile fashion, local anesthesia was obtained with lidocaine/2% with epi. Then, a 25 gauge 1.5 in needle was inserted into the bursa and 5 cc of straw colored fluid was easily removed and was sent for a culture. Next, 40 mg of depo-medrol was injected into the bursa. She tolerated this well with no complications or  blood loss.  A dressing was applied.  Lymphadenopathy:    She has no cervical adenopathy.  Skin: She is not diaphoretic.          Assessment & Plan:

## 2012-12-11 NOTE — Patient Instructions (Signed)
Olecranon Bursitis Bursitis is swelling and soreness (inflammation) of a fluid-filled sac (bursa) that covers and protects a joint. Olecranon bursitis occurs over the elbow.  CAUSES Bursitis can be caused by injury, overuse of the joint, arthritis, or infection.  SYMPTOMS   Tenderness, swelling, warmth, or redness over the elbow.  Elbow pain with movement. This is greater with bending the elbow.  Squeaking sound when the bursa is rubbed or moved.  Increasing size of the bursa without pain or discomfort.  Fever with increasing pain and swelling if the bursa becomes infected. HOME CARE INSTRUCTIONS   Put ice on the affected area.  Put ice in a plastic bag.  Place a towel between your skin and the bag.  Leave the ice on for 15-20 minutes each hour while awake. Do this for the first 2 days.  When resting, elevate your elbow above the level of your heart. This helps reduce swelling.  Continue to put the joint through a full range of motion 4 times per day. Rest the injured joint at other times. When the pain lessens, begin normal slow movements and usual activities.  Only take over-the-counter or prescription medicines for pain, discomfort, or fever as directed by your caregiver.  Reduce your intake of milk and related dairy products (cheese, yogurt). They may make your condition worse. SEEK IMMEDIATE MEDICAL CARE IF:   Your pain increases even during treatment.  You have a fever.  You have heat and inflammation over the bursa and elbow.  You have a red line that goes up your arm.  You have pain with movement of your elbow. MAKE SURE YOU:   Understand these instructions.  Will watch your condition.  Will get help right away if you are not doing well or get worse. Document Released: 07/07/2006 Document Revised: 08/30/2011 Document Reviewed: 05/23/2007 [employer] Patient Information 2014 [employer].  

## 2012-12-12 NOTE — Addendum Note (Signed)
Addended by: Marlene Lard on: 12/12/2012 01:18 PM   Modules accepted: Orders

## 2012-12-14 LAB — WOUND CULTURE: Organism ID, Bacteria: NO GROWTH

## 2013-01-05 ENCOUNTER — Ambulatory Visit (INDEPENDENT_AMBULATORY_CARE_PROVIDER_SITE_OTHER)
Admission: RE | Admit: 2013-01-05 | Discharge: 2013-01-05 | Disposition: A | Discharge status: Home / Self Care | Payer: Medicare Other | Source: Ambulatory Visit | Attending: Internal Medicine | Admitting: Internal Medicine

## 2013-01-05 ENCOUNTER — Telehealth: Payer: Self-pay | Admitting: Internal Medicine

## 2013-01-05 ENCOUNTER — Encounter: Payer: Self-pay | Admitting: Internal Medicine

## 2013-01-05 ENCOUNTER — Ambulatory Visit (INDEPENDENT_AMBULATORY_CARE_PROVIDER_SITE_OTHER): Payer: Medicare Other | Admitting: Internal Medicine

## 2013-01-05 VITALS — BP 150/82 | HR 70 | Temp 97.5°F | Resp 12 | Wt 121.5 lb

## 2013-01-05 DIAGNOSIS — J209 Acute bronchitis, unspecified: Secondary | ICD-10-CM

## 2013-01-05 DIAGNOSIS — J449 Chronic obstructive pulmonary disease, unspecified: Secondary | ICD-10-CM | POA: Insufficient documentation

## 2013-01-05 DIAGNOSIS — R059 Cough, unspecified: Secondary | ICD-10-CM

## 2013-01-05 DIAGNOSIS — R05 Cough: Secondary | ICD-10-CM

## 2013-01-05 MED ORDER — PROMETHAZINE-DM 6.25-15 MG/5ML PO SYRP: 5.0000 mL | ORAL_SOLUTION | Freq: Four times a day (QID) | ORAL | Status: DC | PRN

## 2013-01-05 MED ORDER — CEFUROXIME AXETIL 500 MG PO TABS: 500.0000 mg | ORAL_TABLET | Freq: Two times a day (BID) | ORAL | Status: DC

## 2013-01-05 NOTE — Progress Notes (Signed)
Subjective:    Patient ID: Stephanie Martinez, female    DOB: 1922-06-10, 79 y.o.   MRN: [id number]  Cough This is a new problem. The current episode started 1 to 4 weeks ago. The problem has been gradually worsening. The problem occurs every few hours. The cough is productive of purulent sputum. Associated symptoms include chills, postnasal drip and a sore throat. Pertinent negatives include no chest pain, ear congestion, ear pain, fever, headaches, heartburn, hemoptysis, myalgias, nasal congestion, rash, rhinorrhea, shortness of breath, sweats, weight loss or wheezing. She has tried OTC cough suppressant for the symptoms. The treatment provided mild relief. There is no history of asthma, bronchitis, COPD or pneumonia.      Review of Systems  Constitutional: Positive for chills. Negative for fever, weight loss, diaphoresis, appetite change and fatigue.  HENT: Positive for sore throat and postnasal drip. Negative for ear pain, rhinorrhea, drooling, trouble swallowing, dental problem and sinus pressure.   Eyes: Negative.   Respiratory: Positive for cough. Negative for apnea, hemoptysis, choking, chest tightness, shortness of breath and wheezing.   Cardiovascular: Negative.  Negative for chest pain, palpitations and leg swelling.  Gastrointestinal: Negative.  Negative for heartburn and abdominal pain.  Endocrine: Negative.   Genitourinary: Negative.   Musculoskeletal: Negative.  Negative for myalgias and back pain.  Skin: Negative.  Negative for rash.  Allergic/Immunologic: Negative.   Neurological: Negative.  Negative for headaches.  Hematological: Negative.  Negative for adenopathy. Does not bruise/bleed easily.  Psychiatric/Behavioral: Negative.        Objective:   Physical Exam  Vitals reviewed. Constitutional: She is oriented to person, place, and time. She appears well-developed and well-nourished.  Non-toxic appearance. She does not have a sickly appearance. She does not appear  ill. No distress.  HENT:  Head: Normocephalic and atraumatic.  Mouth/Throat: Oropharynx is clear and moist. No oropharyngeal exudate.  Eyes: Conjunctivae are normal. Right eye exhibits no discharge. Left eye exhibits no discharge. No scleral icterus.  Neck: Normal range of motion. Neck supple. No JVD present. No tracheal deviation present. No thyromegaly present.  Cardiovascular: Normal rate, regular rhythm, normal heart sounds and intact distal pulses.  Exam reveals no gallop and no friction rub.   Pulmonary/Chest: Effort normal and breath sounds normal. No accessory muscle usage or stridor. Not tachypneic. No respiratory distress. She has no decreased breath sounds. She has no wheezes. She has no rhonchi. She has no rales. She exhibits no tenderness.  Abdominal: Soft. Bowel sounds are normal. She exhibits no distension and no mass. There is no tenderness. There is no rebound and no guarding.  Musculoskeletal: Normal range of motion. She exhibits no edema and no tenderness.  Lymphadenopathy:    She has no cervical adenopathy.  Neurological: She is oriented to person, place, and time.  Skin: Skin is warm and dry. No rash noted. She is not diaphoretic. No erythema. No pallor.  Psychiatric: She has a normal mood and affect. Her behavior is normal. Judgment and thought content normal.     Lab Results  Component Value Date   WBC 7.1 09/18/2012   HGB 12.3 09/18/2012   HCT 36.6 09/18/2012   PLT 286.0 09/18/2012   GLUCOSE 96 09/18/2012   CHOL 226* 02/16/2012   TRIG 228* 02/16/2012   HDL 45 02/16/2012   LDLDIRECT 136.1 08/27/2011   LDLCALC 135* 02/16/2012   ALT 12 09/18/2012   AST 22 09/18/2012   NA 136 09/18/2012   K 4.1 09/18/2012   CL 98 09/18/2012  CREATININE 1.3* 09/18/2012   BUN 19 09/18/2012   CO2 30 09/18/2012   TSH 1.64 08/27/2011   INR 0.96 02/15/2012   HGBA1C 5.7 09/18/2012       Assessment & Plan:

## 2013-01-05 NOTE — Assessment & Plan Note (Signed)
Start ceftin for the infection and a cough suppressant for symptom relief

## 2013-01-05 NOTE — Assessment & Plan Note (Signed)
I will check her CXR to look for pna, mass, edema 

## 2013-01-05 NOTE — Patient Instructions (Signed)

## 2013-01-05 NOTE — Telephone Encounter (Signed)
Patient Information:  Caller Name: [name]  Phone: [phone]  Patient: Stephanie Martinez, Stephanie Martinez  Gender: Female  DOB: 1921-12-13  Age: 79 Years  PCP: Sanda Linger (Adults only)  Office Follow Up:  Does the office need to follow up with this patient?: No  Instructions For The Office: N/A   Symptoms  Reason For Call & Symptoms: [name] has had a cough  x 2 weeks. Productive yellow sputum. C/o " rib pain due to pain from coughing". Cough exhausting her. No other cold symptoms. Per cough protocol has see provider today in  office disposition . Appointment scheduled in EPIC for 15:30 on 01/05/13.  Reviewed Health History In EMR: Yes  Reviewed Medications In EMR: Yes  Reviewed Allergies In EMR: Yes  Reviewed Surgeries / Procedures: Yes  Date of Onset of Symptoms: 12/22/2012  Treatments Tried: Dayquil, Nyquil  Treatments Tried Worked: No  Guideline(s) Used:  Cough  Disposition Per Guideline:   See Today in Office  Reason For Disposition Reached:   Severe coughing spells (e.g., whooping sound after coughing, vomiting after coughing)  Advice Given:  N/A  Patient Will Follow Care Advice:  YES  Appointment Scheduled:  01/05/2013 15:00:00 Appointment Scheduled Provider:  Sanda Linger (Adults only)

## 2013-01-08 NOTE — Progress Notes (Signed)
Advised pts daughter of MDs result note

## 2013-01-10 ENCOUNTER — Ambulatory Visit: Payer: Medicare Other | Admitting: Internal Medicine

## 2013-01-11 ENCOUNTER — Ambulatory Visit: Payer: Medicare Other | Admitting: Physical Medicine & Rehabilitation

## 2013-01-16 ENCOUNTER — Telehealth: Payer: Self-pay | Admitting: Internal Medicine

## 2013-01-16 ENCOUNTER — Encounter: Payer: Self-pay | Admitting: Genetic Counselor

## 2013-01-16 ENCOUNTER — Telehealth: Payer: Self-pay | Admitting: Genetic Counselor

## 2013-01-16 NOTE — Telephone Encounter (Signed)
Notified [name], the patient's daughter that the BRIP1 VUS has been downgraded to likely benign.  We will send out another letter with an updated report.

## 2013-01-16 NOTE — Telephone Encounter (Signed)
Pt was seen 1-2 weeks ago for bronchitis.  She was much better.  The cough returned 2 days ago.  No fever.  She is out of the cough syrup and the antibiotics.  Could she have refills?   [facility] on [address] [place].

## 2013-01-17 NOTE — Telephone Encounter (Signed)
No additional meds for now Will recheck her when she comes in next week

## 2013-01-17 NOTE — Telephone Encounter (Signed)
Left a detailed message on Stephanie Martinez' vm to inform.

## 2013-01-22 ENCOUNTER — Encounter: Payer: Self-pay | Admitting: Internal Medicine

## 2013-01-22 ENCOUNTER — Ambulatory Visit (INDEPENDENT_AMBULATORY_CARE_PROVIDER_SITE_OTHER): Payer: Medicare Other | Admitting: Internal Medicine

## 2013-01-22 VITALS — BP 148/90 | HR 73 | Temp 98.4°F | Resp 16 | Wt 124.0 lb

## 2013-01-22 DIAGNOSIS — J309 Allergic rhinitis, unspecified: Secondary | ICD-10-CM

## 2013-01-22 DIAGNOSIS — Z23 Encounter for immunization: Secondary | ICD-10-CM

## 2013-01-22 DIAGNOSIS — J441 Chronic obstructive pulmonary disease with (acute) exacerbation: Secondary | ICD-10-CM

## 2013-01-22 MED ORDER — DEXAMETHASONE 1.5 MG PO KIT: 1.0000 | PACK | Freq: Every day | ORAL | Status: DC

## 2013-01-22 MED ORDER — BUDESONIDE-FORMOTEROL FUMARATE 80-4.5 MCG/ACT IN AERO: 2.0000 | INHALATION_SPRAY | Freq: Two times a day (BID) | RESPIRATORY_TRACT | Status: DC

## 2013-01-22 MED ORDER — MOMETASONE FUROATE 50 MCG/ACT NA SUSP: 4.0000 | Freq: Every day | NASAL | Status: DC

## 2013-01-22 NOTE — Progress Notes (Signed)
Subjective:    Patient ID: Stephanie Martinez, female    DOB: 03-Aug-1921, 79 y.o.   MRN: [id number]  Cough This is a recurrent problem. The current episode started more than 1 month ago. The problem has been unchanged. The problem occurs every few hours. The cough is productive of sputum. Associated symptoms include nasal congestion, postnasal drip, rhinorrhea, shortness of breath and wheezing. Pertinent negatives include no chest pain, chills, ear congestion, ear pain, fever, headaches, heartburn, hemoptysis, myalgias, rash, sore throat, sweats or weight loss. Nothing aggravates the symptoms. She has tried prescription cough suppressant for the symptoms. The treatment provided moderate relief. Her past medical history is significant for bronchitis, COPD and emphysema. There is no history of asthma, bronchiectasis, environmental allergies or pneumonia.      Review of Systems  Constitutional: Negative.  Negative for fever, chills, weight loss, diaphoresis, appetite change and fatigue.  HENT: Positive for hearing loss, congestion, rhinorrhea and postnasal drip. Negative for ear pain, nosebleeds, sore throat, sneezing, trouble swallowing, voice change and sinus pressure.   Eyes: Negative.   Respiratory: Positive for cough, shortness of breath and wheezing. Negative for hemoptysis.   Cardiovascular: Negative.  Negative for chest pain, palpitations and leg swelling.  Gastrointestinal: Negative.  Negative for heartburn and abdominal pain.  Endocrine: Negative.   Genitourinary: Negative.   Musculoskeletal: Negative.  Negative for myalgias.  Skin: Negative.  Negative for rash.  Allergic/Immunologic: Negative.  Negative for environmental allergies.  Neurological: Negative.  Negative for dizziness, weakness, light-headedness and headaches.  Hematological: Negative.   Psychiatric/Behavioral: Negative.        Objective:   Physical Exam  Vitals reviewed. Constitutional: She is oriented to person,  place, and time. She appears well-developed and well-nourished.  Non-toxic appearance. She does not have a sickly appearance. She does not appear ill. No distress.  HENT:  Head: Normocephalic and atraumatic. No trismus in the jaw.  Right Ear: Hearing, tympanic membrane, external ear and ear canal normal.  Left Ear: Hearing, tympanic membrane, external ear and ear canal normal.  Nose: Mucosal edema and rhinorrhea present. No sinus tenderness or nasal deformity.  No foreign bodies. Right sinus exhibits no maxillary sinus tenderness and no frontal sinus tenderness. Left sinus exhibits no maxillary sinus tenderness and no frontal sinus tenderness.  Mouth/Throat: Oropharynx is clear and moist and mucous membranes are normal. Mucous membranes are not pale, not dry and not cyanotic. No oral lesions. No edematous. No oropharyngeal exudate, posterior oropharyngeal edema, posterior oropharyngeal erythema or tonsillar abscesses.  Eyes: Conjunctivae are normal. Right eye exhibits no discharge. Left eye exhibits no discharge. No scleral icterus.  Neck: Normal range of motion. Neck supple. No JVD present. No tracheal deviation present. No thyromegaly present.  Cardiovascular: Normal rate, regular rhythm, normal heart sounds and intact distal pulses.  Exam reveals no gallop and no friction rub.   No murmur heard. Pulmonary/Chest: Effort normal and breath sounds normal. No stridor. No respiratory distress. She has no wheezes. She has no rales. She exhibits no tenderness.  Abdominal: Soft. Bowel sounds are normal. She exhibits no distension and no mass. There is no tenderness. There is no rebound and no guarding.  Musculoskeletal: Normal range of motion. She exhibits no edema and no tenderness.  Lymphadenopathy:    She has no cervical adenopathy.  Neurological: She is oriented to person, place, and time.  Skin: Skin is warm and dry. No rash noted. She is not diaphoretic. No erythema. No pallor.  Psychiatric: She  has a  normal mood and affect. Her behavior is normal. Judgment and thought content normal.     Lab Results  Component Value Date   WBC 7.1 09/18/2012   HGB 12.3 09/18/2012   HCT 36.6 09/18/2012   PLT 286.0 09/18/2012   GLUCOSE 96 09/18/2012   CHOL 226* 02/16/2012   TRIG 228* 02/16/2012   HDL 45 02/16/2012   LDLDIRECT 136.1 08/27/2011   LDLCALC 135* 02/16/2012   ALT 12 09/18/2012   AST 22 09/18/2012   NA 136 09/18/2012   K 4.1 09/18/2012   CL 98 09/18/2012   CREATININE 1.3* 09/18/2012   BUN 19 09/18/2012   CO2 30 09/18/2012   TSH 1.64 08/27/2011   INR 0.96 02/15/2012   HGBA1C 5.7 09/18/2012       Assessment & Plan:

## 2013-01-22 NOTE — Patient Instructions (Signed)

## 2013-01-23 ENCOUNTER — Encounter: Payer: Self-pay | Admitting: Internal Medicine

## 2013-01-23 NOTE — Assessment & Plan Note (Signed)
She is having a flare so I have asked her to treat with a dexpak and to start using symbicort

## 2013-01-23 NOTE — Assessment & Plan Note (Signed)
She is having a flare of s/s Will treat with a dexpak and will start nasonex ns

## 2013-01-26 ENCOUNTER — Ambulatory Visit: Payer: Medicare Other | Admitting: Internal Medicine

## 2013-03-15 ENCOUNTER — Encounter: Payer: Self-pay | Admitting: Internal Medicine

## 2013-03-15 ENCOUNTER — Ambulatory Visit (INDEPENDENT_AMBULATORY_CARE_PROVIDER_SITE_OTHER): Payer: Medicare Other | Admitting: Internal Medicine

## 2013-03-15 ENCOUNTER — Other Ambulatory Visit: Payer: Self-pay | Admitting: Internal Medicine

## 2013-03-15 VITALS — BP 144/100 | HR 95 | Temp 97.5°F | Wt 126.5 lb

## 2013-03-15 DIAGNOSIS — R7309 Other abnormal glucose: Secondary | ICD-10-CM

## 2013-03-15 DIAGNOSIS — Z23 Encounter for immunization: Secondary | ICD-10-CM

## 2013-03-15 DIAGNOSIS — R059 Cough, unspecified: Secondary | ICD-10-CM

## 2013-03-15 DIAGNOSIS — I10 Essential (primary) hypertension: Secondary | ICD-10-CM

## 2013-03-15 DIAGNOSIS — R05 Cough: Secondary | ICD-10-CM

## 2013-03-15 DIAGNOSIS — J309 Allergic rhinitis, unspecified: Secondary | ICD-10-CM

## 2013-03-15 MED ORDER — METHYLPREDNISOLONE ACETATE 80 MG/ML IJ SUSP
80.0000 mg | Freq: Once | INTRAMUSCULAR | Status: AC
  Administered 2013-03-15: 80 mg via INTRAMUSCULAR

## 2013-03-15 MED ORDER — PREDNISONE 10 MG PO TABS: ORAL_TABLET | ORAL | Status: DC

## 2013-03-15 MED ORDER — HYDROCODONE-HOMATROPINE 5-1.5 MG/5ML PO SYRP: 5.0000 mL | ORAL_SOLUTION | Freq: Four times a day (QID) | ORAL | Status: DC | PRN

## 2013-03-15 NOTE — Assessment & Plan Note (Signed)
Etiology unclear, but suspect bronchitis +/- allergy related; to cont PPI as is, last cxr July 2014 neg for acute, to cont inhaler and nasal steroid and PPI, but also antibx, low prednisone, and cough med prn

## 2013-03-15 NOTE — Patient Instructions (Signed)
You had the steroid shot today Please take all new medication as prescribed  - the antibiotic, cough med and low dose prednisone Please continue all other medications as before, including the inhaler and nasal spray  Please remember to sign up for My Chart if you have not done so, as this will be important to you in the future with finding out test results, communicating by private email, and scheduling acute appointments online when needed.

## 2013-03-15 NOTE — Progress Notes (Signed)
Subjective:    Patient ID: Stephanie Martinez, female    DOB: [birth year], 79 y.o.   MRN: [id number]  HPI   Here with acute onset mild to mod 2-3 days ST, HA, general weakness and malaise, with prod cough, but Pt denies fever, chest pain, increased sob or doe, wheezing, orthopnea, PND, increased LE swelling, palpitations, dizziness or syncope except for onset mild wheezing last night with mild sob.  July 18 cxr neg for acute.  Current meds not enough.  OTC antihist not helping.  Has recent URI, cough resolved with antibx, now 2 wks later seems to be back Past Medical History  Diagnosis Date  . Glaucoma   . Arthritis   . Hyperlipidemia   . Hypertension   . Meniere disease   . GERD (gastroesophageal reflux disease)   . Breast cancer   . Vertigo   . Colon cancer    Past Surgical History  Procedure Laterality Date  . Breast surgery    . Appendectomy    . Spine surgery    . Small intestine surgery    . Total hip arthroplasty      reports that she has quit smoking. She has never used smokeless tobacco. She reports that she does not drink alcohol or use illicit drugs. family history includes BRCA 1/2 in her daughter and daughter; Breast cancer in her paternal aunt; Breast cancer (age of onset: 25) in her sister; Breast cancer (age of onset: 36) in her sister; Colon cancer in her father; Colon cancer (age of onset: 24) in her sister; Leukemia in her other; Lung cancer (age of onset: 36) in her sister; Stroke in her mother. There is no history of Heart disease, Hyperlipidemia, or Hypertension. Allergies  Allergen Reactions  . Iodine    Current Outpatient Prescriptions on File Prior to Visit  Medication Sig Dispense Refill  . budesonide-formoterol (SYMBICORT) 80-4.5 MCG/ACT inhaler Inhale 2 puffs into the lungs 2 (two) times daily.  1 Inhaler  3  . calcium citrate-vitamin D (CITRACAL+D) 315-200 MG-UNIT per tablet Take 1 tablet by mouth daily.      . clobetasol ointment (TEMOVATE) 0.05 % Apply  topically 2 (two) times daily.  45 g  3  . Dexamethasone (DEXPAK 6 DAY) 1.5 MG KIT Take 1 kit (1.5 mg total) by mouth daily.  1 kit  0  . gabapentin (NEURONTIN) 100 MG capsule Take 1 capsule (100 mg total) by mouth 3 (three) times daily.  90 capsule  1  . HYDROcodone-acetaminophen (NORCO/VICODIN) 5-325 MG per tablet Take 1 tablet by mouth every 8 (eight) hours as needed for pain.  60 tablet  2  . ibuprofen (ADVIL,MOTRIN) 200 MG tablet Take 200 mg by mouth every 4 (four) hours.      Marland Kitchen latanoprost (XALATAN) 0.005 % ophthalmic solution Place 1 drop into both eyes at bedtime.       . Lutein 20 MG TABS Take by mouth daily.      . mometasone (NASONEX) 50 MCG/ACT nasal spray Place 4 sprays into the nose daily.  17 g  12  . nebivolol (BYSTOLIC) 5 MG tablet Take 1 tablet (5 mg total) by mouth daily.  90 tablet  3  . omeprazole (PRILOSEC) 20 MG capsule TAKE 1 CAPSULE BY MOUTH DAILY  90 capsule  3  . promethazine-dextromethorphan (PROMETHAZINE-DM) 6.25-15 MG/5ML syrup Take 5 mLs by mouth 4 (four) times daily as needed for cough.  118 mL  0  . Red Yeast Rice Extract (RED YEAST  RICE PO) Take by mouth.       No current facility-administered medications on file prior to visit.   Review of Systems  Constitutional: Negative for unexpected weight change, or unusual diaphoresis  HENT: Negative for tinnitus.   Eyes: Negative for photophobia and visual disturbance.  Respiratory: Negative for choking and stridor.   Gastrointestinal: Negative for vomiting and blood in stool.  Genitourinary: Negative for hematuria and decreased urine volume.  Musculoskeletal: Negative for acute joint swelling Skin: Negative for color change and wound.  Neurological: Negative for tremors and numbness other than noted  Psychiatric/Behavioral: Negative for decreased concentration or  hyperactivity.       Objective:   Physical Exam BP 144/100  Pulse 95  Temp(Src) 97.5 F (36.4 C) (Oral)  Wt 126 lb 8 oz (57.38 kg)  BMI 23.13  kg/m2  SpO2 97% VS noted, mild ill Constitutional: Pt appears well-developed and well-nourished.  HENT: Head: NCAT.  Right Ear: External ear normal.  Left Ear: External ear normal.  Bilat tm's with mild erythema.  Max sinus areas non tender.  Pharynx with mild erythema, no exudate Eyes: Conjunctivae and EOM are normal. Pupils are equal, round, and reactive to light.  Neck: Normal range of motion. Neck supple.  Cardiovascular: Normal rate and regular rhythm.   Pulmonary/Chest: Effort normal and breath sounds decreased bilat Neurological: Pt is alert. Not confused  Skin: Skin is warm. No erythema.  Psychiatric: Pt behavior is normal. Thought content normal.     Assessment & Plan:

## 2013-03-18 NOTE — Assessment & Plan Note (Signed)
elev today liikely situationa,, o/w stable overall by history and exam, recent data reviewed with pt, and pt to continue medical treatment as before,  to f/u any worsening symptoms or concerns BP Readings from Last 3 Encounters:  03/15/13 144/100  01/22/13 148/90  01/05/13 150/82

## 2013-03-18 NOTE — Assessment & Plan Note (Signed)
stable overall by history and exam and pt to continue medical treatment as before,  to f/u any worsening symptoms or concerns, for f/u with any onset polys

## 2013-03-18 NOTE — Assessment & Plan Note (Signed)
Also likely to improve at least temporarily with steroid tx,  to f/u any worsening symptoms or concerns

## 2013-03-19 ENCOUNTER — Ambulatory Visit (HOSPITAL_BASED_OUTPATIENT_CLINIC_OR_DEPARTMENT_OTHER): Payer: Medicare Other | Admitting: Physical Medicine & Rehabilitation

## 2013-03-19 ENCOUNTER — Encounter: Payer: Medicare Other | Attending: Physical Medicine & Rehabilitation

## 2013-03-19 ENCOUNTER — Encounter: Payer: Self-pay | Admitting: Physical Medicine & Rehabilitation

## 2013-03-19 VITALS — BP 144/100 | HR 65 | Resp 14 | Wt 123.2 lb

## 2013-03-19 DIAGNOSIS — M47817 Spondylosis without myelopathy or radiculopathy, lumbosacral region: Secondary | ICD-10-CM

## 2013-03-19 DIAGNOSIS — M5430 Sciatica, unspecified side: Secondary | ICD-10-CM | POA: Insufficient documentation

## 2013-03-19 DIAGNOSIS — M47816 Spondylosis without myelopathy or radiculopathy, lumbar region: Secondary | ICD-10-CM

## 2013-03-19 DIAGNOSIS — M4316 Spondylolisthesis, lumbar region: Secondary | ICD-10-CM

## 2013-03-19 DIAGNOSIS — IMO0002 Reserved for concepts with insufficient information to code with codable children: Secondary | ICD-10-CM | POA: Insufficient documentation

## 2013-03-19 DIAGNOSIS — M4310 Spondylolisthesis, site unspecified: Secondary | ICD-10-CM | POA: Insufficient documentation

## 2013-03-19 DIAGNOSIS — M5431 Sciatica, right side: Secondary | ICD-10-CM

## 2013-03-19 MED ORDER — GABAPENTIN 100 MG PO CAPS: 100.0000 mg | ORAL_CAPSULE | Freq: Three times a day (TID) | ORAL | Status: DC

## 2013-03-19 NOTE — Patient Instructions (Addendum)
Will restart Gabapentin  Ok to start after prednisone  Recheck in one month, if no better will need to repeat MRI and do Epidural

## 2013-03-19 NOTE — Progress Notes (Signed)
Subjective:    Patient ID: Stephanie Martinez, female    DOB: 15-Jan-1922, 79 y.o.   MRN: [id number]  HPI Back pain improved after chiropractic treatment   R hip and back.that radiates to ankles Doing better since being on prednisone for respiratory problem.  Pain Inventory Average Pain 7 Pain Right Now 2 My pain is constant, dull and aching  In the last 24 hours, has pain interfered with the following? General activity 1 Relation with others 1 Enjoyment of life 1 What TIME of day is your pain at its worst? evening Sleep (in general) Poor  Pain is worse with: sitting and some activites Pain improves with: rest and medication Relief from Meds: 2  Mobility walk with assistance use a walker ability to climb steps?  no do you drive?  no  Function retired  Neuro/Psych weakness numbness trouble walking dizziness  Prior Studies Any changes since last visit?  no  Physicians involved in your care Any changes since last visit?  no   Family History  Problem Relation Age of Onset  . Stroke Mother   . Colon cancer Father     dx in 10s  . Lung cancer Sister 47  . Heart disease Neg Hx   . Hyperlipidemia Neg Hx   . Hypertension Neg Hx   . Breast cancer Paternal Aunt     <50  . BRCA 1/2 Daughter     BRCA2 positive  . BRCA 1/2 Daughter     BRCA2 positive  . Breast cancer Sister 30    bilateral breast cancer, 2nd dx at 70  . Colon cancer Sister 73  . Breast cancer Sister 41    bilateral breast cancer; 2nd dx age 58  . Leukemia Other     dx in his 68s   History   Social History  . Marital Status: Married    Spouse Name: N/A    Number of Children: N/A  . Years of Education: N/A   Social History Main Topics  . Smoking status: Former Games developer  . Smokeless tobacco: Never Used  . Alcohol Use: No  . Drug Use: No  . Sexual Activity: Not Currently   Other Topics Concern  . None   Social History Narrative  . None   Past Surgical History  Procedure  Laterality Date  . Breast surgery    . Appendectomy    . Spine surgery    . Small intestine surgery    . Total hip arthroplasty     Past Medical History  Diagnosis Date  . Glaucoma   . Arthritis   . Hyperlipidemia   . Hypertension   . Meniere disease   . GERD (gastroesophageal reflux disease)   . Breast cancer   . Vertigo   . Colon cancer    BP 144/100  Pulse 65  Resp 14  Wt 123 lb 3.2 oz (55.883 kg)  BMI 22.53 kg/m2  SpO2 99%    Review of Systems  Musculoskeletal: Positive for gait problem.  Neurological: Positive for dizziness, weakness and numbness.  All other systems reviewed and are negative.       Objective:   Physical Exam  Nursing note and vitals reviewed. Constitutional: She appears well-developed and well-nourished.  HENT:  Head: Normocephalic and atraumatic.  Eyes: Conjunctivae and EOM are normal. Pupils are equal, round, and reactive to light.  Neurological: She is alert. No sensory deficit. She exhibits normal muscle tone. Gait abnormal.  Reflex Scores:  Patellar reflexes are 2+ on the right side and 2+ on the left side.      Achilles reflexes are 1+ on the right side and 1+ on the left side. Right ankle dorsiflexor weakness 4/5 Otherwise 5/5 in BLE   Psychiatric: She has a normal mood and affect.    Stasis dermatitis changes       Assessment & Plan:  1.  Lumbar radiculitis R L4-5, has anterolisthesis at that level.  Better on oral prednisone,  Restart Gabapentin, RTC one month.  If no better consider MRI and poss ESI

## 2013-04-17 ENCOUNTER — Encounter: Payer: Self-pay | Admitting: Physical Medicine & Rehabilitation

## 2013-04-17 ENCOUNTER — Ambulatory Visit (HOSPITAL_BASED_OUTPATIENT_CLINIC_OR_DEPARTMENT_OTHER): Payer: Medicare Other | Admitting: Physical Medicine & Rehabilitation

## 2013-04-17 ENCOUNTER — Encounter: Payer: Medicare Other | Attending: Physical Medicine & Rehabilitation

## 2013-04-17 VITALS — BP 158/78 | HR 72 | Resp 14 | Ht 62.0 in | Wt 126.0 lb

## 2013-04-17 DIAGNOSIS — M4310 Spondylolisthesis, site unspecified: Secondary | ICD-10-CM | POA: Insufficient documentation

## 2013-04-17 DIAGNOSIS — IMO0002 Reserved for concepts with insufficient information to code with codable children: Secondary | ICD-10-CM

## 2013-04-17 DIAGNOSIS — M47817 Spondylosis without myelopathy or radiculopathy, lumbosacral region: Secondary | ICD-10-CM | POA: Insufficient documentation

## 2013-04-17 DIAGNOSIS — M5416 Radiculopathy, lumbar region: Secondary | ICD-10-CM

## 2013-04-17 DIAGNOSIS — M5430 Sciatica, unspecified side: Secondary | ICD-10-CM | POA: Insufficient documentation

## 2013-04-17 DIAGNOSIS — M47816 Spondylosis without myelopathy or radiculopathy, lumbar region: Secondary | ICD-10-CM

## 2013-04-17 DIAGNOSIS — M48062 Spinal stenosis, lumbar region with neurogenic claudication: Secondary | ICD-10-CM

## 2013-04-17 MED ORDER — TRAMADOL-ACETAMINOPHEN 37.5-325 MG PO TABS: 1.0000 | ORAL_TABLET | Freq: Three times a day (TID) | ORAL | Status: DC | PRN

## 2013-04-17 NOTE — Progress Notes (Signed)
Subjective:    Patient ID: Stephanie Martinez, female    DOB: Oct 11, 1921, 79 y.o.   MRN: [id number]  HPI R hip and back.that radiates Recurrent, had temporary relief while on prednisone for COPD  Tried gabapentin. Side effects of dizziness and nausea Pain is from the right hip radiating toward the knee. Taking hydrocodone 2 tablets per day Pain Inventory Average Pain 3 Pain Right Now 7 My pain is dull and stabbing  In the last 24 hours, has pain interfered with the following? General activity 1 Relation with others 1 Enjoyment of life 1 What TIME of day is your pain at its worst? evening Sleep (in general) Fair  Pain is worse with: some activites Pain improves with: medication Relief from Meds: 8  Mobility use a walker ability to climb steps?  no do you drive?  no needs help with transfers  Function retired  Neuro/Psych weakness numbness trouble walking dizziness  Prior Studies Any changes since last visit?  no  Physicians involved in your care Any changes since last visit?  no   Family History  Problem Relation Age of Onset  . Stroke Mother   . Colon cancer Father     dx in 15s  . Lung cancer Sister 36  . Heart disease Neg Hx   . Hyperlipidemia Neg Hx   . Hypertension Neg Hx   . Breast cancer Paternal Aunt     <50  . BRCA 1/2 Daughter     BRCA2 positive  . BRCA 1/2 Daughter     BRCA2 positive  . Breast cancer Sister 56    bilateral breast cancer, 2nd dx at 59  . Colon cancer Sister 68  . Breast cancer Sister 37    bilateral breast cancer; 2nd dx age 33  . Leukemia Other     dx in his 69s   History   Social History  . Marital Status: Married    Spouse Name: N/A    Number of Children: N/A  . Years of Education: N/A   Social History Main Topics  . Smoking status: Former Games developer  . Smokeless tobacco: Never Used  . Alcohol Use: No  . Drug Use: No  . Sexual Activity: Not Currently   Other Topics Concern  . None   Social History  Narrative  . None   Past Surgical History  Procedure Laterality Date  . Breast surgery    . Appendectomy    . Spine surgery    . Small intestine surgery    . Total hip arthroplasty     Past Medical History  Diagnosis Date  . Glaucoma   . Arthritis   . Hyperlipidemia   . Hypertension   . Meniere disease   . GERD (gastroesophageal reflux disease)   . Breast cancer   . Vertigo   . Colon cancer    BP 163/115  Pulse 72  Resp 14  Ht 5\' 2"  (1.575 m)  Wt 126 lb (57.153 kg)  BMI 23.04 kg/m2  SpO2 97%     Review of Systems  Respiratory: Positive for cough.   Musculoskeletal: Positive for gait problem.  Neurological: Positive for dizziness, weakness and numbness.  All other systems reviewed and are negative.       Objective:   Physical Exam  Nursing note and vitals reviewed. Constitutional: She is oriented to person, place, and time. She appears well-developed and well-nourished.  HENT:  Head: Normocephalic and atraumatic.  Eyes: Conjunctivae and EOM are normal. Pupils  are equal, round, and reactive to light.  Musculoskeletal:  -SLR + right fem stretch test  Neurological: She is alert and oriented to person, place, and time. She has normal strength and normal reflexes. A sensory deficit is present.  Decreased sensation R L3 and 4 dermatomal distribution  Psychiatric: She has a normal mood and affect.          Assessment & Plan:  1.  R L3 radic will check MRI to eval etiology given age and neuro signs, consider ESI Add gabapentin, trial of tramadol

## 2013-04-17 NOTE — Patient Instructions (Signed)
New medicine and tramadol take 3 times a day MRI ordered review results, next visit will be for epidural if the MRI demonstrates nerve root problem

## 2013-04-25 ENCOUNTER — Other Ambulatory Visit (HOSPITAL_COMMUNITY): Payer: Medicare Other

## 2013-04-25 ENCOUNTER — Ambulatory Visit (HOSPITAL_COMMUNITY): Admission: RE | Admit: 2013-04-25 | Payer: Medicare Other | Source: Ambulatory Visit

## 2013-04-25 ENCOUNTER — Telehealth: Payer: Self-pay | Admitting: *Deleted

## 2013-04-25 NOTE — Telephone Encounter (Signed)
Needs something for her MRI this afternoon.

## 2013-04-26 NOTE — Telephone Encounter (Signed)
Left voicemail to return call to clinic to see if patient still needed the Valium or if she had the MRI already.

## 2013-04-26 NOTE — Telephone Encounter (Signed)
May give Valium 5mg  #1 prior to MRI

## 2013-05-01 ENCOUNTER — Telehealth: Payer: Self-pay | Admitting: *Deleted

## 2013-05-01 MED ORDER — DIAZEPAM 5 MG PO TABS: 5.0000 mg | ORAL_TABLET | Freq: Once | ORAL | Status: DC

## 2013-05-01 NOTE — Telephone Encounter (Signed)
Stephanie Martinez daughter called about getting the premed for the MRI so her mother could hvar the test.  She was unable to have the procedure before because of having no order for the pre med.  Called to [employer] [facility].  Ardenia could you please reschedule the MRI for Stephanie Martinez again.  The pre med was not available for the last one.  They would like Tues or Thursday late.  Daughters number is  [phone]

## 2013-05-03 ENCOUNTER — Ambulatory Visit (HOSPITAL_COMMUNITY)
Admission: RE | Admit: 2013-05-03 | Discharge: 2013-05-03 | Disposition: A | Discharge status: Home / Self Care | Payer: Medicare Other | Source: Ambulatory Visit | Attending: Physical Medicine & Rehabilitation | Admitting: Physical Medicine & Rehabilitation

## 2013-05-03 DIAGNOSIS — M5124 Other intervertebral disc displacement, thoracic region: Secondary | ICD-10-CM | POA: Insufficient documentation

## 2013-05-03 DIAGNOSIS — M4120 Other idiopathic scoliosis, site unspecified: Secondary | ICD-10-CM | POA: Insufficient documentation

## 2013-05-03 DIAGNOSIS — M47817 Spondylosis without myelopathy or radiculopathy, lumbosacral region: Secondary | ICD-10-CM | POA: Insufficient documentation

## 2013-05-03 DIAGNOSIS — M48062 Spinal stenosis, lumbar region with neurogenic claudication: Secondary | ICD-10-CM

## 2013-05-24 ENCOUNTER — Encounter: Payer: Medicare Other | Attending: Physical Medicine & Rehabilitation

## 2013-05-24 ENCOUNTER — Encounter: Payer: Self-pay | Admitting: Physical Medicine & Rehabilitation

## 2013-05-24 ENCOUNTER — Ambulatory Visit (HOSPITAL_BASED_OUTPATIENT_CLINIC_OR_DEPARTMENT_OTHER): Payer: Medicare Other | Admitting: Physical Medicine & Rehabilitation

## 2013-05-24 VITALS — BP 162/81 | HR 69 | Resp 14 | Ht 63.0 in | Wt 124.6 lb

## 2013-05-24 DIAGNOSIS — M5430 Sciatica, unspecified side: Secondary | ICD-10-CM | POA: Insufficient documentation

## 2013-05-24 DIAGNOSIS — IMO0002 Reserved for concepts with insufficient information to code with codable children: Secondary | ICD-10-CM | POA: Insufficient documentation

## 2013-05-24 DIAGNOSIS — M47817 Spondylosis without myelopathy or radiculopathy, lumbosacral region: Secondary | ICD-10-CM | POA: Insufficient documentation

## 2013-05-24 DIAGNOSIS — M4310 Spondylolisthesis, site unspecified: Secondary | ICD-10-CM | POA: Insufficient documentation

## 2013-05-24 DIAGNOSIS — M48061 Spinal stenosis, lumbar region without neurogenic claudication: Secondary | ICD-10-CM

## 2013-05-24 NOTE — Patient Instructions (Signed)
Moderate to severe lumbar spinal stenosis from L1-S1 levels. Because of low pain levels over last 2 weeks will not do injection. I would not be surprised if you have a flareup of back pain or pain shooting into your legs. Please give a call if that is the case. You may benefit from oral steroids and if that doesn't work schedule you for epidural injection

## 2013-05-24 NOTE — Progress Notes (Addendum)
  PROCEDURE RECORD [facility]   Name: KATY BRICKELL DOB:April 27, 1922 MRN: [id number]  Date:05/24/2013  Physician: Claudette Laws, MD    Nurse/CMA: Berle Mull, CMA  Allergies:  Allergies  Allergen Reactions  . Iodine     Consent Signed: yes  Is patient diabetic? no    Pregnant: no LMP: No LMP recorded. Patient is postmenopausal. (age [age])  Anticoagulants: no Anti-inflammatory: no Antibiotics: no  Procedure: Transforaminal  Position: Prone Start Time:   End Time:   Fluoro Time:   RN/CMA [place], CMA     Time 941     BP 162/81     Pulse 69     Respirations 14     O2 Sat 96     S/S 6 6    Pain Level 3/10 3/10     D/C home with daughter, patient A & O X 3, D/C instructions reviewed, and sits independently.

## 2013-05-24 NOTE — Progress Notes (Signed)
Scheduled for epidural steroid injection. MRI reviewed. Multilevel foraminal and central stenosis. Patient states that over the last 2 weeks for pain has just been mild. Pain is made around 3/10.  Pain is mainly in the back we can repeat medial branch blocks please call  If shooting into the legs more we can do a epidural  Discussed findings with daughter and patient. Discussed recommendations. Return to clinic 3 months

## 2013-06-24 ENCOUNTER — Other Ambulatory Visit: Payer: Self-pay | Admitting: Internal Medicine

## 2013-06-29 ENCOUNTER — Other Ambulatory Visit: Payer: Self-pay | Admitting: Internal Medicine

## 2013-08-20 ENCOUNTER — Ambulatory Visit: Payer: Medicare Other | Admitting: Physical Medicine & Rehabilitation

## 2013-09-07 ENCOUNTER — Ambulatory Visit (INDEPENDENT_AMBULATORY_CARE_PROVIDER_SITE_OTHER): Payer: Medicare Other | Admitting: Internal Medicine

## 2013-09-07 ENCOUNTER — Encounter: Payer: Self-pay | Admitting: Internal Medicine

## 2013-09-07 VITALS — BP 140/80 | HR 68 | Temp 97.8°F | Resp 14 | Wt 126.0 lb

## 2013-09-07 DIAGNOSIS — R799 Abnormal finding of blood chemistry, unspecified: Secondary | ICD-10-CM

## 2013-09-07 DIAGNOSIS — R7989 Other specified abnormal findings of blood chemistry: Secondary | ICD-10-CM

## 2013-09-07 DIAGNOSIS — N898 Other specified noninflammatory disorders of vagina: Secondary | ICD-10-CM

## 2013-09-07 DIAGNOSIS — R82998 Other abnormal findings in urine: Secondary | ICD-10-CM

## 2013-09-07 DIAGNOSIS — R8290 Unspecified abnormal findings in urine: Secondary | ICD-10-CM

## 2013-09-07 DIAGNOSIS — R6883 Chills (without fever): Secondary | ICD-10-CM

## 2013-09-07 DIAGNOSIS — L293 Anogenital pruritus, unspecified: Secondary | ICD-10-CM

## 2013-09-07 NOTE — Progress Notes (Signed)
   Subjective:    Patient ID: Stephanie Martinez, female    DOB: 1921/08/31, 80 y.o.   MRN: [id number]  HPI Within the last two weeks, she has experienced 4-5 episodes of chills, sweats, with diffuse myalgias/arthralgias.  These episodes last from 15 minutes to one hour. She denies cardiac or neural prodrome, although reports feelings of heart racing and muscle pain after each episode. No seizure activity described. After the first episode, she vomited. No emesis with other occurrences.  Significantly, she has experienced 10 syncopal episodes within last 5-6 years. Work-ups have been inconclusive as to the reasons for these events.  Her daughter reports that she has history of true vertigo, managed with head position maneuvers, and Meniere's disease.   For the last 4-5 months, she has used Vagisil daily for vaginal itching and burning. She describes scratching to the point of drawing blood at some times.   Urine has "metallic odor" intermittently. Last creatinine 1.3 in 08/2012.    Review of Systems No associated fever or weight loss are described.  Chest pain, palpitations, edema, calf pain, or claudication are denied.  Cough, dyspnea, sputum production, or hemoptysis are absent.  There is no associated back pain with anterior radiation of discomfort.  No associated weakness, numbness/tingling in arms.  Absent are dyspepsia, significant reflux or dysphagia.  No changes in skin temperature or color in the area of the symptoms.  Rash or skin lesions absent.  No history of abnormal bruising or bleeding.       Objective:   Physical Exam General appearance is one of good health and nourishment w/o distress. Appears younger than stated age Eyes: No conjunctival inflammation or scleral icterus is present.  Oral exam: Dental hygiene is good; lips and gums are healthy appearing.There is no oropharyngeal erythema or exudate noted.  Heart: Normal rate and regular rhythm. S1 and S2 normal  without gallop, murmur, click, rub or other extra sounds  Thyroid: normal Lungs:Low grade bibasilar rales; no wheezes, rhonchi,or rubs present.No increased work of breathing.  Abdomen: bowel sounds normal, soft and non-tender without masses, organomegaly or hernias noted. No guarding or rebound . No tenderness over the flanks to percussion  Skin:Warm & dry. Intact without suspicious lesions or rashes ; no jaundice or tenting  Lymphatic: No lymphadenopathy is noted about the head, neck, axillary areas.      Assessment & Plan:  #1 intermittent shaking chills  #2 abnormal urine odor  #3 vaginal itching with scratching to point of bleeding R/O transitory sepsis in context of #1 & #3 #4 elevated creatinine  Plan: See orders Gyn referral; her daughter has seen Dr Edwinna Areola

## 2013-09-07 NOTE — Patient Instructions (Addendum)
Your next office appointment will be determined based upon review of your pending labs. Those instructions will be transmitted to you through My Chart .  Followup as needed for your acute issue. Please report any significant change in your symptoms.  Please monitor temperature at the onset of  the chills and after these resolve.

## 2013-09-07 NOTE — Progress Notes (Signed)
Pre visit review using our clinic review tool, if applicable. No additional management support is needed unless otherwise documented below in the visit note. 

## 2013-09-07 NOTE — Progress Notes (Signed)
   Subjective:    Patient ID: Stephanie Martinez, female    DOB: [birth year], 80 y.o.   MRN: [id number]  HPI Within the last two weeks, she has experienced 4-5 episodes of chills, sweats, with all over body aches.  These episodes last from 15 minutes to one hour. She denies cardiac or neural prodrome, although reports feelings of heart racing and muscle pain after each episode.  After the first episode, she vomited. No emesis with other occurrences.   Significantly, she has experienced 10 syncopal episodes within last 5-6 years. Work-ups have been inconclusive as to the reasons for these events.  Her daughter reports that she has history of true vertigo, managed with Eply (?) maneuver and Meniere's disease.    Review of Systems No associated fever or weight loss are described. Chest pain, palpitations, edema, calf pain, or claudication are denied. Cough, dyspnea, sputum production, or hemoptysis are absent. There is no associated back pain with anterior radiation of discomfort. No associated weakness, numbness/tingling in arms. Absent are dyspepsia, significant reflux or dysphagia. No changes in skin temperature or color in the area of the symptoms. Rash or skin lesions absent. No history of abnormal bruising or bleeding.    Objective:   Physical Exam  General appearance is one of good health and nourishment w/o distress.  Eyes: No conjunctival inflammation or scleral icterus is present.  Oral exam: Dental hygiene is good; lips and gums are healthy appearing.There is no oropharyngeal erythema or exudate noted.   Heart:  Normal rate and regular rhythm. S1 and S2 normal without gallop, murmur, click, rub or other extra sounds     Lungs:Chest clear to auscultation; no wheezes, rhonchi,rales ,or rubs present.No increased work of breathing.   Abdomen: bowel sounds normal, soft and non-tender without masses, organomegaly or hernias noted.  No guarding or rebound . No tenderness over the  flanks to percussion  Musculoskeletal: Able to lie flat and sit up without help. Negative straight leg raising bilaterally. Gait normal  Skin:Warm & dry.  Intact without suspicious lesions or rashes ; no jaundice or tenting  Lymphatic: No lymphadenopathy is noted about the head, neck, axilla, or inguinal areas.      Assessment & Plan:  Hypoglycemia v seizure activity?  CBC c diff, TSH

## 2013-09-10 ENCOUNTER — Other Ambulatory Visit (INDEPENDENT_AMBULATORY_CARE_PROVIDER_SITE_OTHER): Payer: Medicare Other

## 2013-09-10 DIAGNOSIS — R7989 Other specified abnormal findings of blood chemistry: Secondary | ICD-10-CM

## 2013-09-10 DIAGNOSIS — R6883 Chills (without fever): Secondary | ICD-10-CM

## 2013-09-10 DIAGNOSIS — R82998 Other abnormal findings in urine: Secondary | ICD-10-CM

## 2013-09-10 DIAGNOSIS — R799 Abnormal finding of blood chemistry, unspecified: Secondary | ICD-10-CM

## 2013-09-10 DIAGNOSIS — R8290 Unspecified abnormal findings in urine: Secondary | ICD-10-CM

## 2013-09-10 LAB — URINALYSIS, ROUTINE W REFLEX MICROSCOPIC
BILIRUBIN URINE: NEGATIVE
Ketones, ur: NEGATIVE
NITRITE: NEGATIVE
Specific Gravity, Urine: 1.015 (ref 1.000–1.030)
TOTAL PROTEIN, URINE-UPE24: NEGATIVE
UROBILINOGEN UA: 0.2 (ref 0.0–1.0)
Urine Glucose: NEGATIVE
pH: 5.5 (ref 5.0–8.0)

## 2013-09-10 LAB — CBC WITH DIFFERENTIAL/PLATELET
BASOS ABS: 0 10*3/uL (ref 0.0–0.1)
BASOS PCT: 0.1 % (ref 0.0–3.0)
EOS ABS: 0.8 10*3/uL — AB (ref 0.0–0.7)
Eosinophils Relative: 5.3 % — ABNORMAL HIGH (ref 0.0–5.0)
HCT: 35.9 % — ABNORMAL LOW (ref 36.0–46.0)
Hemoglobin: 12.1 g/dL (ref 12.0–15.0)
LYMPHS PCT: 7.5 % — AB (ref 12.0–46.0)
Lymphs Abs: 1.1 10*3/uL (ref 0.7–4.0)
MCHC: 33.8 g/dL (ref 30.0–36.0)
MCV: 93.5 fl (ref 78.0–100.0)
MONO ABS: 0.8 10*3/uL (ref 0.1–1.0)
Monocytes Relative: 5.5 % (ref 3.0–12.0)
NEUTROS PCT: 81.6 % — AB (ref 43.0–77.0)
Neutro Abs: 12.1 10*3/uL — ABNORMAL HIGH (ref 1.4–7.7)
PLATELETS: 356 10*3/uL (ref 150.0–400.0)
RBC: 3.84 Mil/uL — ABNORMAL LOW (ref 3.87–5.11)
RDW: 12.1 % (ref 11.5–14.6)
WBC: 14.9 10*3/uL — ABNORMAL HIGH (ref 4.5–10.5)

## 2013-09-10 LAB — BASIC METABOLIC PANEL
BUN: 31 mg/dL — AB (ref 6–23)
CALCIUM: 9.5 mg/dL (ref 8.4–10.5)
CO2: 25 meq/L (ref 19–32)
CREATININE: 1.6 mg/dL — AB (ref 0.4–1.2)
Chloride: 101 mEq/L (ref 96–112)
GFR: 32.91 mL/min — ABNORMAL LOW (ref >60.00)
Glucose, Bld: 100 mg/dL — ABNORMAL HIGH (ref 70–99)
Potassium: 3.3 mEq/L — ABNORMAL LOW (ref 3.5–5.1)
Sodium: 136 mEq/L (ref 135–145)

## 2013-09-10 LAB — TSH: TSH: 2.89 u[IU]/mL (ref 0.35–5.50)

## 2013-09-10 NOTE — Addendum Note (Signed)
Addended by: Harl Bowie on: 09/10/2013 11:45 AM   Modules accepted: Orders

## 2013-09-12 ENCOUNTER — Ambulatory Visit (INDEPENDENT_AMBULATORY_CARE_PROVIDER_SITE_OTHER): Payer: Medicare Other | Admitting: Obstetrics & Gynecology

## 2013-09-12 ENCOUNTER — Other Ambulatory Visit: Payer: Self-pay | Admitting: Internal Medicine

## 2013-09-12 ENCOUNTER — Encounter: Payer: Self-pay | Admitting: Obstetrics & Gynecology

## 2013-09-12 VITALS — BP 118/78 | HR 64 | Resp 16 | Ht 61.25 in | Wt 124.0 lb

## 2013-09-12 DIAGNOSIS — L309 Dermatitis, unspecified: Secondary | ICD-10-CM

## 2013-09-12 DIAGNOSIS — L259 Unspecified contact dermatitis, unspecified cause: Secondary | ICD-10-CM

## 2013-09-12 DIAGNOSIS — N390 Urinary tract infection, site not specified: Secondary | ICD-10-CM

## 2013-09-12 DIAGNOSIS — L293 Anogenital pruritus, unspecified: Secondary | ICD-10-CM

## 2013-09-12 DIAGNOSIS — L292 Pruritus vulvae: Secondary | ICD-10-CM

## 2013-09-12 LAB — URINE CULTURE: Colony Count: >=100000

## 2013-09-12 MED ORDER — SULFAMETHOXAZOLE-TMP DS 800-160 MG PO TABS: ORAL_TABLET | ORAL | Status: DC

## 2013-09-12 MED ORDER — CLOBETASOL PROPIONATE 0.05 % EX OINT: TOPICAL_OINTMENT | Freq: Two times a day (BID) | CUTANEOUS | Status: AC

## 2013-09-12 MED ORDER — VALSARTAN-HYDROCHLOROTHIAZIDE 320-25 MG PO TABS: ORAL_TABLET | ORAL | Status: DC

## 2013-09-12 NOTE — Progress Notes (Signed)
80 y.o. G2P2 WidowedCaucasianF here long history of vulvar itching.  This has been long standing for years.  She has used many OTC products for this.  Most recently she used Clobetasol for a day but didn't think it helps.  Does wear a minipad every day due to itching and she uses vagisil wipes every day.  No vaginal bleeding.  Another, unrelated issue, is that she has been experiencing for two weeks episodes of shaking.  Comes out of nowhere and then she just shakes for 30 minutes.  Feels completely exhausted after this occurs.  Saw Dr. Linna Darner on Friday and blood work was done.  Reviewed with patient and daughter.  E. Coli UTI present with elevated WBC count.  She also has a decreased GFR.  Patient's daughter feels this is new.  Daughter was called while in my office and informed they needed to follow up in about a week.  Advised I will treat the UTI and she will need repeat culture in about two weeks.  Can be done here or with Dr. Ronnald Ramp or Dr. Linna Darner.   Denies dysuria.  +flank pain.  No fevers.  No hematuria.  Is leaking a little more urine than normal.  No LMP recorded. Patient is postmenopausal.          Sexually active: no  The current method of family planning is none.    Exercising: no  not recently Smoker:  Former smoker  Health Maintenance: Pap:  3 1/2- 4 years ago History of abnormal Pap:  no MMG:  10/26/12 diag in one year Colonoscopy:  4-5 years ago  BMD:   Years ago-osteoporosis TDaP:  1/10 Screening Labs: PCP, Hb today: PCP, Urine today: PCP   reports that she has quit smoking. She has never used smokeless tobacco. She reports that she drinks about 2.5 ounces of alcohol per week. She reports that she does not use illicit drugs.  Past Medical History  Diagnosis Date  . Glaucoma   . Arthritis   . Hyperlipidemia   . Hypertension   . Meniere disease   . GERD (gastroesophageal reflux disease)   . Breast cancer     lumpectomy and radiation  . Vertigo     doing PT   . Colon  cancer     resection  . Overactive thyroid gland     treated with liquid iodine  . Osteoporosis     Past Surgical History  Procedure Laterality Date  . Breast surgery      lumpectomy-left  . Appendectomy    . Spine surgery      x2  . Small intestine surgery      resection  . Total hip arthroplasty      bilateral one 20 years ago, other 8-10 years ago  . Tonsillectomy and adenoidectomy  before age 59    Current Outpatient Prescriptions  Medication Sig Dispense Refill  . aspirin 81 MG tablet Take 81 mg by mouth daily.      . clobetasol ointment (TEMOVATE) 0.05 % Apply topically 2 (two) times daily.  45 g  3  . GLUCOSAMINE HCL PO Take 1,000 mg by mouth daily.      Marland Kitchen ibuprofen (ADVIL,MOTRIN) 200 MG tablet Take 200 mg by mouth every 4 (four) hours.      Marland Kitchen latanoprost (XALATAN) 0.005 % ophthalmic solution Place 1 drop into both eyes at bedtime.       . LUTEIN PO Take 25 mg by mouth daily.      Marland Kitchen  nebivolol (BYSTOLIC) 5 MG tablet Take 1 tablet (5 mg total) by mouth daily.  90 tablet  3  . omeprazole (PRILOSEC) 20 MG capsule TAKE 1 CAPSULE BY MOUTH DAILY  90 capsule  3  . Pramoxine HCl (VAGISIL MAXIMUM STRENGTH EX) Apply topically.      . valsartan-hydrochlorothiazide (DIOVAN-HCT) 320-25 MG per tablet 09/12/13 1/2 qd (K+ 3.3, creat 1.6)  90 tablet  1  . vitamin C (ASCORBIC ACID) 500 MG tablet Take 500 mg by mouth daily.      . calcium citrate-vitamin D (CITRACAL+D) 315-200 MG-UNIT per tablet Take 1 tablet by mouth daily.       No current facility-administered medications for this visit.    Family History  Problem Relation Age of Onset  . Stroke Mother   . Colon cancer Father     dx in 23s  . Lung cancer Sister 33  . Heart disease Neg Hx   . Hyperlipidemia Neg Hx   . Hypertension Other     materal side  . Breast cancer Paternal Aunt     <50  . BRCA 1/2 Daughter     BRCA2 positive  . BRCA 1/2 Daughter     BRCA2 positive  . Breast cancer Sister 84    bilateral breast cancer,  2nd dx at 67  . Colon cancer Sister 76  . Breast cancer Sister 63    bilateral breast cancer; 2nd dx age 68  . Leukemia Other     dx in his 53s, youngest sister's son  . Thyroid disease Daughter     ROS:  Pertinent items are noted in HPI.  Otherwise, a comprehensive ROS was negative.  Exam:   BP 118/78  Pulse 64  Resp 16  Ht 5' 1.25" (1.556 m)  Wt 124 lb (56.246 kg)  BMI 23.23 kg/m2   Height: 5' 1.25" (155.6 cm)  T: 98.4 Ht Readings from Last 3 Encounters:  09/12/13 5' 1.25" (1.556 m)  05/24/13 $RemoveB'5\' 3"'wAdNRVLc$  (1.6 m)  04/17/13 $RemoveB'5\' 2"'rFzYLXJy$  (1.575 m)    General appearance: alert, cooperative and appears stated age Head: Normocephalic, without obvious abnormality, atraumatic Extremities: extremities normal, atraumatic, no cyanosis or edema Skin: Skin color, texture, turgor normal. No rashes or lesions Lymph nodes: Cervical, supraclavicular, and axillary nodes normal. No abnormal inguinal nodes palpated Neurologic: Grossly normal   Pelvic: External genitalia:  White, patchy, thickened skin with excoriations from vaginal introitus down towards rectum, extending out onto buttocks.              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  Spoke with pharmacist personally. Repeat urine culture 2 weeks either here or with PCP Clobetasol 0.05% ointment BID until biopsy is back. Stop vagisil!!  D/w pt other products that can exacerbate itching--toilet paper, soap, detergents.  Suggestions made for changing products. An After  Visit Summary was printed and given to the patient.

## 2013-09-13 ENCOUNTER — Telehealth: Payer: Self-pay | Admitting: *Deleted

## 2013-09-13 MED ORDER — SULFAMETHOXAZOLE-TMP DS 800-160 MG PO TABS: 1.0000 | ORAL_TABLET | Freq: Two times a day (BID) | ORAL | Status: DC

## 2013-09-13 NOTE — Telephone Encounter (Signed)
Message copied by Harl Bowie on Thu Sep 13, 2013  1:53 PM ------      Message from: Hendricks Limes      Created: Wed Sep 12, 2013  1:15 PM       Please send new  Rx for generic Septra DS X 10 days.      F/U appt with Dr Ronnald Ramp late next week with BP readings ------

## 2013-09-13 NOTE — Telephone Encounter (Signed)
Spoke with the pt's daughter and she was aware of the pt's UC results.  She stated the pt was seen by GYN on yest and the results were discuss.  Also she had already picked up the ATB, which Dr. Linna Darner called in the pharmacy.  The daughter scheduled the pt a f/u appt with Dr. Ronnald Ramp for next week for her BP readings.//AB/CMA

## 2013-09-17 ENCOUNTER — Telehealth: Payer: Self-pay | Admitting: *Deleted

## 2013-09-17 NOTE — Telephone Encounter (Signed)
Spoke with patients daughter and advised her of recent urine results. She states pt is feeling much better and is scheduled to follow up with Dr. Sabra Heck, GYN on 09/24/13 for repeat of urine cx and bloodwork. Encouraged to keep pt hydrated and to follow up in the office as needed.

## 2013-09-17 NOTE — Telephone Encounter (Signed)
Message copied by Chilton Greathouse on Mon Sep 17, 2013  9:23 AM ------      Message from: Hendricks Limes      Created: Mon Sep 10, 2013  1:09 PM       Abnormal urine; culture requested. ------

## 2013-09-18 ENCOUNTER — Telehealth: Payer: Self-pay

## 2013-09-18 NOTE — Telephone Encounter (Signed)
Patients daughter is calling kelly back

## 2013-09-18 NOTE — Telephone Encounter (Signed)
Message copied by Robley Fries on Tue Sep 18, 2013  9:13 AM ------      Message from: Megan Salon      Created: Fri Sep 14, 2013  5:41 PM       Spoke with pt's daughter.  Advised of results.  Pt is to stop using the vagisil and any possible topical irritant.  Toilet paper, soaps, detergents discussed.  Use clobetasol in AM and vaseline at night.  Needs f/u one month.  Please call and schedule this.  Thank you. ------

## 2013-09-18 NOTE — Telephone Encounter (Signed)
Lmtcb//kn 

## 2013-09-19 ENCOUNTER — Ambulatory Visit: Payer: Medicare Other | Admitting: Internal Medicine

## 2013-09-19 NOTE — Telephone Encounter (Signed)
Patient daughter is calling kelly back

## 2013-09-20 NOTE — Telephone Encounter (Signed)
Patient's daughter "Izora Gala" is returning your call.

## 2013-09-20 NOTE — Telephone Encounter (Signed)
Spoke with Stephanie Martinez, appointment for f/u made.

## 2013-09-24 ENCOUNTER — Encounter: Payer: Self-pay | Admitting: Obstetrics & Gynecology

## 2013-09-24 ENCOUNTER — Ambulatory Visit (INDEPENDENT_AMBULATORY_CARE_PROVIDER_SITE_OTHER): Payer: Medicare Other | Admitting: Obstetrics & Gynecology

## 2013-09-24 VITALS — BP 112/64 | HR 72 | Resp 16 | Ht 61.25 in | Wt 123.0 lb

## 2013-09-24 DIAGNOSIS — M545 Low back pain, unspecified: Secondary | ICD-10-CM

## 2013-09-24 DIAGNOSIS — L280 Lichen simplex chronicus: Secondary | ICD-10-CM

## 2013-09-24 NOTE — Patient Instructions (Addendum)
Continue clobetasol twice daily for two weeks.  Then decrease to once daily.  IF you want to use something at night at that point, you can start using a little Vaseline.

## 2013-09-24 NOTE — Progress Notes (Signed)
Subjective:     Patient ID: [name], female   DOB: 1921/12/15, 80 y.o.   MRN: [id number]  HPI 80 yo G2P2 WWF here for follow up of vulvar itching and UTI.  Pt has stopped using her vagisil and is using the Clobetasol.  She has one small area of ithcing but otherwise is really so pleased that the itching is almost gone.  Reviewed pathology which showed no atypia or malignancy but possible lichen simplex chronicus.  My feeling is this is a chronic reaction to the vagisil.  She know NEVER to use this again.  Reports the back pain is much better.  Still feels very weak.  No fevers.  The "spells" she was having, which I think were rigors, have completely resolved.  Her daughter feels that patient really has lost some muscle strength due to being in bed for about two weeks before being diagnosed with the UTI.  She just felt so badly then and didn't feel like doing anything.  Wonders about PT for patient.    Review of Systems  Constitutional: Positive for fatigue.  Gastrointestinal: Negative.   Genitourinary: Negative.   Skin: Negative.        Objective:   Physical Exam  Constitutional: She is oriented to person, place, and time. She appears well-developed.  Abdominal: Soft. Bowel sounds are normal.  Genitourinary:     Musculoskeletal: Normal range of motion.  Neurological: She is alert and oriented to person, place, and time.  Skin: Skin is warm and dry.  Psychiatric: She has a normal mood and affect.   Cath u/a performed using sterile technique.    Assessment:     UTI, improved Vulvar itching with biopsy most consistent with Lichen Simplex Chronicus      Plan:     Urine culture and micro pending Will see about PT referral for pt Continue clobetasol 0.05% ointment BID for two more weeks.  Pt then will decrease to am only.  F/U 4-6 weeks.  Will taper further after next visit.

## 2013-09-25 ENCOUNTER — Telehealth: Payer: Self-pay | Admitting: *Deleted

## 2013-09-25 LAB — URINALYSIS, MICROSCOPIC ONLY
Bacteria, UA: NONE SEEN
CASTS: NONE SEEN
CRYSTALS: NONE SEEN
Squamous Epithelial / LPF: NONE SEEN

## 2013-09-25 NOTE — Telephone Encounter (Signed)
Referral faxed to [place], they will call us back with appointment info.

## 2013-09-25 NOTE — Telephone Encounter (Signed)
Referral faxed to [place]: Stephanie Martinez. ([facility] called back and they are full and unable to accept our referral).

## 2013-09-25 NOTE — Telephone Encounter (Signed)
Per ROI on file, call to patient's daughter and LM on VM that referral has been sent.

## 2013-09-26 ENCOUNTER — Other Ambulatory Visit: Payer: Self-pay | Admitting: Internal Medicine

## 2013-09-26 ENCOUNTER — Telehealth: Payer: Self-pay | Admitting: Internal Medicine

## 2013-09-26 DIAGNOSIS — C50919 Malignant neoplasm of unspecified site of unspecified female breast: Secondary | ICD-10-CM

## 2013-09-26 DIAGNOSIS — H8109 Meniere's disease, unspecified ear: Secondary | ICD-10-CM

## 2013-09-26 DIAGNOSIS — J441 Chronic obstructive pulmonary disease with (acute) exacerbation: Secondary | ICD-10-CM

## 2013-09-26 DIAGNOSIS — M47816 Spondylosis without myelopathy or radiculopathy, lumbar region: Secondary | ICD-10-CM

## 2013-09-26 DIAGNOSIS — Z853 Personal history of malignant neoplasm of breast: Secondary | ICD-10-CM

## 2013-09-26 LAB — URINE CULTURE
Colony Count: NO GROWTH
ORGANISM ID, BACTERIA: NO GROWTH

## 2013-09-26 NOTE — Telephone Encounter (Signed)
Call to patient's daughter LMTCB.

## 2013-09-26 NOTE — Telephone Encounter (Signed)
Informed Sally at Dr. Ammie Ferrier office.

## 2013-09-26 NOTE — Telephone Encounter (Signed)
Please advise 

## 2013-09-26 NOTE — Telephone Encounter (Signed)
Call back from [facility], Referral must come from patient's PCP, Dr Louie Bun due to insurance.  Call to Dr Ronnald Ramp office, message with Tammy need referral to Home care.

## 2013-09-26 NOTE — Telephone Encounter (Signed)
Gay Filler at Dr. Sanjuan Dame office called over.  They were trying to get home care for Stephanie Martinez through [facility] (spoke with Maudie Mercury at [phone]).  They can not approve home care b/c they are not primary.  Dr. Sanjuan Dame office is requesting we submit for home care for fatigue and weakness from a UTI that was undiagnosed for two weeks.

## 2013-09-26 NOTE — Telephone Encounter (Signed)
Patient's daughter returned call and update given that referral for home care to [facility] from patient's PCP.  Daughter appreciative.  Routing to provider for final review. Patient agreeable to disposition. Will close encounter

## 2013-09-26 NOTE — Telephone Encounter (Signed)
Order sent.

## 2013-09-26 NOTE — Telephone Encounter (Signed)
Sharyn Lull from Dr Ronnald Ramp office calls and referral for home care has been placed.

## 2013-09-27 NOTE — Telephone Encounter (Signed)
All results given to patients daughter//kn

## 2013-10-16 DIAGNOSIS — I10 Essential (primary) hypertension: Secondary | ICD-10-CM

## 2013-10-16 DIAGNOSIS — H8109 Meniere's disease, unspecified ear: Secondary | ICD-10-CM

## 2013-10-16 DIAGNOSIS — M6281 Muscle weakness (generalized): Secondary | ICD-10-CM

## 2013-10-16 DIAGNOSIS — R269 Unspecified abnormalities of gait and mobility: Secondary | ICD-10-CM

## 2013-10-23 ENCOUNTER — Ambulatory Visit (INDEPENDENT_AMBULATORY_CARE_PROVIDER_SITE_OTHER): Payer: Medicare Other | Admitting: Obstetrics & Gynecology

## 2013-10-23 ENCOUNTER — Encounter: Payer: Self-pay | Admitting: Obstetrics & Gynecology

## 2013-10-23 VITALS — BP 144/90 | HR 68 | Resp 16 | Wt 128.4 lb

## 2013-10-23 DIAGNOSIS — L293 Anogenital pruritus, unspecified: Secondary | ICD-10-CM

## 2013-10-23 DIAGNOSIS — L292 Pruritus vulvae: Secondary | ICD-10-CM

## 2013-10-23 NOTE — Progress Notes (Signed)
Subjective:     Patient ID: [name], female   DOB: 1922-02-15, 80 y.o.   MRN: [id number]  HPI 80 yo G2P2 WWF here for follow up of vulvar itching.  Reports symptoms are completely gone.  Not using the steroid ointment now and not using Vagisil.  She knows I do not want her using Vagisil, at all.   Biopsy done 09/12/13 showed:  HYPERKERATOSIS AND SLIGHT CHRONIC INFLAMMATION.  Was able to start PT. This has really helped.  Daughter from [place] is here with patient today.     Review of Systems  Genitourinary: Negative for vaginal bleeding, vaginal discharge and difficulty urinating.       Objective:   Physical Exam  Constitutional: She is oriented to person, place, and time. She appears well-developed and well-nourished.  Genitourinary: Vagina normal. There is no rash, tenderness or lesion on the right labia. There is no rash, tenderness or lesion on the left labia.  Normal appearing skin changes.  No excoriation.  No skin thickening.  Neurological: She is alert and oriented to person, place, and time.  Skin: Skin is warm and dry.  Psychiatric: She has a normal mood and affect.       Assessment:     Vulvar irritation due to Vagisil use, now resolved     Plan:     Pt to f/u prn.  No new rx's given.

## 2013-11-07 ENCOUNTER — Encounter (INDEPENDENT_AMBULATORY_CARE_PROVIDER_SITE_OTHER): Payer: Self-pay

## 2013-11-07 ENCOUNTER — Ambulatory Visit
Admission: RE | Admit: 2013-11-07 | Discharge: 2013-11-07 | Disposition: A | Discharge status: Home / Self Care | Payer: Medicare Other | Source: Ambulatory Visit | Attending: Internal Medicine | Admitting: Internal Medicine

## 2013-11-07 DIAGNOSIS — Z853 Personal history of malignant neoplasm of breast: Secondary | ICD-10-CM

## 2013-11-07 LAB — HM MAMMOGRAPHY: HM Mammogram: NORMAL

## 2013-11-08 ENCOUNTER — Ambulatory Visit (INDEPENDENT_AMBULATORY_CARE_PROVIDER_SITE_OTHER): Payer: Medicare Other | Admitting: Podiatrist

## 2013-11-08 ENCOUNTER — Ambulatory Visit (INDEPENDENT_AMBULATORY_CARE_PROVIDER_SITE_OTHER): Payer: Medicare Other

## 2013-11-08 ENCOUNTER — Encounter: Payer: Self-pay | Admitting: Podiatrist

## 2013-11-08 VITALS — BP 142/86 | HR 86 | Resp 12

## 2013-11-08 DIAGNOSIS — R52 Pain, unspecified: Secondary | ICD-10-CM

## 2013-11-08 DIAGNOSIS — M2040 Other hammer toe(s) (acquired), unspecified foot: Secondary | ICD-10-CM

## 2013-11-08 DIAGNOSIS — B351 Tinea unguium: Secondary | ICD-10-CM

## 2013-11-08 DIAGNOSIS — M79609 Pain in unspecified limb: Secondary | ICD-10-CM

## 2013-11-08 DIAGNOSIS — L84 Corns and callosities: Secondary | ICD-10-CM

## 2013-11-08 NOTE — Progress Notes (Signed)
   Subjective:    Patient ID: Stephanie Martinez, female    DOB: 11-Nov-1921, 80 y.o.   MRN: [id number]  HPI PT STATED LT FOOT 5TH TOE HAVE CORN AND ITS BEEN HURTING FOR 2 YEARS. THE TOE IS BETTER TODAY BUT NOT WORSE. THE TO GET AGGRAVATED WITH CERTAIN SHOES AND PRESSURE. TRIED SOAK WITH WARM WATER, CORN PAD, AND WEARING OPEN SHOES HELPS SOME.  ALSO, LT FOOT IS SWOLLEN AND SORE FOR 1 WEEK. THE FOOT IS BEEN THE SAME NOT WORSE. THE FOOT GET AGGRAVATED BY WALKING AND PRESSURE AND TRIED NO TREATMENT.   Review of Systems  HENT: Positive for hearing loss.   Respiratory: Positive for cough.   Cardiovascular: Positive for leg swelling.  Hematological: Bruises/bleeds easily.  All other systems reviewed and are negative.      Objective:   Physical Exam Patient is awake, alert, and oriented x 3.  In no acute distress.  Vascular status is intact with palpable pedal pulses at 1/4 DP and PT bilateral and capillary refill time within normal limits. Neurological sensation is also intact bilaterally via Semmes Weinstein monofilament at 5/5 sites. Light touch, vibratory sensation, Achilles tendon reflex is intact. Dermatological exam reveals skin color, turger and texture as normal. No open lesions present.  Musculature intact with dorsiflexion, plantarflexion, inversion, eversion.  Corn present on the 5th toe of the left foot.  No redness or signs of infection present.  Mild pain on the left foot in general with minimal swelling noted.     Assessment & Plan:  Corn 5th toe left, mild swelling  Debridement of the lesion carried out. Recommended padding and shoe changes.  Her daughter states she will cut a hole in her shoe.  i discussed surgery but notified her this is more than just a skin issue, it is the bone underneath causing the problem.

## 2013-11-08 NOTE — Patient Instructions (Signed)

## 2013-11-21 ENCOUNTER — Encounter: Payer: Self-pay | Admitting: Internal Medicine

## 2013-11-21 ENCOUNTER — Ambulatory Visit (INDEPENDENT_AMBULATORY_CARE_PROVIDER_SITE_OTHER): Payer: Medicare Other | Admitting: Internal Medicine

## 2013-11-21 ENCOUNTER — Other Ambulatory Visit (INDEPENDENT_AMBULATORY_CARE_PROVIDER_SITE_OTHER): Payer: Medicare Other

## 2013-11-21 VITALS — BP 110/68 | HR 66 | Temp 97.7°F | Resp 16 | Ht 62.0 in | Wt 129.0 lb

## 2013-11-21 DIAGNOSIS — R609 Edema, unspecified: Secondary | ICD-10-CM

## 2013-11-21 DIAGNOSIS — I10 Essential (primary) hypertension: Secondary | ICD-10-CM

## 2013-11-21 DIAGNOSIS — M1610 Unilateral primary osteoarthritis, unspecified hip: Secondary | ICD-10-CM

## 2013-11-21 DIAGNOSIS — E7800 Pure hypercholesterolemia, unspecified: Secondary | ICD-10-CM

## 2013-11-21 DIAGNOSIS — M169 Osteoarthritis of hip, unspecified: Secondary | ICD-10-CM

## 2013-11-21 DIAGNOSIS — M47816 Spondylosis without myelopathy or radiculopathy, lumbar region: Secondary | ICD-10-CM

## 2013-11-21 DIAGNOSIS — M47817 Spondylosis without myelopathy or radiculopathy, lumbosacral region: Secondary | ICD-10-CM

## 2013-11-21 LAB — CBC WITH DIFFERENTIAL/PLATELET
BASOS ABS: 0 10*3/uL (ref 0.0–0.1)
Basophils Relative: 0.5 % (ref 0.0–3.0)
EOS ABS: 0.5 10*3/uL (ref 0.0–0.7)
Eosinophils Relative: 6.3 % — ABNORMAL HIGH (ref 0.0–5.0)
HCT: 38 % (ref 36.0–46.0)
HEMOGLOBIN: 12.9 g/dL (ref 12.0–15.0)
Lymphocytes Relative: 18.9 % (ref 12.0–46.0)
Lymphs Abs: 1.6 10*3/uL (ref 0.7–4.0)
MCHC: 34 g/dL (ref 30.0–36.0)
MCV: 92.5 fl (ref 78.0–100.0)
MONOS PCT: 7.9 % (ref 3.0–12.0)
Monocytes Absolute: 0.7 10*3/uL (ref 0.1–1.0)
NEUTROS PCT: 66.4 % (ref 43.0–77.0)
Neutro Abs: 5.5 10*3/uL (ref 1.4–7.7)
Platelets: 276 10*3/uL (ref 150.0–400.0)
RBC: 4.11 Mil/uL (ref 3.87–5.11)
RDW: 13.8 % (ref 11.5–15.5)
WBC: 8.3 10*3/uL (ref 4.0–10.5)

## 2013-11-21 LAB — URINALYSIS, ROUTINE W REFLEX MICROSCOPIC
BILIRUBIN URINE: NEGATIVE
Hgb urine dipstick: NEGATIVE
KETONES UR: NEGATIVE
Leukocytes, UA: NEGATIVE
Nitrite: NEGATIVE
SPECIFIC GRAVITY, URINE: 1.01 (ref 1.000–1.030)
TOTAL PROTEIN, URINE-UPE24: NEGATIVE
URINE GLUCOSE: NEGATIVE
Urobilinogen, UA: 0.2 (ref 0.0–1.0)
pH: 6.5 (ref 5.0–8.0)

## 2013-11-21 MED ORDER — TRAMADOL HCL 50 MG PO TABS: 50.0000 mg | ORAL_TABLET | Freq: Three times a day (TID) | ORAL | Status: DC | PRN

## 2013-11-21 NOTE — Progress Notes (Signed)
Pre visit review using our clinic review tool, if applicable. No additional management support is needed unless otherwise documented below in the visit note. 

## 2013-11-21 NOTE — Assessment & Plan Note (Signed)
Will stop nsaids and try tramadol for the pain

## 2013-11-21 NOTE — Assessment & Plan Note (Signed)
FLP today 

## 2013-11-21 NOTE — Assessment & Plan Note (Signed)
Her BP is well controlled I will check her lytes and renal function today 

## 2013-11-21 NOTE — Assessment & Plan Note (Signed)
EKG is normal, Will stop all nsaids Will check a BNP to see if there is concern for CHF

## 2013-11-21 NOTE — Patient Instructions (Signed)

## 2013-11-21 NOTE — Progress Notes (Signed)
   Subjective:    Patient ID: Stephanie Martinez, female    DOB: [birth year], 80 y.o.   MRN: [id number]  Hypertension This is a chronic problem. The current episode started more than 1 year ago. The problem has been gradually improving since onset. The problem is controlled. Associated symptoms include malaise/fatigue and peripheral edema. Pertinent negatives include no anxiety, blurred vision, chest pain, headaches, neck pain, orthopnea, palpitations, PND, shortness of breath or sweats. Agents associated with hypertension include NSAIDs. Past treatments include diuretics, beta blockers and angiotensin blockers. The current treatment provides significant improvement. There are no compliance problems.       Review of Systems  Constitutional: Positive for malaise/fatigue. Negative for fever, chills, diaphoresis, appetite change and fatigue.  HENT: Negative.   Eyes: Negative.  Negative for blurred vision.  Respiratory: Negative.  Negative for cough, choking, chest tightness, shortness of breath, wheezing and stridor.   Cardiovascular: Negative.  Negative for chest pain, palpitations, orthopnea, leg swelling and PND.  Gastrointestinal: Negative.  Negative for nausea, vomiting, abdominal pain, diarrhea, constipation and blood in stool.  Endocrine: Negative.   Genitourinary: Negative.   Musculoskeletal: Positive for arthralgias and back pain. Negative for myalgias, neck pain and neck stiffness.  Skin: Negative.  Negative for rash.  Allergic/Immunologic: Negative.   Neurological: Negative.  Negative for headaches.  Hematological: Negative.  Negative for adenopathy.  Psychiatric/Behavioral: Negative.        Objective:   Physical Exam  Vitals reviewed. Constitutional: She is oriented to person, place, and time. She appears well-developed and well-nourished. No distress.  HENT:  Head: Normocephalic and atraumatic.  Mouth/Throat: Oropharynx is clear and moist. No oropharyngeal exudate.  Eyes:  Conjunctivae are normal. Right eye exhibits no discharge. Left eye exhibits no discharge. No scleral icterus.  Neck: Normal range of motion. Neck supple. No JVD present. No tracheal deviation present. No thyromegaly present.  Cardiovascular: Normal rate, regular rhythm, normal heart sounds and intact distal pulses.  Exam reveals no gallop and no friction rub.   No murmur heard. Pulmonary/Chest: Effort normal and breath sounds normal. No stridor. No respiratory distress. She has no wheezes. She has no rales. She exhibits no tenderness.  Abdominal: Soft. Bowel sounds are normal. She exhibits no distension and no mass. There is no tenderness. There is no rebound and no guarding.  Musculoskeletal: Normal range of motion. She exhibits edema (trace edema both ankles). She exhibits no tenderness.  Lymphadenopathy:    She has no cervical adenopathy.  Neurological: She is oriented to person, place, and time.  Skin: Skin is warm and dry. No rash noted. She is not diaphoretic. No erythema. No pallor.  Psychiatric: She has a normal mood and affect. Her behavior is normal. Judgment and thought content normal.     Lab Results  Component Value Date   WBC 14.9* 09/10/2013   HGB 12.1 09/10/2013   HCT 35.9* 09/10/2013   PLT 356.0 09/10/2013   GLUCOSE 100* 09/10/2013   CHOL 226* 02/16/2012   TRIG 228* 02/16/2012   HDL 45 02/16/2012   LDLDIRECT 136.1 08/27/2011   LDLCALC 135* 02/16/2012   ALT 12 09/18/2012   AST 22 09/18/2012   NA 136 09/10/2013   K 3.3* 09/10/2013   CL 101 09/10/2013   CREATININE 1.6* 09/10/2013   BUN 31* 09/10/2013   CO2 25 09/10/2013   TSH 2.89 09/10/2013   INR 0.96 02/15/2012   HGBA1C 5.7 09/18/2012       Assessment & Plan:

## 2013-11-22 ENCOUNTER — Encounter: Payer: Self-pay | Admitting: Internal Medicine

## 2013-11-22 LAB — COMPREHENSIVE METABOLIC PANEL
ALBUMIN: 4 g/dL (ref 3.5–5.2)
ALT: 15 U/L (ref 0–35)
AST: 20 U/L (ref 0–37)
Alkaline Phosphatase: 121 U/L — ABNORMAL HIGH (ref 39–117)
BUN: 26 mg/dL — AB (ref 6–23)
CALCIUM: 9.8 mg/dL (ref 8.4–10.5)
CO2: 30 meq/L (ref 19–32)
CREATININE: 1.2 mg/dL (ref 0.4–1.2)
Chloride: 96 mEq/L (ref 96–112)
GFR: 43.19 mL/min — ABNORMAL LOW (ref >60.00)
Glucose, Bld: 96 mg/dL (ref 70–99)
POTASSIUM: 3.9 meq/L (ref 3.5–5.1)
Sodium: 135 mEq/L (ref 135–145)
Total Bilirubin: 0.7 mg/dL (ref 0.2–1.2)
Total Protein: 6.5 g/dL (ref 6.0–8.3)

## 2013-11-22 LAB — LIPID PANEL
Cholesterol: 236 mg/dL — ABNORMAL HIGH (ref 0–200)
HDL: 42.5 mg/dL (ref >39.00)
LDL Cholesterol: 149 mg/dL — ABNORMAL HIGH (ref 0–99)
NonHDL: 193.5
TRIGLYCERIDES: 225 mg/dL — AB (ref 0.0–149.0)
Total CHOL/HDL Ratio: 6
VLDL: 45 mg/dL — AB (ref 0.0–40.0)

## 2013-11-22 LAB — BRAIN NATRIURETIC PEPTIDE: Pro B Natriuretic peptide (BNP): 101 pg/mL — ABNORMAL HIGH (ref 0.0–100.0)

## 2013-11-22 LAB — TSH: TSH: 2.39 u[IU]/mL (ref 0.35–4.50)

## 2013-12-03 ENCOUNTER — Other Ambulatory Visit: Payer: Self-pay | Admitting: Internal Medicine

## 2013-12-21 ENCOUNTER — Other Ambulatory Visit: Payer: Self-pay | Admitting: Internal Medicine

## 2013-12-31 ENCOUNTER — Other Ambulatory Visit: Payer: Self-pay | Admitting: Internal Medicine

## 2014-01-08 ENCOUNTER — Telehealth: Payer: Self-pay | Admitting: Obstetrics & Gynecology

## 2014-01-08 NOTE — Telephone Encounter (Signed)
Patient's daughter "Nancy"calling. Patient found a breast lump, left side. Izora Gala says her mom has history of breast cancer in her left breast.

## 2014-01-08 NOTE — Telephone Encounter (Signed)
Spoke with patient's daughter Stephanie Martinez. Okay per ROI. States that patient found 1-2 lumps in left breast between her armpit.She states that patient has a history of breast cancer in her left breast with a lumpectomy and radiation. Patient has had recent mammogram which was normal but is concerned because last time she had breast cancer she had recently had a mammogram as well. Would like to schedule an appointment. Advised Dr.Miller is out of the office this week but patient could be seen with another provider for evaluation. [name] is agreeable. Appointment scheduled for tomorrow at 3pm with Dr.Lathrop. Agreeable to date and time.  Routing to Dr.Lathrop Cc: Dr.Miller  Routing to provider for final review. Patient agreeable to disposition. Will close encounter

## 2014-01-09 ENCOUNTER — Ambulatory Visit (INDEPENDENT_AMBULATORY_CARE_PROVIDER_SITE_OTHER): Payer: Medicare Other | Admitting: Gynecology

## 2014-01-09 ENCOUNTER — Other Ambulatory Visit: Payer: Self-pay

## 2014-01-09 ENCOUNTER — Encounter: Payer: Self-pay | Admitting: Gynecology

## 2014-01-09 VITALS — BP 122/80 | Resp 16 | Ht 62.0 in | Wt 128.0 lb

## 2014-01-09 DIAGNOSIS — N63 Unspecified lump in unspecified breast: Secondary | ICD-10-CM

## 2014-01-09 DIAGNOSIS — N6320 Unspecified lump in the left breast, unspecified quadrant: Secondary | ICD-10-CM

## 2014-01-09 NOTE — Progress Notes (Signed)
Appointment made for L Breast DX Mammogram and L Breast Ultrasound. Appointment made with patient in office for 01/10/14 at [facility] imaging patient agreeable to time/date.

## 2014-01-09 NOTE — Progress Notes (Signed)
Pt with strong family history and personal history of breast cancer is brought in by her daughter for  Palpable mass in left breast in region of her prior lumpectomy and RT.  Pt is over 5y post treatment.  No post treatment hormones.  Pt has been getting annual diagnostic mammograms since moving to [place] from [facility] where she was treated.  Last Dx mammo in 10/2013. Pt reports breasts are dense and that she gets "lumps" that come and go all of the time but this one has been present for over 1w which is unusual.  ROS: per HPI BP 122/80  Resp 16  Ht 5\' 2"  (1.575 m)  Wt 128 lb (58.06 kg)  BMI 23.41 kg/m2 General appearance: alert, cooperative and appears stated age Breasts: Inspection negative, No nipple retraction or dimpling, No nipple discharge or bleeding, No axillary or supraclavicular adenopathy, positive findings: fibrocystic changes and very dense, glandualar, thick scarring from lumpectomy and RT. Pt examined supine and upright.  Assessment: Self reported breast mass, not appreciated today  Plan: Pt and daughter unable to find area of concern in office today Will repeat dx mammo on left with u/s Agree to plan Daughter will look for records of breast pathology at home

## 2014-01-10 ENCOUNTER — Ambulatory Visit
Admission: RE | Admit: 2014-01-10 | Discharge: 2014-01-10 | Disposition: A | Discharge status: Home / Self Care | Payer: Medicare Other | Source: Ambulatory Visit | Attending: Gynecology | Admitting: Gynecology

## 2014-01-10 DIAGNOSIS — N6320 Unspecified lump in the left breast, unspecified quadrant: Secondary | ICD-10-CM

## 2014-04-22 ENCOUNTER — Encounter: Payer: Self-pay | Admitting: Gynecology

## 2014-04-25 ENCOUNTER — Ambulatory Visit (INDEPENDENT_AMBULATORY_CARE_PROVIDER_SITE_OTHER): Payer: Medicare Other

## 2014-04-25 DIAGNOSIS — Z23 Encounter for immunization: Secondary | ICD-10-CM

## 2014-05-29 ENCOUNTER — Telehealth: Payer: Self-pay | Admitting: Emergency Medicine

## 2014-05-29 NOTE — Telephone Encounter (Signed)
-----   Message from Elveria Rising, MD sent at 04/01/2014  8:13 AM EDT ----- Regarding: RE: Mammogram hold yes ----- Message -----    From: Karen Chafe, RN    Sent: 01/31/2014   8:47 AM      To: Azalia Bilis, MD Subject: Mammogram hold                                 Okay to remove from mammogram hold?

## 2014-08-13 ENCOUNTER — Telehealth: Payer: Self-pay | Admitting: *Deleted

## 2014-08-13 DIAGNOSIS — I10 Essential (primary) hypertension: Secondary | ICD-10-CM

## 2014-08-13 MED ORDER — LOSARTAN POTASSIUM-HCTZ 100-12.5 MG PO TABS: 1.0000 | ORAL_TABLET | Freq: Every day | ORAL | Status: DC

## 2014-08-13 NOTE — Telephone Encounter (Signed)
Spoke with pts daughter, advised of medication change

## 2014-08-13 NOTE — Telephone Encounter (Signed)
changed

## 2014-08-13 NOTE — Telephone Encounter (Signed)
Fax from [employer] no longer covers Valsartan/HCTZ.  Please advise medication change.  Last OV 6.3.15.

## 2014-10-10 ENCOUNTER — Other Ambulatory Visit: Payer: Self-pay | Admitting: Internal Medicine

## 2014-10-10 ENCOUNTER — Telehealth: Payer: Self-pay

## 2014-10-10 DIAGNOSIS — I10 Essential (primary) hypertension: Secondary | ICD-10-CM

## 2014-10-10 MED ORDER — CARVEDILOL 3.125 MG PO TABS: 3.1250 mg | ORAL_TABLET | Freq: Two times a day (BID) | ORAL | Status: DC

## 2014-10-10 NOTE — Telephone Encounter (Signed)
meds changed She is overdue for f/up with me

## 2014-10-10 NOTE — Telephone Encounter (Signed)
PA denied stating that pt must try and failed at least two alternatives (atenolol, carvedilol, metoprolol). Thanks

## 2014-10-10 NOTE — Telephone Encounter (Signed)
Received pharmacy rejection stating that insurance will not cover bystolic without a prior authorization. PA submitted via covermymeds, approval now pending their decision.

## 2014-10-11 NOTE — Telephone Encounter (Signed)
Called daughter, left detailed voice mail advising of insurance denial for bystolic. I also notified that a new Rx has been sent to pharmacy and per MD, pt is overdue for an office visit.

## 2014-11-14 ENCOUNTER — Other Ambulatory Visit (INDEPENDENT_AMBULATORY_CARE_PROVIDER_SITE_OTHER): Payer: Medicare Other

## 2014-11-14 ENCOUNTER — Encounter: Payer: Self-pay | Admitting: Internal Medicine

## 2014-11-14 ENCOUNTER — Ambulatory Visit (INDEPENDENT_AMBULATORY_CARE_PROVIDER_SITE_OTHER): Payer: Medicare Other | Admitting: Internal Medicine

## 2014-11-14 ENCOUNTER — Ambulatory Visit (INDEPENDENT_AMBULATORY_CARE_PROVIDER_SITE_OTHER)
Admission: RE | Admit: 2014-11-14 | Discharge: 2014-11-14 | Disposition: A | Discharge status: Home / Self Care | Payer: Medicare Other | Source: Ambulatory Visit | Attending: Internal Medicine | Admitting: Internal Medicine

## 2014-11-14 VITALS — BP 160/102 | HR 87 | Temp 98.3°F | Resp 16 | Ht 62.0 in | Wt 133.0 lb

## 2014-11-14 DIAGNOSIS — I10 Essential (primary) hypertension: Secondary | ICD-10-CM

## 2014-11-14 DIAGNOSIS — E7800 Pure hypercholesterolemia, unspecified: Secondary | ICD-10-CM

## 2014-11-14 DIAGNOSIS — K219 Gastro-esophageal reflux disease without esophagitis: Secondary | ICD-10-CM

## 2014-11-14 DIAGNOSIS — R059 Cough, unspecified: Secondary | ICD-10-CM | POA: Insufficient documentation

## 2014-11-14 DIAGNOSIS — J441 Chronic obstructive pulmonary disease with (acute) exacerbation: Secondary | ICD-10-CM | POA: Diagnosis not present

## 2014-11-14 DIAGNOSIS — R05 Cough: Secondary | ICD-10-CM

## 2014-11-14 LAB — CBC WITH DIFFERENTIAL/PLATELET
Basophils Absolute: 0 10*3/uL (ref 0.0–0.1)
Basophils Relative: 0.5 % (ref 0.0–3.0)
Eosinophils Absolute: 0.6 10*3/uL (ref 0.0–0.7)
Eosinophils Relative: 6.5 % — ABNORMAL HIGH (ref 0.0–5.0)
HEMATOCRIT: 37.7 % (ref 36.0–46.0)
Hemoglobin: 13 g/dL (ref 12.0–15.0)
Lymphocytes Relative: 16.6 % (ref 12.0–46.0)
Lymphs Abs: 1.4 10*3/uL (ref 0.7–4.0)
MCHC: 34.6 g/dL (ref 30.0–36.0)
MCV: 91.8 fl (ref 78.0–100.0)
MONO ABS: 0.8 10*3/uL (ref 0.1–1.0)
Monocytes Relative: 9.3 % (ref 3.0–12.0)
NEUTROS PCT: 67.1 % (ref 43.0–77.0)
Neutro Abs: 5.8 10*3/uL (ref 1.4–7.7)
Platelets: 317 10*3/uL (ref 150.0–400.0)
RBC: 4.11 Mil/uL (ref 3.87–5.11)
RDW: 12.4 % (ref 11.5–15.5)
WBC: 8.6 10*3/uL (ref 4.0–10.5)

## 2014-11-14 LAB — COMPREHENSIVE METABOLIC PANEL
ALT: 15 U/L (ref 0–35)
AST: 22 U/L (ref 0–37)
Albumin: 4.2 g/dL (ref 3.5–5.2)
Alkaline Phosphatase: 108 U/L (ref 39–117)
BILIRUBIN TOTAL: 0.6 mg/dL (ref 0.2–1.2)
BUN: 27 mg/dL — ABNORMAL HIGH (ref 6–23)
CALCIUM: 9.6 mg/dL (ref 8.4–10.5)
CO2: 30 mEq/L (ref 19–32)
CREATININE: 1.48 mg/dL — AB (ref 0.40–1.20)
Chloride: 94 mEq/L — ABNORMAL LOW (ref 96–112)
GFR: 35.14 mL/min — ABNORMAL LOW (ref >60.00)
Glucose, Bld: 102 mg/dL — ABNORMAL HIGH (ref 70–99)
POTASSIUM: 4.2 meq/L (ref 3.5–5.1)
Sodium: 130 mEq/L — ABNORMAL LOW (ref 135–145)
Total Protein: 6.9 g/dL (ref 6.0–8.3)

## 2014-11-14 LAB — LIPID PANEL
Cholesterol: 206 mg/dL — ABNORMAL HIGH (ref 0–200)
HDL: 45.9 mg/dL (ref >39.00)
NonHDL: 160.1
Total CHOL/HDL Ratio: 4
Triglycerides: 218 mg/dL — ABNORMAL HIGH (ref 0.0–149.0)
VLDL: 43.6 mg/dL — AB (ref 0.0–40.0)

## 2014-11-14 LAB — TSH: TSH: 2.05 u[IU]/mL (ref 0.35–4.50)

## 2014-11-14 LAB — LDL CHOLESTEROL, DIRECT: Direct LDL: 124 mg/dL

## 2014-11-14 MED ORDER — UMECLIDINIUM BROMIDE 62.5 MCG/INH IN AEPB: 1.0000 | INHALATION_SPRAY | Freq: Every day | RESPIRATORY_TRACT | Status: DC

## 2014-11-14 MED ORDER — CARVEDILOL 6.25 MG PO TABS: 6.2500 mg | ORAL_TABLET | Freq: Two times a day (BID) | ORAL | Status: DC

## 2014-11-14 NOTE — Patient Instructions (Signed)

## 2014-11-14 NOTE — Progress Notes (Signed)
Subjective:  Patient ID: Stephanie Martinez, female    DOB: 03-23-1922  Age: 81 y.o. MRN: [id number]  CC: COPD; Cough; Hyperlipidemia; and Hypertension   HPI Stephanie Martinez presents for a 2-3 month history of NP cough and HTN evaluation.  Outpatient Prescriptions Prior to Visit  Medication Sig Dispense Refill  . aspirin 81 MG tablet Take 81 mg by mouth daily.    . calcium citrate-vitamin D (CITRACAL+D) 315-200 MG-UNIT per tablet Take 1 tablet by mouth daily.    Marland Kitchen GLUCOSAMINE HCL PO Take 1,000 mg by mouth daily.    Marland Kitchen latanoprost (XALATAN) 0.005 % ophthalmic solution Place 1 drop into both eyes at bedtime.     Marland Kitchen losartan-hydrochlorothiazide (HYZAAR) 100-12.5 MG per tablet Take 1 tablet by mouth daily. 90 tablet 3  . LUTEIN PO Take 25 mg by mouth daily.    Marland Kitchen omeprazole (PRILOSEC) 20 MG capsule TAKE 1 CAPSULE BY MOUTH DAILY 90 capsule 3  . traMADol (ULTRAM) 50 MG tablet Take 1 tablet (50 mg total) by mouth every 8 (eight) hours as needed. 60 tablet 5  . vitamin C (ASCORBIC ACID) 500 MG tablet Take 500 mg by mouth daily.    . carvedilol (COREG) 3.125 MG tablet Take 1 tablet (3.125 mg total) by mouth 2 (two) times daily with a meal. 180 tablet 0   No facility-administered medications prior to visit.    ROS Review of Systems  Constitutional: Negative.  Negative for fever, chills, diaphoresis, activity change, appetite change, fatigue and unexpected weight change.  HENT: Negative.  Negative for sinus pressure, trouble swallowing and voice change.   Eyes: Negative.   Respiratory: Positive for cough. Negative for apnea, choking, chest tightness, shortness of breath, wheezing and stridor.   Cardiovascular: Negative.  Negative for chest pain, palpitations and leg swelling.  Gastrointestinal: Negative.  Negative for nausea, vomiting, abdominal pain, diarrhea, constipation and blood in stool.  Endocrine: Negative.   Genitourinary: Negative.   Musculoskeletal: Negative.  Negative for  myalgias, back pain, joint swelling and arthralgias.  Skin: Negative.  Negative for rash.  Allergic/Immunologic: Negative.   Neurological: Negative.  Negative for dizziness, seizures, syncope, speech difficulty, weakness, light-headedness, numbness and headaches.  Hematological: Negative.  Negative for adenopathy. Does not bruise/bleed easily.  Psychiatric/Behavioral: Negative.     Objective:  BP 148/102 mmHg  Pulse 87  Temp(Src) 98.3 F (36.8 C) (Oral)  Resp 16  Ht 5\' 2"  (1.575 m)  Wt 133 lb (60.328 kg)  BMI 24.32 kg/m2  SpO2 96%  BP Readings from Last 3 Encounters:  11/14/14 148/102  01/09/14 122/80  11/21/13 110/68    Wt Readings from Last 3 Encounters:  11/14/14 133 lb (60.328 kg)  01/09/14 128 lb (58.06 kg)  11/21/13 129 lb (58.514 kg)    Physical Exam  Constitutional: She is oriented to person, place, and time. She appears well-developed and well-nourished.  Non-toxic appearance. She does not have a sickly appearance. She does not appear ill. No distress.  HENT:  Head: Normocephalic and atraumatic.  Mouth/Throat: Oropharynx is clear and moist. No oropharyngeal exudate.  Eyes: Conjunctivae are normal. Right eye exhibits no discharge. Left eye exhibits no discharge. No scleral icterus.  Neck: Normal range of motion. Neck supple. No JVD present. No tracheal deviation present. No thyromegaly present.  Cardiovascular: Normal rate, regular rhythm, normal heart sounds and intact distal pulses.  Exam reveals no gallop and no friction rub.   No murmur heard. Pulmonary/Chest: Effort normal. No accessory muscle usage or  stridor. No tachypnea. No respiratory distress. She has no decreased breath sounds. She has no wheezes. She has no rhonchi. She has rales in the right lower field and the left lower field. She exhibits no tenderness.  Abdominal: Soft. Bowel sounds are normal. She exhibits no distension and no mass. There is no tenderness. There is no rebound and no guarding.    Musculoskeletal: Normal range of motion. She exhibits no edema or tenderness.  Lymphadenopathy:    She has no cervical adenopathy.  Neurological: She is oriented to person, place, and time.  Skin: Skin is warm and dry. No rash noted. She is not diaphoretic. No erythema. No pallor.  Psychiatric: She has a normal mood and affect. Her behavior is normal. Judgment and thought content normal.  Vitals reviewed.   Lab Results  Component Value Date   WBC 8.6 11/14/2014   HGB 13.0 11/14/2014   HCT 37.7 11/14/2014   PLT 317.0 11/14/2014   GLUCOSE 102* 11/14/2014   CHOL 206* 11/14/2014   TRIG 218.0* 11/14/2014   HDL 45.90 11/14/2014   LDLDIRECT 124.0 11/14/2014   LDLCALC 149* 11/21/2013   ALT 15 11/14/2014   AST 22 11/14/2014   NA 130* 11/14/2014   K 4.2 11/14/2014   CL 94* 11/14/2014   CREATININE 1.48* 11/14/2014   BUN 27* 11/14/2014   CO2 30 11/14/2014   TSH 2.05 11/14/2014   INR 0.96 02/15/2012   HGBA1C 5.7 09/18/2012    No results found.  Assessment & Plan:   Stephanie Martinez was seen today for copd, cough, hyperlipidemia and hypertension.  Diagnoses and all orders for this visit:  Essential hypertension, benign - her BP is not well controlled, will increase the dose of carvedilol, will check her labs today to screen for secondary causes of HTN and for end organ damage Orders: -     carvedilol (COREG) 6.25 MG tablet; Take 1 tablet (6.25 mg total) by mouth 2 (two) times daily with a meal. -     Comprehensive metabolic panel; Future -     CBC with Differential/Platelet; Future -     TSH; Future  Obstructive chronic bronchitis with exacerbation - start incruse Orders: -     Umeclidinium Bromide (INCRUSE ELLIPTA) 62.5 MCG/INH AEPB; Inhale 1 puff into the lungs daily.  Pure hypercholesterolemia Orders: -     Comprehensive metabolic panel; Future -     TSH; Future -     Lipid panel; Future  Gastroesophageal reflux disease without esophagitis Orders: -     CBC with  Differential/Platelet; Future  Cough - will check a CXR to screen for mass, edema, PNA Orders: -     DG Chest 2 View; Future   I have discontinued [name]'s carvedilol. I am also having her start on Umeclidinium Bromide and carvedilol. Additionally, I am having her maintain her calcium citrate-vitamin D, latanoprost, LUTEIN PO, aspirin, GLUCOSAMINE HCL PO, vitamin C, traMADol, omeprazole, and losartan-hydrochlorothiazide.  Meds ordered this encounter  Medications  . Umeclidinium Bromide (INCRUSE ELLIPTA) 62.5 MCG/INH AEPB    Sig: Inhale 1 puff into the lungs daily.    Dispense:  30 each    Refill:  11  . carvedilol (COREG) 6.25 MG tablet    Sig: Take 1 tablet (6.25 mg total) by mouth 2 (two) times daily with a meal.    Dispense:  60 tablet    Refill:  5     Follow-up: Return in about 6 weeks (around 12/26/2014).  Scarlette Calico, MD

## 2014-11-25 ENCOUNTER — Encounter (HOSPITAL_COMMUNITY): Payer: Self-pay

## 2014-11-25 ENCOUNTER — Emergency Department (HOSPITAL_COMMUNITY): Payer: Medicare Other

## 2014-11-25 ENCOUNTER — Inpatient Hospital Stay (HOSPITAL_COMMUNITY): Payer: Medicare Other

## 2014-11-25 ENCOUNTER — Inpatient Hospital Stay (HOSPITAL_COMMUNITY)
Admission: EM | Admit: 2014-11-25 | Discharge: 2014-11-29 | DRG: 493 | Disposition: A | Discharge status: Home Health | Payer: Medicare Other | Attending: Internal Medicine | Admitting: Internal Medicine

## 2014-11-25 DIAGNOSIS — K5900 Constipation, unspecified: Secondary | ICD-10-CM | POA: Diagnosis not present

## 2014-11-25 DIAGNOSIS — E7800 Pure hypercholesterolemia, unspecified: Secondary | ICD-10-CM | POA: Diagnosis present

## 2014-11-25 DIAGNOSIS — Z800 Family history of malignant neoplasm of digestive organs: Secondary | ICD-10-CM | POA: Diagnosis not present

## 2014-11-25 DIAGNOSIS — E871 Hypo-osmolality and hyponatremia: Secondary | ICD-10-CM | POA: Diagnosis present

## 2014-11-25 DIAGNOSIS — M810 Age-related osteoporosis without current pathological fracture: Secondary | ICD-10-CM | POA: Diagnosis present

## 2014-11-25 DIAGNOSIS — M545 Low back pain, unspecified: Secondary | ICD-10-CM | POA: Diagnosis present

## 2014-11-25 DIAGNOSIS — Z66 Do not resuscitate: Secondary | ICD-10-CM | POA: Diagnosis present

## 2014-11-25 DIAGNOSIS — W010XXA Fall on same level from slipping, tripping and stumbling without subsequent striking against object, initial encounter: Secondary | ICD-10-CM | POA: Diagnosis present

## 2014-11-25 DIAGNOSIS — E860 Dehydration: Secondary | ICD-10-CM | POA: Diagnosis present

## 2014-11-25 DIAGNOSIS — Z419 Encounter for procedure for purposes other than remedying health state, unspecified: Secondary | ICD-10-CM

## 2014-11-25 DIAGNOSIS — K219 Gastro-esophageal reflux disease without esophagitis: Secondary | ICD-10-CM | POA: Diagnosis present

## 2014-11-25 DIAGNOSIS — Z96643 Presence of artificial hip joint, bilateral: Secondary | ICD-10-CM | POA: Diagnosis present

## 2014-11-25 DIAGNOSIS — R110 Nausea: Secondary | ICD-10-CM | POA: Diagnosis not present

## 2014-11-25 DIAGNOSIS — W19XXXA Unspecified fall, initial encounter: Secondary | ICD-10-CM

## 2014-11-25 DIAGNOSIS — S42309A Unspecified fracture of shaft of humerus, unspecified arm, initial encounter for closed fracture: Secondary | ICD-10-CM

## 2014-11-25 DIAGNOSIS — Z823 Family history of stroke: Secondary | ICD-10-CM | POA: Diagnosis not present

## 2014-11-25 DIAGNOSIS — I951 Orthostatic hypotension: Secondary | ICD-10-CM | POA: Diagnosis not present

## 2014-11-25 DIAGNOSIS — Z79899 Other long term (current) drug therapy: Secondary | ICD-10-CM | POA: Diagnosis not present

## 2014-11-25 DIAGNOSIS — Z853 Personal history of malignant neoplasm of breast: Secondary | ICD-10-CM | POA: Diagnosis not present

## 2014-11-25 DIAGNOSIS — M79601 Pain in right arm: Secondary | ICD-10-CM | POA: Diagnosis present

## 2014-11-25 DIAGNOSIS — S42409S Unspecified fracture of lower end of unspecified humerus, sequela: Secondary | ICD-10-CM | POA: Diagnosis not present

## 2014-11-25 DIAGNOSIS — H409 Unspecified glaucoma: Secondary | ICD-10-CM | POA: Diagnosis present

## 2014-11-25 DIAGNOSIS — Z85038 Personal history of other malignant neoplasm of large intestine: Secondary | ICD-10-CM

## 2014-11-25 DIAGNOSIS — I10 Essential (primary) hypertension: Secondary | ICD-10-CM | POA: Diagnosis not present

## 2014-11-25 DIAGNOSIS — Z87891 Personal history of nicotine dependence: Secondary | ICD-10-CM

## 2014-11-25 DIAGNOSIS — I129 Hypertensive chronic kidney disease with stage 1 through stage 4 chronic kidney disease, or unspecified chronic kidney disease: Secondary | ICD-10-CM | POA: Diagnosis present

## 2014-11-25 DIAGNOSIS — S42401A Unspecified fracture of lower end of right humerus, initial encounter for closed fracture: Secondary | ICD-10-CM

## 2014-11-25 DIAGNOSIS — Z803 Family history of malignant neoplasm of breast: Secondary | ICD-10-CM

## 2014-11-25 DIAGNOSIS — D62 Acute posthemorrhagic anemia: Secondary | ICD-10-CM | POA: Diagnosis not present

## 2014-11-25 DIAGNOSIS — K210 Gastro-esophageal reflux disease with esophagitis: Secondary | ICD-10-CM

## 2014-11-25 DIAGNOSIS — Y92018 Other place in single-family (private) house as the place of occurrence of the external cause: Secondary | ICD-10-CM | POA: Diagnosis not present

## 2014-11-25 DIAGNOSIS — Z7982 Long term (current) use of aspirin: Secondary | ICD-10-CM

## 2014-11-25 DIAGNOSIS — N183 Chronic kidney disease, stage 3 unspecified: Secondary | ICD-10-CM | POA: Diagnosis present

## 2014-11-25 DIAGNOSIS — Z806 Family history of leukemia: Secondary | ICD-10-CM

## 2014-11-25 DIAGNOSIS — E876 Hypokalemia: Secondary | ICD-10-CM | POA: Diagnosis present

## 2014-11-25 DIAGNOSIS — S42301B Unspecified fracture of shaft of humerus, right arm, initial encounter for open fracture: Secondary | ICD-10-CM

## 2014-11-25 DIAGNOSIS — Z8249 Family history of ischemic heart disease and other diseases of the circulatory system: Secondary | ICD-10-CM

## 2014-11-25 DIAGNOSIS — S42351A Displaced comminuted fracture of shaft of humerus, right arm, initial encounter for closed fracture: Principal | ICD-10-CM | POA: Diagnosis present

## 2014-11-25 DIAGNOSIS — Z923 Personal history of irradiation: Secondary | ICD-10-CM

## 2014-11-25 DIAGNOSIS — S42409A Unspecified fracture of lower end of unspecified humerus, initial encounter for closed fracture: Secondary | ICD-10-CM | POA: Diagnosis present

## 2014-11-25 HISTORY — DX: Chronic kidney disease, stage 3 unspecified: N18.30

## 2014-11-25 HISTORY — DX: Chronic kidney disease, stage 3 (moderate): N18.3

## 2014-11-25 LAB — URINALYSIS, ROUTINE W REFLEX MICROSCOPIC
Bilirubin Urine: NEGATIVE
Glucose, UA: NEGATIVE mg/dL
Hgb urine dipstick: NEGATIVE
KETONES UR: NEGATIVE mg/dL
Leukocytes, UA: NEGATIVE
Nitrite: NEGATIVE
Protein, ur: NEGATIVE mg/dL
Specific Gravity, Urine: 1.01 (ref 1.005–1.030)
Urobilinogen, UA: 0.2 mg/dL (ref 0.0–1.0)
pH: 7 (ref 5.0–8.0)

## 2014-11-25 LAB — BASIC METABOLIC PANEL
ANION GAP: 11 (ref 5–15)
BUN: 27 mg/dL — AB (ref 6–20)
CALCIUM: 9.7 mg/dL (ref 8.9–10.3)
CO2: 23 mmol/L (ref 22–32)
Chloride: 99 mmol/L — ABNORMAL LOW (ref 101–111)
Creatinine, Ser: 1.34 mg/dL — ABNORMAL HIGH (ref 0.44–1.00)
GFR calc non Af Amer: 34 mL/min — ABNORMAL LOW (ref >60)
GFR, EST AFRICAN AMERICAN: 39 mL/min — AB (ref >60)
Glucose, Bld: 136 mg/dL — ABNORMAL HIGH (ref 65–99)
Potassium: 4.2 mmol/L (ref 3.5–5.1)
SODIUM: 133 mmol/L — AB (ref 135–145)

## 2014-11-25 LAB — CBC WITH DIFFERENTIAL/PLATELET
Basophils Absolute: 0 10*3/uL (ref 0.0–0.1)
Basophils Relative: 0 % (ref 0–1)
Eosinophils Absolute: 0.1 10*3/uL (ref 0.0–0.7)
Eosinophils Relative: 1 % (ref 0–5)
HEMATOCRIT: 32.8 % — AB (ref 36.0–46.0)
Hemoglobin: 11.6 g/dL — ABNORMAL LOW (ref 12.0–15.0)
LYMPHS ABS: 0.9 10*3/uL (ref 0.7–4.0)
Lymphocytes Relative: 6 % — ABNORMAL LOW (ref 12–46)
MCH: 31.4 pg (ref 26.0–34.0)
MCHC: 35.4 g/dL (ref 30.0–36.0)
MCV: 88.6 fL (ref 78.0–100.0)
MONO ABS: 0.5 10*3/uL (ref 0.1–1.0)
Monocytes Relative: 3 % (ref 3–12)
Neutro Abs: 13.2 10*3/uL — ABNORMAL HIGH (ref 1.7–7.7)
Neutrophils Relative %: 90 % — ABNORMAL HIGH (ref 43–77)
PLATELETS: 265 10*3/uL (ref 150–400)
RBC: 3.7 MIL/uL — AB (ref 3.87–5.11)
RDW: 11.8 % (ref 11.5–15.5)
WBC: 14.7 10*3/uL — ABNORMAL HIGH (ref 4.0–10.5)

## 2014-11-25 LAB — PROTIME-INR
INR: 1.11 (ref 0.00–1.49)
Prothrombin Time: 14.5 seconds (ref 11.6–15.2)

## 2014-11-25 LAB — APTT: aPTT: 31 seconds (ref 24–37)

## 2014-11-25 MED ORDER — PANTOPRAZOLE SODIUM 40 MG PO TBEC
40.0000 mg | DELAYED_RELEASE_TABLET | Freq: Every day | ORAL | Status: DC
  Administered 2014-11-25 – 2014-11-29 (×5): 40 mg via ORAL
  Filled 2014-11-25 (×5): qty 1

## 2014-11-25 MED ORDER — ACETAMINOPHEN 650 MG RE SUPP: 650.0000 mg | Freq: Four times a day (QID) | RECTAL | Status: DC | PRN

## 2014-11-25 MED ORDER — MORPHINE SULFATE 4 MG/ML IJ SOLN
4.0000 mg | Freq: Once | INTRAMUSCULAR | Status: AC
  Administered 2014-11-25: 4 mg via INTRAVENOUS
  Filled 2014-11-25: qty 1

## 2014-11-25 MED ORDER — UMECLIDINIUM BROMIDE 62.5 MCG/INH IN AEPB: 1.0000 | INHALATION_SPRAY | Freq: Every day | RESPIRATORY_TRACT | Status: DC

## 2014-11-25 MED ORDER — VITAMIN C 500 MG PO TABS
500.0000 mg | ORAL_TABLET | Freq: Every day | ORAL | Status: DC
  Administered 2014-11-27 – 2014-11-29 (×3): 500 mg via ORAL
  Filled 2014-11-25 (×3): qty 1

## 2014-11-25 MED ORDER — OXYCODONE-ACETAMINOPHEN 5-325 MG PO TABS: 1.0000 | ORAL_TABLET | ORAL | Status: DC | PRN

## 2014-11-25 MED ORDER — CALCIUM CITRATE-VITAMIN D 315-200 MG-UNIT PO TABS: 1.0000 | ORAL_TABLET | Freq: Every day | ORAL | Status: DC

## 2014-11-25 MED ORDER — CALCIUM CARBONATE-VITAMIN D 500-200 MG-UNIT PO TABS
1.0000 | ORAL_TABLET | Freq: Every day | ORAL | Status: DC
  Administered 2014-11-27 – 2014-11-29 (×3): 1 via ORAL
  Filled 2014-11-25 (×3): qty 1

## 2014-11-25 MED ORDER — GLUCOSAMINE HCL 1000 MG PO TABS: 1000.0000 mg | ORAL_TABLET | Freq: Every day | ORAL | Status: DC

## 2014-11-25 MED ORDER — LOSARTAN POTASSIUM 50 MG PO TABS
100.0000 mg | ORAL_TABLET | Freq: Every day | ORAL | Status: DC
  Administered 2014-11-25 – 2014-11-27 (×3): 100 mg via ORAL
  Filled 2014-11-25 (×4): qty 2

## 2014-11-25 MED ORDER — LUTEIN 20 MG PO CAPS: 25.0000 mg | ORAL_CAPSULE | Freq: Every day | ORAL | Status: DC

## 2014-11-25 MED ORDER — HYDRALAZINE HCL 20 MG/ML IJ SOLN
5.0000 mg | INTRAMUSCULAR | Status: DC | PRN
  Administered 2014-11-26: 5 mg via INTRAVENOUS
  Filled 2014-11-25: qty 1

## 2014-11-25 MED ORDER — ACETAMINOPHEN 325 MG PO TABS: 650.0000 mg | ORAL_TABLET | Freq: Four times a day (QID) | ORAL | Status: DC | PRN

## 2014-11-25 MED ORDER — SODIUM CHLORIDE 0.9 % IV SOLN
INTRAVENOUS | Status: DC
  Administered 2014-11-26: 22:00:00 via INTRAVENOUS

## 2014-11-25 MED ORDER — CEFAZOLIN SODIUM-DEXTROSE 2-3 GM-% IV SOLR
2.0000 g | INTRAVENOUS | Status: AC
  Administered 2014-11-26: 2 g via INTRAVENOUS
  Filled 2014-11-25 (×2): qty 50

## 2014-11-25 MED ORDER — ONDANSETRON HCL 4 MG/2ML IJ SOLN
4.0000 mg | Freq: Four times a day (QID) | INTRAMUSCULAR | Status: DC | PRN
  Administered 2014-11-25 – 2014-11-27 (×5): 4 mg via INTRAVENOUS
  Filled 2014-11-25 (×5): qty 2

## 2014-11-25 MED ORDER — ALUM & MAG HYDROXIDE-SIMETH 200-200-20 MG/5ML PO SUSP: 30.0000 mL | Freq: Four times a day (QID) | ORAL | Status: DC | PRN

## 2014-11-25 MED ORDER — ASPIRIN 81 MG PO CHEW
81.0000 mg | CHEWABLE_TABLET | Freq: Every day | ORAL | Status: DC
  Administered 2014-11-27 – 2014-11-29 (×3): 81 mg via ORAL
  Filled 2014-11-25 (×4): qty 1

## 2014-11-25 MED ORDER — DM-GUAIFENESIN ER 30-600 MG PO TB12
1.0000 | ORAL_TABLET | Freq: Two times a day (BID) | ORAL | Status: DC
  Administered 2014-11-28 – 2014-11-29 (×2): 1 via ORAL
  Filled 2014-11-25 (×9): qty 1

## 2014-11-25 MED ORDER — MORPHINE SULFATE 2 MG/ML IJ SOLN
2.0000 mg | INTRAMUSCULAR | Status: DC | PRN
Start: 2014-11-25 — End: 2014-11-28
  Administered 2014-11-25 – 2014-11-26 (×2): 2 mg via INTRAVENOUS
  Administered 2014-11-26: 1 mg via INTRAVENOUS
  Filled 2014-11-25 (×3): qty 1

## 2014-11-25 MED ORDER — SODIUM CHLORIDE 0.9 % IV SOLN
INTRAVENOUS | Status: DC
  Administered 2014-11-25: 23:00:00 via INTRAVENOUS

## 2014-11-25 MED ORDER — CARVEDILOL 6.25 MG PO TABS
6.2500 mg | ORAL_TABLET | Freq: Two times a day (BID) | ORAL | Status: DC
  Administered 2014-11-26 – 2014-11-29 (×6): 6.25 mg via ORAL
  Filled 2014-11-25 (×6): qty 1

## 2014-11-25 MED ORDER — LATANOPROST 0.005 % OP SOLN
1.0000 [drp] | Freq: Every day | OPHTHALMIC | Status: DC
  Administered 2014-11-25 – 2014-11-28 (×4): 1 [drp] via OPHTHALMIC
  Filled 2014-11-25 (×2): qty 2.5

## 2014-11-25 NOTE — ED Notes (Signed)
Main lab called for recollect.

## 2014-11-25 NOTE — ED Notes (Signed)
CBC recollected by main lab @ this time.

## 2014-11-25 NOTE — H&P (Signed)
[facility] History and Physical  Stephanie Martinez GUY:[id number] DOB: 1922/01/08 DOA: 11/25/2014  Referring physician: ED physician PCP: Scarlette Calico, MD  Specialists:   Chief Complaint: right arm pain after fall.   HPI: Stephanie Martinez is a 81 y.o. female with PMH of hypertension, hyperlipidemia, GERD history of breast cancer (post status of lumpectomy and radiation therapy), history of colon cancer (post status of resection), vertigo, osteoarthritis, chronic kidney disease-stage III, who presents with right arm pain after a fall.  Patient reports that she had accidental fall after turning while walking and losing balance at home at about 2:30 PM. No chest pain, weakness, LOC prior to fall.  She fell onto her right side injuring her upper arm, then to back, hitting her head against the hardwood floor. She developed a small hematoma over right frontal area, and tenderness over right upper arm and right lower rib cage. She denies neck pain, SOB and abdominal pain. There has been no nausea or vomiting. She moves lower extremities without difficulty or complaint of pain.   Currently patient denies fever, chills, running nose, ear pain, headaches, SOB, abdominal pain, diarrhea, constipation, dysuria, urgency, frequency, hematuria, skin rashes or leg swelling. No unilateral weakness, numbness or tingling sensations. No vision change or hearing loss.    In ED, patient was found to have fracture of the mid to distal right humerus by X-ray. CT head and C-spine are negative for acute abnormalities. WBC 14.7, temperature normal, no tachycardia, stable renal function. Patient is admitted to inpatient for further evaluation and treatment per orthopedic surgeon was consulted by ED.  Where does patient live?   At home    Can patient participate in ADLs?  barely  Review of Systems:   General: no fevers, chills, no changes in body weight, has fatigue HEENT: no blurry vision, hearing changes or  sore throat Pulm: no dyspnea, has mild coughing, no wheezing CV: no chest pain, palpitations Abd: no nausea, vomiting, abdominal pain, diarrhea, constipation GU: no dysuria, burning on urination, increased urinary frequency, hematuria  Ext: no leg edema Neuro: no unilateral weakness, numbness, or tingling, no vision change or hearing loss Skin: no rash MSK: has pain over right upper arm and right lower rib cage. no limitation of range of movement in spin Heme: No easy bruising.  Travel history: No recent long distant travel.  Allergy:  Allergies  Allergen Reactions  . Iodine Anaphylaxis    Past Medical History  Diagnosis Date  . Glaucoma   . Arthritis   . Hyperlipidemia   . Hypertension   . Meniere disease   . GERD (gastroesophageal reflux disease)   . Breast cancer     lumpectomy and radiation  . Vertigo     doing PT   . Colon cancer     resection  . Overactive thyroid gland     treated with liquid iodine  . Osteoporosis   . CKD (chronic kidney disease), stage III     Past Surgical History  Procedure Laterality Date  . Breast surgery      lumpectomy-left  . Appendectomy    . Spine surgery      x2  . Small intestine surgery      resection  . Total hip arthroplasty      bilateral one 20 years ago, other 8-10 years ago  . Tonsillectomy and adenoidectomy  before age 35    Social History:  reports that she has quit smoking. She has never used smokeless tobacco.  She reports that she drinks about 2.5 - 3.0 oz of alcohol per week. She reports that she does not use illicit drugs.  Family History:  Family History  Problem Relation Age of Onset  . Stroke Mother   . Colon cancer Father     dx in 48s  . Lung cancer Sister 60  . Heart disease Neg Hx   . Hyperlipidemia Neg Hx   . Hypertension Other     materal side  . Breast cancer Paternal Aunt     <50  . BRCA 1/2 Daughter     BRCA2 positive  . BRCA 1/2 Daughter     BRCA2 positive  . Breast cancer Sister 46     bilateral breast cancer, 2nd dx at 31  . Colon cancer Sister 12  . Breast cancer Sister 13    bilateral breast cancer; 2nd dx age 49  . Leukemia Other     dx in his 35s, youngest sister's son  . Thyroid disease Daughter      Prior to Admission medications   Medication Sig Start Date End Date Taking? Authorizing Provider  aspirin 81 MG tablet Take 81 mg by mouth daily.   Yes Historical Provider, MD  calcium citrate-vitamin D (CITRACAL+D) 315-200 MG-UNIT per tablet Take 1 tablet by mouth daily.   Yes Historical Provider, MD  carvedilol (COREG) 6.25 MG tablet Take 1 tablet (6.25 mg total) by mouth 2 (two) times daily with a meal. 11/14/14  Yes Janith Lima, MD  GLUCOSAMINE HCL PO Take 1,000 mg by mouth daily.   Yes Historical Provider, MD  latanoprost (XALATAN) 0.005 % ophthalmic solution Place 1 drop into both eyes at bedtime.    Yes Historical Provider, MD  losartan-hydrochlorothiazide (HYZAAR) 100-12.5 MG per tablet Take 1 tablet by mouth daily. 08/13/14  Yes Janith Lima, MD  LUTEIN PO Take 25 mg by mouth daily.   Yes Historical Provider, MD  omeprazole (PRILOSEC) 20 MG capsule TAKE 1 CAPSULE BY MOUTH DAILY   Yes Janith Lima, MD  traMADol (ULTRAM) 50 MG tablet Take 1 tablet (50 mg total) by mouth every 8 (eight) hours as needed. 11/21/13  Yes Janith Lima, MD  Umeclidinium Bromide (INCRUSE ELLIPTA) 62.5 MCG/INH AEPB Inhale 1 puff into the lungs daily. 11/14/14  Yes Janith Lima, MD  vitamin C (ASCORBIC ACID) 500 MG tablet Take 500 mg by mouth daily.   Yes Historical Provider, MD    Physical Exam: Filed Vitals:   11/25/14 1736 11/25/14 1952 11/25/14 2000 11/25/14 2033  BP: 206/86 199/85 189/96 184/88  Pulse: 68 72 72 79  Temp:      TempSrc:      Resp: 18 16    SpO2: 100% 100% 98% 99%   General: Not in acute distress HEENT: There is a small hematoma over right frontal area       Eyes: PERRL, EOMI, no scleral icterus.       ENT: No discharge from the ears and nose, no  pharynx injection, no tonsillar enlargement.        Neck: No JVD, no bruit, no mass felt. Heme: No neck lymph node enlargement. Cardiac: S1/S2, RRR, No murmurs, No gallops or rubs. Pulm: Good air movement bilaterally. No rales, wheezing, rhonchi or rubs. Abd: Soft, nondistended, nontender, no rebound pain, no organomegaly, BS present. Ext: No pitting leg edema bilaterally. 2+DP/PT pulse bilaterally. Musculoskeletal: Bony deformity of right upper arm midshaft humerus with tenderness, pulse and sensation intact. There  is tenderness over right lower rib cage.  Skin: No rashes.  Neuro: Alert, oriented X3, cranial nerves II-XII grossly intact, muscle strength 5/5 in all extremities, sensation to light touch intact.  Psych: Patient is not psychotic, no suicidal or hemocidal ideation.  Labs on Admission:  Basic Metabolic Panel:  Recent Labs Lab 11/25/14 1655  NA 133*  K 4.2  CL 99*  CO2 23  GLUCOSE 136*  BUN 27*  CREATININE 1.34*  CALCIUM 9.7   Liver Function Tests: No results for input(s): AST, ALT, ALKPHOS, BILITOT, PROT, ALBUMIN in the last 168 hours. No results for input(s): LIPASE, AMYLASE in the last 168 hours. No results for input(s): AMMONIA in the last 168 hours. CBC:  Recent Labs Lab 11/25/14 1801  WBC 14.7*  NEUTROABS 13.2*  HGB 11.6*  HCT 32.8*  MCV 88.6  PLT 265   Cardiac Enzymes: No results for input(s): CKTOTAL, CKMB, CKMBINDEX, TROPONINI in the last 168 hours.  BNP (last 3 results) No results for input(s): BNP in the last 8760 hours.  ProBNP (last 3 results) No results for input(s): PROBNP in the last 8760 hours.  CBG: No results for input(s): GLUCAP in the last 168 hours.  Radiological Exams on Admission: Ct Head Wo Contrast  11/25/2014   CLINICAL DATA:  Fall, RIGHT arm pain and deformity, struck head, no loss of consciousness, history hypertension, breast cancer, colon cancer  EXAM: CT HEAD WITHOUT CONTRAST  CT CERVICAL SPINE WITHOUT CONTRAST   TECHNIQUE: Multidetector CT imaging of the head and cervical spine was performed following the standard protocol without intravenous contrast. Multiplanar CT image reconstructions of the cervical spine were also generated.  COMPARISON:  CT head 02/15/2012  FINDINGS: CT HEAD FINDINGS  Generalized atrophy.  Normal ventricular morphology.  No midline shift or mass effect.  Small vessel chronic ischemic changes of deep cerebral white matter.  No intracranial hemorrhage, mass lesion, or acute infarction.  Visualized paranasal sinuses and mastoid air cells clear.  Bones demineralized.  Small RIGHT frontal scalp hematoma.  Atherosclerotic calcifications at carotid siphons.  CT CERVICAL SPINE FINDINGS  Bones demineralized.  Prevertebral soft tissues normal thickness.  Vertebral body heights maintained.  Disc space narrowing C5-C6 and C6-C7 with minimal retrolisthesis at C5-C6.  Multilevel facet degenerative changes.  Visualized skullbase intact.  No acute fracture, additional subluxation or bone destruction.  Bone island C3 vertebral body.  Lung apices clear.  Tiny nonspecific RIGHT thyroid nodules.  IMPRESSION: No acute intracranial abnormalities.  Atrophy with mild small vessel chronic ischemic changes of deep cerebral white matter.  Osseous demineralization with degenerative disc and facet disease changes cervical spine and mild retrolisthesis at C5-C6.  No acute cervical spine abnormalities.   Electronically Signed   By: Lavonia Dana M.D.   On: 11/25/2014 17:24   Ct Cervical Spine Wo Contrast  11/25/2014   CLINICAL DATA:  Fall, RIGHT arm pain and deformity, struck head, no loss of consciousness, history hypertension, breast cancer, colon cancer  EXAM: CT HEAD WITHOUT CONTRAST  CT CERVICAL SPINE WITHOUT CONTRAST  TECHNIQUE: Multidetector CT imaging of the head and cervical spine was performed following the standard protocol without intravenous contrast. Multiplanar CT image reconstructions of the cervical spine were  also generated.  COMPARISON:  CT head 02/15/2012  FINDINGS: CT HEAD FINDINGS  Generalized atrophy.  Normal ventricular morphology.  No midline shift or mass effect.  Small vessel chronic ischemic changes of deep cerebral white matter.  No intracranial hemorrhage, mass lesion, or acute infarction.  Visualized  paranasal sinuses and mastoid air cells clear.  Bones demineralized.  Small RIGHT frontal scalp hematoma.  Atherosclerotic calcifications at carotid siphons.  CT CERVICAL SPINE FINDINGS  Bones demineralized.  Prevertebral soft tissues normal thickness.  Vertebral body heights maintained.  Disc space narrowing C5-C6 and C6-C7 with minimal retrolisthesis at C5-C6.  Multilevel facet degenerative changes.  Visualized skullbase intact.  No acute fracture, additional subluxation or bone destruction.  Bone island C3 vertebral body.  Lung apices clear.  Tiny nonspecific RIGHT thyroid nodules.  IMPRESSION: No acute intracranial abnormalities.  Atrophy with mild small vessel chronic ischemic changes of deep cerebral white matter.  Osseous demineralization with degenerative disc and facet disease changes cervical spine and mild retrolisthesis at C5-C6.  No acute cervical spine abnormalities.   Electronically Signed   By: Lavonia Dana M.D.   On: 11/25/2014 17:24   Dg Humerus Right  11/25/2014   CLINICAL DATA:  Fall with significant distal arm pain and deformity  EXAM: RIGHT HUMERUS - 2+ VIEW  COMPARISON:  None.  FINDINGS: There is an oblique comminuted fracture through the mid to distal right humerus. Some posterior and lateral displacement of the distal fracture fragment is noted. No other fractures are seen.  IMPRESSION: Fracture of the mid to distal right humerus   Electronically Signed   By: Inez Catalina M.D.   On: 11/25/2014 17:36    EKG: Not done in ED, will get one.   Assessment/Plan Principal Problem:   Humerus distal fracture Active Problems:   Essential hypertension, benign   Pure hypercholesterolemia    GERD (gastroesophageal reflux disease)   Low back pain   CKD (chronic kidney disease), stage III  Right humor fracture: As evidenced by x-ray. Patient has moderate pain now. No neurovascular compromise. Orthopedic surgeon was consulted.  - will admit to tele bed - Pain control: morphine prn and percocet - follow up ortho recs - NPO and NS 75cc/h - type and cross - INR/PTT - get CXR to r/o rib fracture give tenderness over right lower rib cage  Leukocytosis: Likely due to stress-induced demargination. Patient does not have signs of infection. -Follow-up CBC  Essential hypertension: Blood pressure is elevated, SBP 206-->186 now. No chest pain or SOB. likely due to pain. -Switch Hyzaar to losartan while patient is on NPO and needs IV fluid -continue coreg -IV hydralazine when necessary  HLD: Last LDL was 149 on 11/21/13. Not on medications at home. -Check FLP  GERD: -Protonix  CKD (chronic kidney disease), stage III: Stable. Baseline creatinine 1.2 to 1.4. Her creatinine is 1.34, which is at baseline. -Follow-up renal function by BMP   DVT ppx: SCD  Code Status: DNR Family Communication:   Yes, patient's daughter  at bed side Disposition Plan: Admit to inpatient   Date of Service 11/25/2014    Ivor Costa [facility] Pager [phone]  If 7PM-7AM, please contact night-coverage [email or web address] Password [facility] 11/25/2014, 8:48 PM

## 2014-11-25 NOTE — ED Notes (Signed)
[place], RN, receiving rn made aware [facility] is here to transfer and the pt is still pending a CT of humerus on arrival to [facility] for admission. Pt has had a xray chest completed

## 2014-11-25 NOTE — ED Notes (Signed)
Lab draw unsuccessful RN made aware 

## 2014-11-25 NOTE — Clinical Social Work Note (Signed)
Clinical Social Work Assessment  Patient Details  Name: Stephanie Martinez MRN: [id number] Date of Birth: 04/18/1922  Date of referral:  11/25/14               Reason for consult:   (Fall.)                Permission sought to share information with:   (None.) Permission granted to share information::  No  Name::        Agency::     Relationship::     Contact Information:     Housing/Transportation Living arrangements for the past 2 months:  Single Family Home (Patient states that she lives with daughter in [place].) Source of Information:  Patient, Adult Children Patient Interpreter Needed:  None Criminal Activity/Legal Involvement Pertinent to Current Situation/Hospitalization:  No - Comment as needed Significant Relationships:  Adult Children (Daughter/Nancy Helm [phone]) Lives with:  Adult Children Do you feel safe going back to the place where you live?  Yes Need for family participation in patient care:  Yes (Comment) (CSW believes it to be helpful if familycontimues to particiapte in pt care.)  Care giving concerns:  The pt is currently living with her daughter in [place]. She and daughter state that they are not interested in the pt going to a facility at this time.   Social Worker assessment / plan:  CSW met with pt at bedside. Daughter was present. Patient confirms that she presents to [facility] due to fall. Patient stated " I think I had rubber sole shoes and I stepped short. I have pretty lousy balance anyway.  Patient informed CSW that she always uses a walker to ambulate. Patient informed CSW that she has a condition that caused her to pass out and have seizures sometimes. However, pt states that she never stands from a falling position and that she does not fall often.  Daughter informed CSW that the pt is having surgery tomorrow on her arm.  Patient states she is not interested in a facility. She states that she could complete her ADL's prior to  fall.  Patient and daughter state that they do not have any questions at this time.   Employment status:  Retired Insurance information:   ([employer].) PT Recommendations:  Not assessed at this time Information / Referral to community resources:   (Patient and sdaughter state that they are not interested in facility at this time.)  Patient/Family's Response to care:  Patient and daughters response is appropriate at this time. They are accepting. Pt and and daughter are aware that she will be admitted.  Patient/Family's Understanding of and Emotional Response to Diagnosis, Current Treatment, and Prognosis:  Patient is understanding of her diagnosis. She is aware that she will be admitted.   Emotional Assessment Appearance:  Appears stated age Attitude/Demeanor/Rapport:   (Well Mannered. Appropriate.) Affect (typically observed):  Accepting, Appropriate Orientation:  Oriented to Self, Oriented to Place, Oriented to  Time, Oriented to Situation Alcohol / Substance use:  Alcohol Use (Patient informed CSW that she drinks wine sometimes.) Psych involvement (Current and /or in the community):  No (Comment)  Discharge Needs  Concerns to be addressed:  Adjustment to Illness Readmission within the last 30 days:  No Current discharge risk:  None Barriers to Discharge:  No Barriers Identified   Whitaker, Brittney R, LCSW 11/25/2014, 10:34 PM  

## 2014-11-25 NOTE — ED Notes (Signed)
Per EMS pt comes from for fall.  Pt c/o right arm pain, deformity noted so sling in place.  Pt has 20g in left AC. Was given 168mcg of Fentanl in route, and Zofran 4mg .  Pain went from 15 to a 9.  Pt did hit her head when she fell, but not taking in blood thinners.

## 2014-11-25 NOTE — ED Notes (Signed)
Per shari upstill, pa okay for ct and xray to be done at [facility]

## 2014-11-25 NOTE — ED Provider Notes (Signed)
CSN: [id number]     Arrival date & time 11/25/14  1508 History   First MD Initiated Contact with Patient 11/25/14 1521     Chief Complaint  Patient presents with  . Arm Pain  . Fall     (Consider location/radiation/quality/duration/timing/severity/associated sxs/prior Treatment) Patient is a 81 y.o. female presenting with arm pain and fall. The history is provided by the patient. No language interpreter was used.  Arm Pain This is a new problem. The current episode started today. Associated symptoms include headaches. Pertinent negatives include no abdominal pain, chest pain, fever, neck pain or vomiting. Associated symptoms comments: The patient is here with daughter and reports mechanical fall after turning while walking and losing balance. No chest pain, dizziness prior to fall and no syncope. She fell onto her right side injuring her upper arm, then to back, hitting her head against the hardwood floor. She denies neck pain, current chest pain or SOB, abdominal pain. There has been no nausea or vomiting. She moves lower extremities without difficulty or complaint of pain. .  Fall Associated symptoms include headaches. Pertinent negatives include no abdominal pain, chest pain, fever, neck pain or vomiting.    Past Medical History  Diagnosis Date  . Glaucoma   . Arthritis   . Hyperlipidemia   . Hypertension   . Meniere disease   . GERD (gastroesophageal reflux disease)   . Breast cancer     lumpectomy and radiation  . Vertigo     doing PT   . Colon cancer     resection  . Overactive thyroid gland     treated with liquid iodine  . Osteoporosis    Past Surgical History  Procedure Laterality Date  . Breast surgery      lumpectomy-left  . Appendectomy    . Spine surgery      x2  . Small intestine surgery      resection  . Total hip arthroplasty      bilateral one 20 years ago, other 8-10 years ago  . Tonsillectomy and adenoidectomy  before age 39   Family History  Problem  Relation Age of Onset  . Stroke Mother   . Colon cancer Father     dx in 61s  . Lung cancer Sister 53  . Heart disease Neg Hx   . Hyperlipidemia Neg Hx   . Hypertension Other     materal side  . Breast cancer Paternal Aunt     <50  . BRCA 1/2 Daughter     BRCA2 positive  . BRCA 1/2 Daughter     BRCA2 positive  . Breast cancer Sister 44    bilateral breast cancer, 2nd dx at 35  . Colon cancer Sister 41  . Breast cancer Sister 66    bilateral breast cancer; 2nd dx age 19  . Leukemia Other     dx in his 42s, youngest sister's son  . Thyroid disease Daughter    History  Substance Use Topics  . Smoking status: Former Research scientist (life sciences)  . Smokeless tobacco: Never Used     Comment: in her early 48s  . Alcohol Use: 2.5 - 3.0 oz/week    5-6 drink(s) per week   OB History    Gravida Para Term Preterm AB TAB SAB Ectopic Multiple Living   '2 2        2      '$ Obstetric Comments   ? If had MAB     Review of Systems  Constitutional: Negative for fever.  Eyes: Negative for visual disturbance.  Respiratory: Negative for shortness of breath.   Cardiovascular: Negative for chest pain.  Gastrointestinal: Negative for vomiting and abdominal pain.  Musculoskeletal: Negative for neck pain.       See HPI.  Skin: Negative for wound.  Neurological: Positive for headaches. Negative for dizziness and syncope.  Psychiatric/Behavioral: Negative for confusion.      Allergies  Iodine  Home Medications   Prior to Admission medications   Medication Sig Start Date End Date Taking? Authorizing Provider  aspirin 81 MG tablet Take 81 mg by mouth daily.   Yes Historical Provider, MD  calcium citrate-vitamin D (CITRACAL+D) 315-200 MG-UNIT per tablet Take 1 tablet by mouth daily.   Yes Historical Provider, MD  carvedilol (COREG) 6.25 MG tablet Take 1 tablet (6.25 mg total) by mouth 2 (two) times daily with a meal. 11/14/14  Yes Janith Lima, MD  GLUCOSAMINE HCL PO Take 1,000 mg by mouth daily.   Yes  Historical Provider, MD  latanoprost (XALATAN) 0.005 % ophthalmic solution Place 1 drop into both eyes at bedtime.    Yes Historical Provider, MD  losartan-hydrochlorothiazide (HYZAAR) 100-12.5 MG per tablet Take 1 tablet by mouth daily. 08/13/14  Yes Janith Lima, MD  LUTEIN PO Take 25 mg by mouth daily.   Yes Historical Provider, MD  omeprazole (PRILOSEC) 20 MG capsule TAKE 1 CAPSULE BY MOUTH DAILY   Yes Janith Lima, MD  traMADol (ULTRAM) 50 MG tablet Take 1 tablet (50 mg total) by mouth every 8 (eight) hours as needed. 11/21/13  Yes Janith Lima, MD  Umeclidinium Bromide (INCRUSE ELLIPTA) 62.5 MCG/INH AEPB Inhale 1 puff into the lungs daily. 11/14/14  Yes Janith Lima, MD  vitamin C (ASCORBIC ACID) 500 MG tablet Take 500 mg by mouth daily.   Yes Historical Provider, MD   BP 196/89 mmHg  Pulse 66  Temp(Src) 97.4 F (36.3 C) (Oral)  Resp 24  SpO2 96% Physical Exam  Constitutional: She is oriented to person, place, and time. She appears well-developed and well-nourished.  HENT:  Head: Normocephalic.  No scalp hematoma or wound.  Neck: Normal range of motion. Neck supple.  Cardiovascular: Normal rate and regular rhythm.   Pulmonary/Chest: Effort normal and breath sounds normal.  Abdominal: Soft. Bowel sounds are normal. There is no tenderness. There is no rebound and no guarding.  Musculoskeletal: Normal range of motion.  Bony deformity of right upper arm midshaft humerus. Closed injury. No midline or paracervical tenderness. No elbow or wrist tenderness or loss of motion. Left arm nontender with FROM. No hip or pelvic tenderness. FROM LE's bilaterally.  Neurological: She is alert and oriented to person, place, and time.  Skin: Skin is warm and dry. No rash noted.  Psychiatric: She has a normal mood and affect.    ED Course  Procedures (including critical care time) Labs Review Labs Reviewed  CBC WITH DIFFERENTIAL/PLATELET  BASIC METABOLIC PANEL   Results for orders placed  or performed during the hospital encounter of 38/75/64  Basic metabolic panel  Result Value Ref Range   Sodium 133 (L) 135 - 145 mmol/L   Potassium 4.2 3.5 - 5.1 mmol/L   Chloride 99 (L) 101 - 111 mmol/L   CO2 23 22 - 32 mmol/L   Glucose, Bld 136 (H) 65 - 99 mg/dL   BUN 27 (H) 6 - 20 mg/dL   Creatinine, Ser 1.34 (H) 0.44 - 1.00 mg/dL  Calcium 9.7 8.9 - 10.3 mg/dL   GFR calc non Af Amer 34 (L) >60 mL/min   GFR calc Af Amer 39 (L) >60 mL/min   Anion gap 11 5 - 15  Urinalysis, Routine w reflex microscopic (not at [facility])  Result Value Ref Range   Color, Urine YELLOW YELLOW   APPearance CLEAR CLEAR   Specific Gravity, Urine 1.010 1.005 - 1.030   pH 7.0 5.0 - 8.0   Glucose, UA NEGATIVE NEGATIVE mg/dL   Hgb urine dipstick NEGATIVE NEGATIVE   Bilirubin Urine NEGATIVE NEGATIVE   Ketones, ur NEGATIVE NEGATIVE mg/dL   Protein, ur NEGATIVE NEGATIVE mg/dL   Urobilinogen, UA 0.2 0.0 - 1.0 mg/dL   Nitrite NEGATIVE NEGATIVE   Leukocytes, UA NEGATIVE NEGATIVE  CBC with Differential/Platelet  Result Value Ref Range   WBC 14.7 (H) 4.0 - 10.5 K/uL   RBC 3.70 (L) 3.87 - 5.11 MIL/uL   Hemoglobin 11.6 (L) 12.0 - 15.0 g/dL   HCT 32.8 (L) 36.0 - 46.0 %   MCV 88.6 78.0 - 100.0 fL   MCH 31.4 26.0 - 34.0 pg   MCHC 35.4 30.0 - 36.0 g/dL   RDW 11.8 11.5 - 15.5 %   Platelets 265 150 - 400 K/uL   Neutrophils Relative % 90 (H) 43 - 77 %   Neutro Abs 13.2 (H) 1.7 - 7.7 K/uL   Lymphocytes Relative 6 (L) 12 - 46 %   Lymphs Abs 0.9 0.7 - 4.0 K/uL   Monocytes Relative 3 3 - 12 %   Monocytes Absolute 0.5 0.1 - 1.0 K/uL   Eosinophils Relative 1 0 - 5 %   Eosinophils Absolute 0.1 0.0 - 0.7 K/uL   Basophils Relative 0 0 - 1 %   Basophils Absolute 0.0 0.0 - 0.1 K/uL   Dg Chest 2 View  11/15/2014   CLINICAL DATA:  Cough for 3 months.  EXAM: CHEST  2 VIEW  COMPARISON:  01/05/2013  FINDINGS: Mediastinum hilar structures are normal. Stable bibasilar subsegmental atelectasis and/or pleural parenchymal scarring.  COPD . No pleural effusion or pneumothorax. Heart size stable. No acute bony abnormality. Degenerative changes thoracic spine.  IMPRESSION: Stable bibasilar subsegmental atelectasis and/or pleural parenchymal scarring. COPD. No acute cardiopulmonary disease identified.   Electronically Signed   By: Marcello Moores  Register   On: 11/15/2014 08:05   Ct Head Wo Contrast  11/25/2014   CLINICAL DATA:  Fall, RIGHT arm pain and deformity, struck head, no loss of consciousness, history hypertension, breast cancer, colon cancer  EXAM: CT HEAD WITHOUT CONTRAST  CT CERVICAL SPINE WITHOUT CONTRAST  TECHNIQUE: Multidetector CT imaging of the head and cervical spine was performed following the standard protocol without intravenous contrast. Multiplanar CT image reconstructions of the cervical spine were also generated.  COMPARISON:  CT head 02/15/2012  FINDINGS: CT HEAD FINDINGS  Generalized atrophy.  Normal ventricular morphology.  No midline shift or mass effect.  Small vessel chronic ischemic changes of deep cerebral white matter.  No intracranial hemorrhage, mass lesion, or acute infarction.  Visualized paranasal sinuses and mastoid air cells clear.  Bones demineralized.  Small RIGHT frontal scalp hematoma.  Atherosclerotic calcifications at carotid siphons.  CT CERVICAL SPINE FINDINGS  Bones demineralized.  Prevertebral soft tissues normal thickness.  Vertebral body heights maintained.  Disc space narrowing C5-C6 and C6-C7 with minimal retrolisthesis at C5-C6.  Multilevel facet degenerative changes.  Visualized skullbase intact.  No acute fracture, additional subluxation or bone destruction.  Bone island C3 vertebral body.  Lung apices clear.  Tiny nonspecific RIGHT thyroid nodules.  IMPRESSION: No acute intracranial abnormalities.  Atrophy with mild small vessel chronic ischemic changes of deep cerebral white matter.  Osseous demineralization with degenerative disc and facet disease changes cervical spine and mild retrolisthesis  at C5-C6.  No acute cervical spine abnormalities.   Electronically Signed   By: Lavonia Dana M.D.   On: 11/25/2014 17:24   Ct Cervical Spine Wo Contrast  11/25/2014   CLINICAL DATA:  Fall, RIGHT arm pain and deformity, struck head, no loss of consciousness, history hypertension, breast cancer, colon cancer  EXAM: CT HEAD WITHOUT CONTRAST  CT CERVICAL SPINE WITHOUT CONTRAST  TECHNIQUE: Multidetector CT imaging of the head and cervical spine was performed following the standard protocol without intravenous contrast. Multiplanar CT image reconstructions of the cervical spine were also generated.  COMPARISON:  CT head 02/15/2012  FINDINGS: CT HEAD FINDINGS  Generalized atrophy.  Normal ventricular morphology.  No midline shift or mass effect.  Small vessel chronic ischemic changes of deep cerebral white matter.  No intracranial hemorrhage, mass lesion, or acute infarction.  Visualized paranasal sinuses and mastoid air cells clear.  Bones demineralized.  Small RIGHT frontal scalp hematoma.  Atherosclerotic calcifications at carotid siphons.  CT CERVICAL SPINE FINDINGS  Bones demineralized.  Prevertebral soft tissues normal thickness.  Vertebral body heights maintained.  Disc space narrowing C5-C6 and C6-C7 with minimal retrolisthesis at C5-C6.  Multilevel facet degenerative changes.  Visualized skullbase intact.  No acute fracture, additional subluxation or bone destruction.  Bone island C3 vertebral body.  Lung apices clear.  Tiny nonspecific RIGHT thyroid nodules.  IMPRESSION: No acute intracranial abnormalities.  Atrophy with mild small vessel chronic ischemic changes of deep cerebral white matter.  Osseous demineralization with degenerative disc and facet disease changes cervical spine and mild retrolisthesis at C5-C6.  No acute cervical spine abnormalities.   Electronically Signed   By: Lavonia Dana M.D.   On: 11/25/2014 17:24   Dg Humerus Right  11/25/2014   CLINICAL DATA:  Fall with significant distal arm pain  and deformity  EXAM: RIGHT HUMERUS - 2+ VIEW  COMPARISON:  None.  FINDINGS: There is an oblique comminuted fracture through the mid to distal right humerus. Some posterior and lateral displacement of the distal fracture fragment is noted. No other fractures are seen.  IMPRESSION: Fracture of the mid to distal right humerus   Electronically Signed   By: Inez Catalina M.D.   On: 11/25/2014 17:36     Imaging Review No results found.   EKG Interpretation None      MDM   Final diagnoses:  None    1. Fall  Per daughter, the patient is able to give reliable history.   Patient's pain managed. Resting comfortably on multiple re-evaluations. VSS.  Humerus fracture discussed with Dr. Marcelino Scot who requests the patient be admitted to [facility] by [facility]. He will provide consultation and anticipates surgery tomorrow. Discussed with Dr. Blaine Hamper with [facility] who accepts the patient for admission - Dr. Hal Hope admitting for [facility]. Transfer arranged. All questions answered for family/patient. Stable for transfer.     Charlann Lange, PA-C 11/25/14 2103  Carmin Muskrat, MD 11/25/14 [phone]

## 2014-11-25 NOTE — ED Notes (Signed)
Below orders not completed by EW. 

## 2014-11-25 NOTE — ED Notes (Signed)
Did not collect labs yet patient in pain and nurse made aware.

## 2014-11-25 NOTE — ED Notes (Signed)
Bed: [facility] Expected date:  Expected time:  Means of arrival:  Comments: EMS - Fall with deformity

## 2014-11-26 ENCOUNTER — Inpatient Hospital Stay (HOSPITAL_COMMUNITY): Payer: Medicare Other

## 2014-11-26 ENCOUNTER — Inpatient Hospital Stay (HOSPITAL_COMMUNITY): Payer: Medicare Other | Admitting: Anesthesiology

## 2014-11-26 ENCOUNTER — Encounter (HOSPITAL_COMMUNITY): Payer: Self-pay | Admitting: Anesthesiology

## 2014-11-26 ENCOUNTER — Encounter (HOSPITAL_COMMUNITY)
Admission: EM | Disposition: A | Discharge status: Home Health | Payer: Self-pay | Source: Home / Self Care | Attending: Internal Medicine

## 2014-11-26 DIAGNOSIS — S42409S Unspecified fracture of lower end of unspecified humerus, sequela: Secondary | ICD-10-CM

## 2014-11-26 HISTORY — PX: ORIF HUMERUS FRACTURE: SHX2126

## 2014-11-26 LAB — CBC
HCT: 26.9 % — ABNORMAL LOW (ref 36.0–46.0)
HCT: 30.3 % — ABNORMAL LOW (ref 36.0–46.0)
Hemoglobin: 9.5 g/dL — ABNORMAL LOW (ref 12.0–15.0)
Hemoglobin: 10.8 g/dL — ABNORMAL LOW (ref 12.0–15.0)
MCH: 31.5 pg (ref 26.0–34.0)
MCH: 31.9 pg (ref 26.0–34.0)
MCHC: 35.3 g/dL (ref 30.0–36.0)
MCHC: 35.6 g/dL (ref 30.0–36.0)
MCV: 89.1 fL (ref 78.0–100.0)
MCV: 89.4 fL (ref 78.0–100.0)
PLATELETS: 230 10*3/uL (ref 150–400)
PLATELETS: 231 10*3/uL (ref 150–400)
RBC: 3.02 MIL/uL — ABNORMAL LOW (ref 3.87–5.11)
RBC: 3.39 MIL/uL — AB (ref 3.87–5.11)
RDW: 11.9 % (ref 11.5–15.5)
RDW: 12 % (ref 11.5–15.5)
WBC: 8.3 10*3/uL (ref 4.0–10.5)
WBC: 11.8 10*3/uL — ABNORMAL HIGH (ref 4.0–10.5)

## 2014-11-26 LAB — COMPREHENSIVE METABOLIC PANEL
ALBUMIN: 2.8 g/dL — AB (ref 3.5–5.0)
ALK PHOS: 66 U/L (ref 38–126)
ALT: 15 U/L (ref 14–54)
AST: 19 U/L (ref 15–41)
Anion gap: 8 (ref 5–15)
BILIRUBIN TOTAL: 0.8 mg/dL (ref 0.3–1.2)
BUN: 21 mg/dL — AB (ref 6–20)
CHLORIDE: 103 mmol/L (ref 101–111)
CO2: 23 mmol/L (ref 22–32)
Calcium: 7.7 mg/dL — ABNORMAL LOW (ref 8.9–10.3)
Creatinine, Ser: 1.11 mg/dL — ABNORMAL HIGH (ref 0.44–1.00)
GFR calc non Af Amer: 42 mL/min — ABNORMAL LOW (ref >60)
GFR, EST AFRICAN AMERICAN: 49 mL/min — AB (ref >60)
Glucose, Bld: 111 mg/dL — ABNORMAL HIGH (ref 65–99)
Potassium: 3.4 mmol/L — ABNORMAL LOW (ref 3.5–5.1)
Sodium: 134 mmol/L — ABNORMAL LOW (ref 135–145)
TOTAL PROTEIN: 4.8 g/dL — AB (ref 6.5–8.1)

## 2014-11-26 LAB — GLUCOSE, CAPILLARY: Glucose-Capillary: 118 mg/dL — ABNORMAL HIGH (ref 65–99)

## 2014-11-26 LAB — LIPID PANEL
Cholesterol: 155 mg/dL (ref 0–200)
HDL: 33 mg/dL — ABNORMAL LOW (ref >40)
LDL CALC: 102 mg/dL — AB (ref 0–99)
TRIGLYCERIDES: 102 mg/dL (ref <150)
Total CHOL/HDL Ratio: 4.7 RATIO
VLDL: 20 mg/dL (ref 0–40)

## 2014-11-26 LAB — ABO/RH: ABO/RH(D): O NEG

## 2014-11-26 LAB — SURGICAL PCR SCREEN
MRSA, PCR: NEGATIVE
STAPHYLOCOCCUS AUREUS: NEGATIVE

## 2014-11-26 LAB — PREPARE RBC (CROSSMATCH)

## 2014-11-26 SURGERY — OPEN REDUCTION INTERNAL FIXATION (ORIF) HUMERAL SHAFT FRACTURE
Anesthesia: Regional | Site: Arm Upper | Laterality: Right

## 2014-11-26 MED ORDER — ROPIVACAINE HCL 5 MG/ML IJ SOLN
INTRAMUSCULAR | Status: DC | PRN
  Administered 2014-11-26: 30 mL via PERINEURAL

## 2014-11-26 MED ORDER — PROPOFOL 10 MG/ML IV BOLUS
INTRAVENOUS | Status: DC | PRN
  Administered 2014-11-26: 100 mg via INTRAVENOUS

## 2014-11-26 MED ORDER — CEFAZOLIN SODIUM-DEXTROSE 2-3 GM-% IV SOLR
2.0000 g | Freq: Three times a day (TID) | INTRAVENOUS | Status: AC
  Administered 2014-11-26 – 2014-11-27 (×3): 2 g via INTRAVENOUS
  Filled 2014-11-26 (×3): qty 50

## 2014-11-26 MED ORDER — LIDOCAINE HCL (CARDIAC) 20 MG/ML IV SOLN
INTRAVENOUS | Status: AC
  Filled 2014-11-26: qty 10

## 2014-11-26 MED ORDER — ROCURONIUM BROMIDE 100 MG/10ML IV SOLN
INTRAVENOUS | Status: DC | PRN
  Administered 2014-11-26 (×3): 10 mg via INTRAVENOUS

## 2014-11-26 MED ORDER — ONDANSETRON HCL 4 MG/2ML IJ SOLN
INTRAMUSCULAR | Status: AC
  Filled 2014-11-26: qty 2

## 2014-11-26 MED ORDER — ACETAMINOPHEN 10 MG/ML IV SOLN
1000.0000 mg | Freq: Four times a day (QID) | INTRAVENOUS | Status: DC
  Administered 2014-11-26 – 2014-11-27 (×2): 1000 mg via INTRAVENOUS
  Filled 2014-11-26 (×2): qty 100

## 2014-11-26 MED ORDER — 0.9 % SODIUM CHLORIDE (POUR BTL) OPTIME
TOPICAL | Status: DC | PRN
  Administered 2014-11-26: 1000 mL

## 2014-11-26 MED ORDER — DEXTROSE 5 % IV SOLN
10.0000 mg | INTRAVENOUS | Status: DC | PRN
  Administered 2014-11-26: 50 ug/min via INTRAVENOUS

## 2014-11-26 MED ORDER — PROPOFOL 10 MG/ML IV BOLUS
INTRAVENOUS | Status: AC
  Filled 2014-11-26: qty 20

## 2014-11-26 MED ORDER — FENTANYL CITRATE (PF) 100 MCG/2ML IJ SOLN
50.0000 ug | Freq: Once | INTRAMUSCULAR | Status: AC
  Administered 2014-11-26: 50 ug via INTRAVENOUS

## 2014-11-26 MED ORDER — LACTATED RINGERS IV SOLN
INTRAVENOUS | Status: DC | PRN
  Administered 2014-11-26: 12:00:00 via INTRAVENOUS

## 2014-11-26 MED ORDER — ARTIFICIAL TEARS OP OINT
TOPICAL_OINTMENT | OPHTHALMIC | Status: AC
  Filled 2014-11-26: qty 3.5

## 2014-11-26 MED ORDER — SUCCINYLCHOLINE CHLORIDE 20 MG/ML IJ SOLN
INTRAMUSCULAR | Status: DC | PRN
  Administered 2014-11-26: 80 mg via INTRAVENOUS

## 2014-11-26 MED ORDER — DEXAMETHASONE SODIUM PHOSPHATE 4 MG/ML IJ SOLN
INTRAMUSCULAR | Status: AC
  Filled 2014-11-26: qty 1

## 2014-11-26 MED ORDER — DEXAMETHASONE SODIUM PHOSPHATE 4 MG/ML IJ SOLN
INTRAMUSCULAR | Status: DC | PRN
  Administered 2014-11-26: 4 mg via INTRAVENOUS

## 2014-11-26 MED ORDER — FENTANYL CITRATE (PF) 100 MCG/2ML IJ SOLN
INTRAMUSCULAR | Status: AC
  Administered 2014-11-26: 50 ug via INTRAVENOUS
  Filled 2014-11-26: qty 2

## 2014-11-26 MED ORDER — SODIUM CHLORIDE 0.9 % IJ SOLN
INTRAMUSCULAR | Status: AC
  Filled 2014-11-26: qty 10

## 2014-11-26 MED ORDER — HYDRALAZINE HCL 20 MG/ML IJ SOLN
INTRAMUSCULAR | Status: AC
  Filled 2014-11-26: qty 1

## 2014-11-26 MED ORDER — LIDOCAINE HCL (CARDIAC) 20 MG/ML IV SOLN
INTRAVENOUS | Status: DC | PRN
  Administered 2014-11-26: 20 mg via INTRAVENOUS

## 2014-11-26 MED ORDER — SUCCINYLCHOLINE CHLORIDE 20 MG/ML IJ SOLN
INTRAMUSCULAR | Status: AC
  Filled 2014-11-26: qty 1

## 2014-11-26 MED ORDER — LABETALOL HCL 5 MG/ML IV SOLN
INTRAVENOUS | Status: AC
  Filled 2014-11-26: qty 4

## 2014-11-26 MED ORDER — SUGAMMADEX SODIUM 200 MG/2ML IV SOLN
INTRAVENOUS | Status: DC | PRN
  Administered 2014-11-26: 100 mg via INTRAVENOUS

## 2014-11-26 MED ORDER — SUGAMMADEX SODIUM 200 MG/2ML IV SOLN
INTRAVENOUS | Status: AC
  Filled 2014-11-26: qty 2

## 2014-11-26 MED ORDER — FENTANYL CITRATE (PF) 250 MCG/5ML IJ SOLN
INTRAMUSCULAR | Status: AC
  Filled 2014-11-26: qty 5

## 2014-11-26 MED ORDER — ROCURONIUM BROMIDE 50 MG/5ML IV SOLN
INTRAVENOUS | Status: AC
  Filled 2014-11-26: qty 1

## 2014-11-26 MED ORDER — ONDANSETRON HCL 4 MG/2ML IJ SOLN
INTRAMUSCULAR | Status: DC | PRN
  Administered 2014-11-26 (×2): 4 mg via INTRAVENOUS

## 2014-11-26 MED ORDER — FENTANYL CITRATE (PF) 100 MCG/2ML IJ SOLN: 25.0000 ug | INTRAMUSCULAR | Status: DC | PRN

## 2014-11-26 MED ORDER — CHLORHEXIDINE GLUCONATE CLOTH 2 % EX PADS: 6.0000 | MEDICATED_PAD | Freq: Every day | CUTANEOUS | Status: DC

## 2014-11-26 MED ORDER — LABETALOL HCL 5 MG/ML IV SOLN
5.0000 mg | Freq: Once | INTRAVENOUS | Status: AC
  Administered 2014-11-26: 5 mg via INTRAVENOUS

## 2014-11-26 SURGICAL SUPPLY — 91 items
BANDAGE ELASTIC 3 VELCRO ST LF (GAUZE/BANDAGES/DRESSINGS) ×2 IMPLANT
BENZOIN TINCTURE PRP APPL 2/3 (GAUZE/BANDAGES/DRESSINGS) IMPLANT
BIT DRILL 100X2XQC STRL (BIT) ×1 IMPLANT
BIT DRILL 2.5X110 QC LCP DISP (BIT) ×2 IMPLANT
BIT DRILL 2.8 (BIT) ×1
BIT DRILL CANN QC 2.8X165 (BIT) ×1 IMPLANT
BIT DRILL PERC QC 2.8X200 100 (BIT) ×1 IMPLANT
BIT DRILL QC 2.0X100 (BIT) ×1
BIT DRILL QC 3.5X110 (BIT) ×2 IMPLANT
BIT DRL 100X2XQC STRL (BIT) ×1
BNDG ESMARK 4X9 LF (GAUZE/BANDAGES/DRESSINGS) IMPLANT
BNDG GAUZE ELAST 4 BULKY (GAUZE/BANDAGES/DRESSINGS) IMPLANT
BONE CANC CHIPS 20CC PCAN1/4 (Bone Implant) ×2 IMPLANT
BRUSH SCRUB DISP (MISCELLANEOUS) ×2 IMPLANT
CHIPS CANC BONE 20CC PCAN1/4 (Bone Implant) ×1 IMPLANT
CORDS BIPOLAR (ELECTRODE) IMPLANT
COVER SURGICAL LIGHT HANDLE (MISCELLANEOUS) ×4 IMPLANT
DRAPE C-ARM 42X72 X-RAY (DRAPES) ×2 IMPLANT
DRAPE C-ARMOR (DRAPES) ×2 IMPLANT
DRAPE IMP U-DRAPE 54X76 (DRAPES) ×4 IMPLANT
DRAPE INCISE 13X13 STRL (DRAPES) ×2 IMPLANT
DRAPE ORTHO SPLIT 77X108 STRL (DRAPES) ×1
DRAPE SURG 17X11 SM STRL (DRAPES) IMPLANT
DRAPE SURG ORHT 6 SPLT 77X108 (DRAPES) ×1 IMPLANT
DRAPE U-SHAPE 47X51 STRL (DRAPES) IMPLANT
DRILL BIT 2.8MM (BIT) ×1
DRILL BIT QUICK COUP 2.8MM 100 (BIT) ×1
DRSG ADAPTIC 3X8 NADH LF (GAUZE/BANDAGES/DRESSINGS) IMPLANT
DRSG MEPILEX BORDER 4X12 (GAUZE/BANDAGES/DRESSINGS) ×2 IMPLANT
DRSG PAD ABDOMINAL 8X10 ST (GAUZE/BANDAGES/DRESSINGS) IMPLANT
ELECT REM PT RETURN 9FT ADLT (ELECTROSURGICAL) ×2
ELECTRODE REM PT RTRN 9FT ADLT (ELECTROSURGICAL) ×1 IMPLANT
EVACUATOR 1/8 PVC DRAIN (DRAIN) IMPLANT
GAUZE SPONGE 4X4 12PLY STRL (GAUZE/BANDAGES/DRESSINGS) IMPLANT
GLOVE BIO SURGEON STRL SZ7.5 (GLOVE) ×2 IMPLANT
GLOVE BIO SURGEON STRL SZ8 (GLOVE) ×2 IMPLANT
GLOVE BIOGEL PI IND STRL 7.5 (GLOVE) ×1 IMPLANT
GLOVE BIOGEL PI IND STRL 8 (GLOVE) ×1 IMPLANT
GLOVE BIOGEL PI INDICATOR 7.5 (GLOVE) ×1
GLOVE BIOGEL PI INDICATOR 8 (GLOVE) ×1
GOWN STRL REUS W/ TWL LRG LVL3 (GOWN DISPOSABLE) ×2 IMPLANT
GOWN STRL REUS W/ TWL XL LVL3 (GOWN DISPOSABLE) ×1 IMPLANT
GOWN STRL REUS W/TWL LRG LVL3 (GOWN DISPOSABLE) ×2
GOWN STRL REUS W/TWL XL LVL3 (GOWN DISPOSABLE) ×1
KIT BASIN OR (CUSTOM PROCEDURE TRAY) ×2 IMPLANT
KIT ROOM TURNOVER OR (KITS) ×2 IMPLANT
MANIFOLD NEPTUNE II (INSTRUMENTS) ×2 IMPLANT
NEEDLE HYPO 25X1 1.5 SAFETY (NEEDLE) IMPLANT
NS IRRIG 1000ML POUR BTL (IV SOLUTION) ×2 IMPLANT
PACK ORTHO EXTREMITY (CUSTOM PROCEDURE TRAY) ×2 IMPLANT
PACK UNIVERSAL I (CUSTOM PROCEDURE TRAY) ×2 IMPLANT
PAD ARMBOARD 7.5X6 YLW CONV (MISCELLANEOUS) ×4 IMPLANT
PROS LCP PLATE 12 163M (Plate) ×2 IMPLANT
PROSTHESIS LCP PLATE 12 163M (Plate) ×1 IMPLANT
SCREW CORTEX 2.7 22M (Screw) ×2 IMPLANT
SCREW CORTEX 2.7 24M (Screw) ×2 IMPLANT
SCREW CORTEX 3.5 20MM (Screw) ×1 IMPLANT
SCREW CORTEX 3.5 22MM (Screw) ×1 IMPLANT
SCREW CORTEX 3.5 24MM (Screw) ×1 IMPLANT
SCREW CORTEX 3.5 26MM (Screw) ×1 IMPLANT
SCREW CORTEX 3.5 28MM (Screw) ×1 IMPLANT
SCREW LOCK CORT ST 3.5X20 (Screw) ×1 IMPLANT
SCREW LOCK CORT ST 3.5X22 (Screw) ×1 IMPLANT
SCREW LOCK CORT ST 3.5X24 (Screw) ×1 IMPLANT
SCREW LOCK CORT ST 3.5X26 (Screw) ×1 IMPLANT
SCREW LOCK CORT ST 3.5X28 (Screw) ×1 IMPLANT
SCREW LOCK T15 FT 18X3.5X2.9X (Screw) ×1 IMPLANT
SCREW LOCK T15 FT 22X3.5XST (Screw) ×1 IMPLANT
SCREW LOCK T15 FT 24X3.5X2.9X (Screw) ×4 IMPLANT
SCREW LOCKING 3.5X18 (Screw) ×1 IMPLANT
SCREW LOCKING 3.5X22 (Screw) ×1 IMPLANT
SCREW LOCKING 3.5X24 (Screw) ×4 IMPLANT
SPONGE LAP 18X18 X RAY DECT (DISPOSABLE) ×4 IMPLANT
STAPLER VISISTAT 35W (STAPLE) ×2 IMPLANT
STOCKINETTE IMPERVIOUS 9X36 MD (GAUZE/BANDAGES/DRESSINGS) ×2 IMPLANT
SUCTION FRAZIER TIP 10 FR DISP (SUCTIONS) ×2 IMPLANT
SUT ETHILON 2 0 FS 18 (SUTURE) ×6 IMPLANT
SUT ETHILON 3 0 PS 1 (SUTURE) ×4 IMPLANT
SUT PDS AB 2-0 CT1 27 (SUTURE) IMPLANT
SUT VIC AB 0 CT1 27 (SUTURE) ×2
SUT VIC AB 0 CT1 27XBRD ANBCTR (SUTURE) ×2 IMPLANT
SUT VIC AB 2-0 CT1 27 (SUTURE) ×2
SUT VIC AB 2-0 CT1 TAPERPNT 27 (SUTURE) ×2 IMPLANT
SYR 5ML LL (SYRINGE) IMPLANT
SYR CONTROL 10ML LL (SYRINGE) IMPLANT
TOWEL OR 17X24 6PK STRL BLUE (TOWEL DISPOSABLE) ×2 IMPLANT
TOWEL OR 17X26 10 PK STRL BLUE (TOWEL DISPOSABLE) ×4 IMPLANT
TRAY FOLEY CATH 16FRSI W/METER (SET/KITS/TRAYS/PACK) IMPLANT
TUBE CONNECTING 12X1/4 (SUCTIONS) ×2 IMPLANT
WATER STERILE IRR 1000ML POUR (IV SOLUTION) IMPLANT
YANKAUER SUCT BULB TIP NO VENT (SUCTIONS) ×2 IMPLANT

## 2014-11-26 NOTE — Anesthesia Procedure Notes (Addendum)
Anesthesia Regional Block:  Interscalene brachial plexus block  Pre-Anesthetic Checklist: ,, timeout performed, Correct Patient, Correct Site, Correct Laterality, Correct Procedure, Correct Position, site marked, Risks and benefits discussed, pre-op evaluation,  At surgeon's request and post-op pain management  Laterality: Right  Prep: Maximum Sterile Barrier Precautions used and chloraprep       Needles:  Injection technique: Single-shot  Needle Type: Echogenic Stimulator Needle     Needle Length: 5cm 5 cm Needle Gauge: 22 and 22 G    Additional Needles:  Procedures: ultrasound guided (picture in chart) and nerve stimulator Interscalene brachial plexus block  Nerve Stimulator or Paresthesia:  Response: Biceps response,   Additional Responses:   Narrative:  Start time: 11/26/2014 12:12 PM End time: 11/26/2014 12:21 PM Injection made incrementally with aspirations every 5 mL. Anesthesiologist: Roderic Palau  Additional Notes: 2% Lidocaine skin wheel.    Procedure Name: Intubation Date/Time: 11/26/2014 12:42 PM Performed by: Susa Loffler Pre-anesthesia Checklist: Patient identified, Emergency Drugs available, Suction available, Patient being monitored and Timeout performed Patient Re-evaluated:Patient Re-evaluated prior to inductionOxygen Delivery Method: Circle system utilized Preoxygenation: Pre-oxygenation with 100% oxygen Intubation Type: IV induction, Cricoid Pressure applied and Rapid sequence Laryngoscope Size: Miller and 2 Grade View: Grade II Tube type: Oral Tube size: 7.0 mm Number of attempts: 1 Airway Equipment and Method: Stylet Placement Confirmation: ETT inserted through vocal cords under direct vision,  positive ETCO2 and breath sounds checked- equal and bilateral Secured at: 20 cm Tube secured with: Tape Dental Injury: Teeth and Oropharynx as per pre-operative assessment  Comments: Atraumatic intubation by Sherley Bounds, SRNA

## 2014-11-26 NOTE — Anesthesia Preprocedure Evaluation (Addendum)
Anesthesia Evaluation  Patient identified by MRN, date of birth, ID band Patient awake    Reviewed: Allergy & Precautions, H&P , NPO status , Patient's Chart, lab work & pertinent test results, reviewed documented beta blocker date and time   Airway Mallampati: II  TM Distance: >3 FB Neck ROM: Full    Dental no notable dental hx. (+) Upper Dentures, Partial Lower, Dental Advisory Given   Pulmonary neg pulmonary ROS, former smoker,  breath sounds clear to auscultation  Pulmonary exam normal       Cardiovascular hypertension, Pt. on medications and Pt. on home beta blockers Rhythm:Regular Rate:Normal     Neuro/Psych negative neurological ROS  negative psych ROS   GI/Hepatic Neg liver ROS, GERD-  Medicated and Controlled,  Endo/Other  negative endocrine ROSHyperthyroidism   Renal/GU Renal InsufficiencyRenal disease  negative genitourinary   Musculoskeletal   Abdominal   Peds  Hematology negative hematology ROS (+)   Anesthesia Other Findings   Reproductive/Obstetrics negative OB ROS                            Anesthesia Physical Anesthesia Plan  ASA: III  Anesthesia Plan: General and Regional   Post-op Pain Management:    Induction: Intravenous  Airway Management Planned: Oral ETT  Additional Equipment:   Intra-op Plan:   Post-operative Plan: Extubation in OR  Informed Consent: I have reviewed the patients History and Physical, chart, labs and discussed the procedure including the risks, benefits and alternatives for the proposed anesthesia with the patient or authorized representative who has indicated his/her understanding and acceptance.   Dental advisory given  Plan Discussed with: CRNA  Anesthesia Plan Comments:         Anesthesia Quick Evaluation

## 2014-11-26 NOTE — Anesthesia Postprocedure Evaluation (Signed)
Anesthesia Post Note  Patient: Stephanie Martinez  Procedure(s) Performed: Procedure(s) (LRB): OPEN REDUCTION INTERNAL FIXATION (ORIF) HUMERAL SHAFT FRACTURE (Right)  Anesthesia type: general  Patient location: PACU  Post pain: Pain level controlled  Post assessment: Patient's Cardiovascular Status Stable  Last Vitals:  Filed Vitals:   11/26/14 1615  BP:   Pulse: 66  Temp:   Resp: 14    Post vital signs: Reviewed and stable  Level of consciousness: sedated  Complications: No apparent anesthesia complications

## 2014-11-26 NOTE — Progress Notes (Signed)
PT Cancellation Note  Patient Details Name: Stephanie Martinez MRN: [id number] DOB: 1922/02/13   Cancelled Treatment:    Reason Eval/Treat Not Completed: Other (comment) (Per pt's notes and RN, pt scheduled for surgery today).  PT will continue to follow acutely.  Thank you for this order.  Joslyn Hy PT, DPT [phone] Pager: [phone] 11/26/2014, 9:06 AM

## 2014-11-26 NOTE — Brief Op Note (Signed)
11/25/2014 - 11/26/2014  3:30 PM  PATIENT:  Stephanie Martinez  81 y.o. female  PRE-OPERATIVE DIAGNOSIS:  Right Distal Humeral Shaft Fracture  POST-OPERATIVE DIAGNOSIS:  Right Distal Humeral Shaft Fracture  PROCEDURE:  Procedure(s): OPEN REDUCTION INTERNAL FIXATION (ORIF) HUMERAL SHAFT FRACTURE (Right)  SURGEON:  Surgeon(s) and Role:    * Altamese [place], MD - Primary  PHYSICIAN ASSISTANT: Ainsley Spinner, PA-C  ANESTHESIA:   general  I/O:  Total I/O In: 800 [I.V.:800] Out: 150 [Blood:150]  SPECIMEN:  No Specimen  TOURNIQUET:  * No tourniquets in log *  DICTATION: .Other Dictation: Dictation Number [id number]

## 2014-11-26 NOTE — Progress Notes (Signed)
[facility] Progress Note   Stephanie Martinez UUV:[id number] DOB: 1922/06/13 DOA: 11/25/2014 PCP: Scarlette Calico, MD  Brief narrative: Stephanie Martinez is a 81 y.o. female with HTN, HLP, colon and breast cancer s/p treatment, CKD 3 who presents after a mechanical fall and is found to have a right humeral shaft fracture.   Subjective: Pain in right arm. Mild nausea. No other complaints.   Assessment/Plan: Principal Problem:   Humerus distal fracture - will go to OR later today - Morphine and Oxycodone for pain control  Active Problems:  Anemia- due to acute blood loss? - Hb 9.5 today- baseline 12- 13- repeat Hb later today and cont to follow closely     Essential hypertension, benign - cont Coreg, Losartan - HCTZ on hold  Hypokalemia - replace and follow tomorrow  Dehydration/ mild hyponatremia  - cont IVF- holding HCTZ    GERD (gastroesophageal reflux disease) - cont PPI    CKD (chronic kidney disease), stage III - stable - follow    Code Status: DNR Family Communication:  Disposition Plan: surgery later today DVT prophylaxis: SCDs- may be bleeding from fracture- on ASA as well Consultants:ortho Procedures:  Antibiotics: Anti-infectives    Start     Dose/Rate Route Frequency Ordered Stop   11/26/14 1000  ceFAZolin (ANCEF) IVPB 2 g/50 mL premix     2 g 100 mL/hr over 30 Minutes Intravenous To [facility] 11/25/14 2129 11/27/14 1000      Objective: There were no vitals filed for this visit. No intake or output data in the 24 hours ending 11/26/14 0824   Vitals Filed Vitals:   11/25/14 2033 11/25/14 2058 11/25/14 2302 11/26/14 0627  BP: 184/88 179/91 166/85 162/85  Pulse: 79 76 87 74  Temp:  97.7 F (36.5 C) 97.6 F (36.4 C) 97.5 F (36.4 C)  TempSrc:  Oral    Resp:  16 16 16   SpO2: 99% 96% 98% 97%    Exam:  General:  Pt is alert, not in acute distress  HEENT: No icterus, No thrush, oral mucosa moist  Cardiovascular: regular  rate and rhythm, S1/S2 No murmur  Respiratory: clear to auscultation bilaterally   Abdomen: Soft, +Bowel sounds, non tender, non distended, no guarding  MSK: No LE edema, cyanosis or clubbing- right arm in splint  Data Reviewed: Basic Metabolic Panel:  Recent Labs Lab 11/25/14 1655 11/26/14 0620  NA 133* 134*  K 4.2 3.4*  CL 99* 103  CO2 23 23  GLUCOSE 136* 111*  BUN 27* 21*  CREATININE 1.34* 1.11*  CALCIUM 9.7 7.7*   Liver Function Tests:  Recent Labs Lab 11/26/14 0620  AST 19  ALT 15  ALKPHOS 66  BILITOT 0.8  PROT 4.8*  ALBUMIN 2.8*   No results for input(s): LIPASE, AMYLASE in the last 168 hours. No results for input(s): AMMONIA in the last 168 hours. CBC:  Recent Labs Lab 11/25/14 1801 11/26/14 0620  WBC 14.7* 8.3  NEUTROABS 13.2*  --   HGB 11.6* 9.5*  HCT 32.8* 26.9*  MCV 88.6 89.1  PLT 265 230   Cardiac Enzymes: No results for input(s): CKTOTAL, CKMB, CKMBINDEX, TROPONINI in the last 168 hours. BNP (last 3 results) No results for input(s): BNP in the last 8760 hours.  ProBNP (last 3 results) No results for input(s): PROBNP in the last 8760 hours.  CBG:  Recent Labs Lab 11/26/14 0623  GLUCAP 118*    No results found for this or any previous visit (from  the past 240 hour(s)).   Studies: Dg Chest 2 View  11/25/2014   CLINICAL DATA:  Patient fell today. Tenderness over the RIGHT lower chest. Distal humerus fracture.  EXAM: CHEST  2 VIEW  COMPARISON:  11/14/2014.  FINDINGS: Cardiomegaly. Aortic atherosclerosis. Bibasilar scarring or atelectasis. No overt failure or infiltrates. The bones are demineralized. I do not see a definite rib fracture. There is no pneumothorax.  IMPRESSION: Stable exam. Cardiomegaly with chronic changes. No definite rib fracture or RIGHT pleural effusion.   Electronically Signed   By: Rolla Flatten M.D.   On: 11/25/2014 21:08   Ct Head Wo Contrast  11/25/2014   CLINICAL DATA:  Fall, RIGHT arm pain and deformity, struck  head, no loss of consciousness, history hypertension, breast cancer, colon cancer  EXAM: CT HEAD WITHOUT CONTRAST  CT CERVICAL SPINE WITHOUT CONTRAST  TECHNIQUE: Multidetector CT imaging of the head and cervical spine was performed following the standard protocol without intravenous contrast. Multiplanar CT image reconstructions of the cervical spine were also generated.  COMPARISON:  CT head 02/15/2012  FINDINGS: CT HEAD FINDINGS  Generalized atrophy.  Normal ventricular morphology.  No midline shift or mass effect.  Small vessel chronic ischemic changes of deep cerebral white matter.  No intracranial hemorrhage, mass lesion, or acute infarction.  Visualized paranasal sinuses and mastoid air cells clear.  Bones demineralized.  Small RIGHT frontal scalp hematoma.  Atherosclerotic calcifications at carotid siphons.  CT CERVICAL SPINE FINDINGS  Bones demineralized.  Prevertebral soft tissues normal thickness.  Vertebral body heights maintained.  Disc space narrowing C5-C6 and C6-C7 with minimal retrolisthesis at C5-C6.  Multilevel facet degenerative changes.  Visualized skullbase intact.  No acute fracture, additional subluxation or bone destruction.  Bone island C3 vertebral body.  Lung apices clear.  Tiny nonspecific RIGHT thyroid nodules.  IMPRESSION: No acute intracranial abnormalities.  Atrophy with mild small vessel chronic ischemic changes of deep cerebral white matter.  Osseous demineralization with degenerative disc and facet disease changes cervical spine and mild retrolisthesis at C5-C6.  No acute cervical spine abnormalities.   Electronically Signed   By: Lavonia Dana M.D.   On: 11/25/2014 17:24   Ct Cervical Spine Wo Contrast  11/25/2014   CLINICAL DATA:  Fall, RIGHT arm pain and deformity, struck head, no loss of consciousness, history hypertension, breast cancer, colon cancer  EXAM: CT HEAD WITHOUT CONTRAST  CT CERVICAL SPINE WITHOUT CONTRAST  TECHNIQUE: Multidetector CT imaging of the head and  cervical spine was performed following the standard protocol without intravenous contrast. Multiplanar CT image reconstructions of the cervical spine were also generated.  COMPARISON:  CT head 02/15/2012  FINDINGS: CT HEAD FINDINGS  Generalized atrophy.  Normal ventricular morphology.  No midline shift or mass effect.  Small vessel chronic ischemic changes of deep cerebral white matter.  No intracranial hemorrhage, mass lesion, or acute infarction.  Visualized paranasal sinuses and mastoid air cells clear.  Bones demineralized.  Small RIGHT frontal scalp hematoma.  Atherosclerotic calcifications at carotid siphons.  CT CERVICAL SPINE FINDINGS  Bones demineralized.  Prevertebral soft tissues normal thickness.  Vertebral body heights maintained.  Disc space narrowing C5-C6 and C6-C7 with minimal retrolisthesis at C5-C6.  Multilevel facet degenerative changes.  Visualized skullbase intact.  No acute fracture, additional subluxation or bone destruction.  Bone island C3 vertebral body.  Lung apices clear.  Tiny nonspecific RIGHT thyroid nodules.  IMPRESSION: No acute intracranial abnormalities.  Atrophy with mild small vessel chronic ischemic changes of deep cerebral white matter.  Osseous demineralization with degenerative disc and facet disease changes cervical spine and mild retrolisthesis at C5-C6.  No acute cervical spine abnormalities.   Electronically Signed   By: Lavonia Dana M.D.   On: 11/25/2014 17:24   Ct Humerus Right Wo Contrast  11/26/2014   CLINICAL DATA:  Further characterization of right distal humerus fracture after fall earlier this day.  EXAM: CT OF THE RIGHT HUMERUS WITHOUT CONTRAST  TECHNIQUE: Multidetector CT imaging was performed according to the standard protocol. Multiplanar CT image reconstructions were also generated.  COMPARISON:  Radiographs earlier this day.  FINDINGS: Fracture of the mid distal humeral diaphysis with 1 cm displacement, mild comminution, and minimal osseous overriding.  There is no distal extension to the elbow joint. The proximal humerus is intact. The elbow joint is maintained. No evidence of underlying lesion. There is diffuse soft tissue edema about the upper arm at the fracture site. There is no elbow joint effusion.  IMPRESSION: Fracture of the mid distal humeral diaphysis with displacement, mild comminution and minimal osseous overriding. No extension to the elbow joint.   Electronically Signed   By: Jeb Levering M.D.   On: 11/26/2014 00:55   Dg Humerus Right  11/25/2014   CLINICAL DATA:  Fall with significant distal arm pain and deformity  EXAM: RIGHT HUMERUS - 2+ VIEW  COMPARISON:  None.  FINDINGS: There is an oblique comminuted fracture through the mid to distal right humerus. Some posterior and lateral displacement of the distal fracture fragment is noted. No other fractures are seen.  IMPRESSION: Fracture of the mid to distal right humerus   Electronically Signed   By: Inez Catalina M.D.   On: 11/25/2014 17:36    Scheduled Meds:  Scheduled Meds: . aspirin  81 mg Oral Daily  . calcium-vitamin D  1 tablet Oral Q breakfast  . carvedilol  6.25 mg Oral BID WC  .  ceFAZolin (ANCEF) IV  2 g Intravenous To SS-Surg  . Chlorhexidine Gluconate Cloth  6 each Topical Q0600  . dextromethorphan-guaiFENesin  1 tablet Oral BID  . latanoprost  1 drop Both Eyes QHS  . losartan  100 mg Oral Daily  . pantoprazole  40 mg Oral Daily  . vitamin C  500 mg Oral Daily   Continuous Infusions: . sodium chloride    . sodium chloride 75 mL/hr at 11/25/14 2256    Time spent on care of this patient: 52 min   [place], MD 11/26/2014, 8:24 AM  LOS: 1 day   [facility] Office  [phone] Pager - Text Page per [email or web address] If 7PM-7AM, please contact night-coverage [email or web address]

## 2014-11-26 NOTE — Progress Notes (Signed)
Occupational Therapy Evaluation Patient Details Name: Stephanie Martinez MRN: [id number] DOB: 01-01-1922 Today's Date: 11/26/2014    History of Present Illness s/p fall with resulting R humerus fx; surgery scheduled for today   Clinical Impression   PTA, pt lived at home with daughter and was mod I with ADL and mobility using tripod RW. Pt has a history of BPPV and has been treated successfully with the Epley maneuver in the past and is anxious that her vertigo will return s/p surgery. Also, pt's family member in household currently has Shingles. If pt expresses symptoms of BPPV, she will need a vestibular eval prior to D/C. Pt will need HHOT/PT after D/C and will need 24/7 assistance.will plan to see in am to complete family education. Will need clarification orders regarding ROM and WBS s/p surgery. Thanks.    Follow Up Recommendations  Home health OT;Supervision/Assistance - 24 hour    Equipment Recommendations  3 in 1 bedside comode    Recommendations for Other Services       Precautions / Restrictions Precautions Precautions: Fall Precaution Comments: will assess after surgery Restrictions Weight Bearing Restrictions: Yes RUE Weight Bearing: Non weight bearing      Mobility Bed Mobility Overal bed mobility: Needs Assistance Bed Mobility: Supine to Sit     Supine to sit: Mod assist        Transfers Overall transfer level: Needs assistance   Transfers: Sit to/from Stand;Stand Pivot Transfers Sit to Stand: Min assist Stand pivot transfers: Min assist            Balance Overall balance assessment: History of Falls                                          ADL Overall ADL's : Needs assistance/impaired Eating/Feeding: NPO                                   Functional mobility during ADLs: Minimal assistance General ADL Comments: Max A for all ADL at this time. Pt with fear of falling.     Vision     Perception      Praxis      Pertinent Vitals/Pain Pain Assessment: 0-10 Pain Score: 3  Pain Location: R shoulder Pain Descriptors / Indicators: Aching Pain Intervention(s): Limited activity within patient's tolerance     Hand Dominance Left   Extremity/Trunk Assessment Upper Extremity Assessment Upper Extremity Assessment: RUE deficits/detail RUE Deficits / Details: limited due to fx RUE: Unable to fully assess due to pain;Unable to fully assess due to immobilization   Lower Extremity Assessment Lower Extremity Assessment: Defer to PT evaluation   Cervical / Trunk Assessment Cervical / Trunk Assessment: Normal   Communication Communication Communication: HOH   Cognition Arousal/Alertness: Awake/alert Behavior During Therapy: WFL for tasks assessed/performed Overall Cognitive Status: Within Functional Limits for tasks assessed                     General Comments       Exercises Exercises:  (encouraged hand movement)     Shoulder Instructions      Home Living Family/patient expects to be discharged to:: Private residence Living Arrangements: Children;Other relatives Available Help at Discharge: Family;Available 24 hours/day Type of Home: House Home Access: Ramped entrance     Home Layout: Able  to live on main level with bedroom/bathroom     Bathroom Shower/Tub: Occupational psychologist: Handicapped height Bathroom Accessibility: Yes How Accessible: Accessible via wheelchair;Accessible via walker Home Equipment: [place] - 2 wheels;Cane - quad;Shower seat;Transport chair   Additional Comments: tripod walker      Prior Functioning/Environment Level of Independence: Independent with assistive device(s)        Comments: independent with bathing/dressing.daughter assisted with shower - supervised    OT Diagnosis: Generalized weakness;Acute pain   OT Problem List: Decreased strength;Decreased range of motion;Decreased activity tolerance;Impaired balance  (sitting and/or standing);Decreased knowledge of use of DME or AE;Decreased knowledge of precautions;Impaired UE functional use;Pain;Increased edema   OT Treatment/Interventions: Self-care/ADL training;Therapeutic exercise;Therapeutic activities;Patient/family education;DME and/or AE instruction    OT Goals(Current goals can be found in the care plan section) Acute Rehab OT Goals Patient Stated Goal: to get back to being independent OT Goal Formulation: With patient Time For Goal Achievement: 12/03/14 Potential to Achieve Goals: Good  OT Frequency: Min 2X/week   Barriers to D/C:            Co-evaluation              End of Session Nurse Communication: Mobility status;Precautions;Weight bearing status  Activity Tolerance: Patient tolerated treatment well Patient left: in chair;with call bell/phone within reach;with family/visitor present   Time: 7614-7092 OT Time Calculation (min): 35 min Charges:  OT General Charges $OT Visit: 1 Procedure OT Evaluation $Initial OT Evaluation Tier I: 1 Procedure OT Treatments $Self Care/Home Management : 8-22 mins G-Codes:    Breken Nazari,HILLARY December 07, 2014, 9:30 AM   Maurie Boettcher, OTR/L  [phone] 12-07-14

## 2014-11-26 NOTE — Transfer of Care (Signed)
Immediate Anesthesia Transfer of Care Note  Patient: Stephanie Martinez  Procedure(s) Performed: Procedure(s): OPEN REDUCTION INTERNAL FIXATION (ORIF) HUMERAL SHAFT FRACTURE (Right)  Patient Location: PACU  Anesthesia Type:General  Level of Consciousness: awake, alert  and oriented  Airway & Oxygen Therapy: Patient Spontanous Breathing and Patient connected to nasal cannula oxygen  Post-op Assessment: Report given to RN and Post -op Vital signs reviewed and stable  Post vital signs: Reviewed and stable  Last Vitals:  Filed Vitals:   11/26/14 1220  BP: 227/85  Pulse: 80  Temp:   Resp: 13    Complications: No apparent anesthesia complications

## 2014-11-26 NOTE — Consult Note (Signed)
[facility]opaedic Trauma Service consult note  Requesting: Carmin Muskrat, M.D., EDP Reason: Right distal third humeral shaft fracture   HPI  81 year old left-hand-dominant female sustained a ground-level fall while at home yesterday afternoon. Patient tripped and fell. She grabbed a bookcase with her right arm which twisted as she went to the ground. Patient hit her head and had immediate onset of pain to her right upper extremity. Patient was brought to [facility] where she was found to have a right distal third humeral shaft fracture. Patient transferred to [place] due to OR availability. Admitted to the medicine service given medical history. Patient seen this morning. Complains primarily of right arm pain but is much more comfortable.  Review of Systems  Constitutional: Negative for fever and chills.  HENT: Positive for hearing loss.   Respiratory: Negative for shortness of breath and wheezing.   Cardiovascular: Positive for chest pain (Right chest wall pain). Negative for palpitations.  Gastrointestinal: Negative for nausea, vomiting and abdominal pain.  Musculoskeletal:       Right arm pain  Neurological: Negative for tingling and sensory change.    Allergies  Iodine allergy  Past Medical History  Diagnosis Date  . Glaucoma   . Arthritis   . Hyperlipidemia   . Hypertension   . Meniere disease   . GERD (gastroesophageal reflux disease)   . Breast cancer     lumpectomy and radiation  . Vertigo     doing PT   . Colon cancer     resection  . Overactive thyroid gland     treated with liquid iodine  . Osteoporosis   . CKD (chronic kidney disease), stage III    Past Surgical History  Procedure Laterality Date  . Breast surgery      lumpectomy-left  . Appendectomy    . Spine surgery      x2  . Small intestine surgery      resection  . Total hip arthroplasty      bilateral one 20 years ago, other 8-10 years ago  . Tonsillectomy and adenoidectomy   before age 59   Family History  Problem Relation Age of Onset  . Stroke Mother   . Colon cancer Father     dx in 43s  . Lung cancer Sister 28  . Heart disease Neg Hx   . Hyperlipidemia Neg Hx   . Hypertension Other     materal side  . Breast cancer Paternal Aunt     <50  . BRCA 1/2 Daughter     BRCA2 positive  . BRCA 1/2 Daughter     BRCA2 positive  . Breast cancer Sister 23    bilateral breast cancer, 2nd dx at 1  . Colon cancer Sister 74  . Breast cancer Sister 39    bilateral breast cancer; 2nd dx age 49  . Leukemia Other     dx in his 44s, youngest sister's son  . Thyroid disease Daughter    Medications Prior to Admission  Medication Sig Dispense Refill  . aspirin 81 MG tablet Take 81 mg by mouth daily.    . calcium citrate-vitamin D (CITRACAL+D) 315-200 MG-UNIT per tablet Take 1 tablet by mouth daily.    . carvedilol (COREG) 6.25 MG tablet Take 1 tablet (6.25 mg total) by mouth 2 (two) times daily with a meal. 60 tablet 5  . GLUCOSAMINE HCL PO Take 1,000 mg by mouth daily.    Marland Kitchen latanoprost (XALATAN) 0.005 % ophthalmic solution Place  1 drop into both eyes at bedtime.     Marland Kitchen losartan-hydrochlorothiazide (HYZAAR) 100-12.5 MG per tablet Take 1 tablet by mouth daily. 90 tablet 3  . LUTEIN PO Take 25 mg by mouth daily.    Marland Kitchen omeprazole (PRILOSEC) 20 MG capsule TAKE 1 CAPSULE BY MOUTH DAILY 90 capsule 3  . Umeclidinium Bromide (INCRUSE ELLIPTA) 62.5 MCG/INH AEPB Inhale 1 puff into the lungs daily. 30 each 11  . vitamin C (ASCORBIC ACID) 500 MG tablet Take 500 mg by mouth daily.     History   Social History  . Marital Status: Widowed    Spouse Name: N/A  . Number of Children: N/A  . Years of Education: N/A   Occupational History  . Not on file.   Social History Main Topics  . Smoking status: Former Research scientist (life sciences)  . Smokeless tobacco: Never Used     Comment: in her early 38s  . Alcohol Use: 2.5 - 3.0 oz/week    5-6 drink(s) per week  . Drug Use: No  . Sexual Activity: No    Other Topics Concern  . Not on file   Social History Narrative     Physical Exam   BP 162/85 mmHg  Pulse 74  Temp(Src) 97.5 F (36.4 C) (Oral)  Resp 16  SpO2 97%  Intake/Output    None     Labs  Results for Stephanie Martinez, Stephanie Martinez (MRN [id number]) as of 11/26/2014 08:34  Ref. Range 11/26/2014 06:20  Sodium Latest Ref Range: 135-145 mmol/L 134 (L)  Potassium Latest Ref Range: 3.5-5.1 mmol/L 3.4 (L)  Chloride Latest Ref Range: 101-111 mmol/L 103  CO2 Latest Ref Range: 22-32 mmol/L 23  BUN Latest Ref Range: 6-20 mg/dL 21 (H)  Creatinine Latest Ref Range: 0.44-1.00 mg/dL 1.11 (H)  Calcium Latest Ref Range: 8.9-10.3 mg/dL 7.7 (L)  EGFR (Non-African Amer.) Latest Ref Range: >60 mL/min 42 (L)  EGFR (African American) Latest Ref Range: >60 mL/min 49 (L)  Glucose Latest Ref Range: 65-99 mg/dL 111 (H)  Anion gap Latest Ref Range: 5-15  8  Alkaline Phosphatase Latest Ref Range: 38-126 U/L 66  Albumin Latest Ref Range: 3.5-5.0 g/dL 2.8 (L)  AST Latest Ref Range: 15-41 U/L 19  ALT Latest Ref Range: 14-54 U/L 15  Total Protein Latest Ref Range: 6.5-8.1 g/dL 4.8 (L)  Total Bilirubin Latest Ref Range: 0.3-1.2 mg/dL 0.8  Cholesterol Latest Ref Range: 0-200 mg/dL 155  Triglycerides Latest Ref Range: <150 mg/dL 102  HDL Cholesterol Latest Ref Range: >40 mg/dL 33 (L)  LDL (calc) Latest Ref Range: 0-99 mg/dL 102 (H)  VLDL Latest Ref Range: 0-40 mg/dL 20  Total CHOL/HDL Ratio Latest Units: RATIO 4.7  WBC Latest Ref Range: 4.0-10.5 K/uL 8.3  RBC Latest Ref Range: 3.87-5.11 MIL/uL 3.02 (L)  Hemoglobin Latest Ref Range: 12.0-15.0 g/dL 9.5 (L)  HCT Latest Ref Range: 36.0-46.0 % 26.9 (L)  MCV Latest Ref Range: 78.0-100.0 fL 89.1  MCH Latest Ref Range: 26.0-34.0 pg 31.5  MCHC Latest Ref Range: 30.0-36.0 g/dL 35.3  RDW Latest Ref Range: 11.5-15.5 % 11.9  Platelets Latest Ref Range: 150-400 K/uL 230    Exam  Gen: Awake alert, no acute distress, hard of hearing Lungs: Clear anterior  fields Cardiac: Regular rate and rhythm, S1 and S2 Abd:+ Bowel sounds, nontender, nondistended Pelvis: No instability or pain with bony manipulation Ext:      Right Upper Extremity  Inspection:    Right arm is splinted, posterior long-arm splint and sling  This was not removed     Shoulder, hand unremarkable Bony eval:     Hand and shoulder are nontender     Splint not removed to evaluate distal humerus, elbow and forearm Soft tissue:     Soft tissue of the hand and proximal arm is stable     Splint not removed to evaluate soft tissue about the elbow ROM:     Full active range of motion of fingers noted Sensation:     Radial, ulnar, median and accident nerve sensory functions intact grossly Motor:    Radial, ulnar, median, AIN and PI and motor functions intact Vascular:    Extremity is warm    Unable to ascertain radial pulse due to splint        Left upper extremity    UEx shoulder, elbow, wrist, digits- no skin wounds, nontender, no instability, no blocks to motion  Sens  Ax/R/M/U intact  Mot   Ax/ R/ PIN/ M/ AIN/ U intact  Rad 2+       Bilateral lower extremities  LLE No traumatic wounds, ecchymosis, or rash  Nontender  No effusions  Knee stable to varus/ valgus and anterior/posterior stress  No pain with axial loading or logrolling of her hips  Sens DPN, SPN, TN intact  Motor EHL, ext, flex, evers 5/5  DP 2+   Imaging  CT scan and x-ray of right humerus   Comminuted right distal third humeral shaft fracture   Assessment and Plan   POD/HD#: 57  81 year old left-hand-dominant female status post ground level fall with comminuted right distal third humeral shaft fracture  1. Comminuted right distal third humeral shaft fracture  Patient will require ORIF of her right humerus to restore alignment and stability.  OR this afternoon  PT and OT consults postoperatively  Conceivably could discharge tomorrow as long she remained stable on passes therapy  2. Pain  management:  Add IV Tylenol  Minimize narcotics  3. ABL anemia/Hemodynamics  Hemoglobin dropped about 2 points from yesterday  Continue to monitor  4. Medical issues   Per primary service  5. DVT/PE prophylaxis:  will not require pharmacologic DVT prophylaxis as this is an upper extremity injury and patient can be mobile but may consider aspirin or Lovenox given her cancer history  6. Metabolic Bone Disease:  Vitamin D levels pending  7. Activity:  Bedrest for now  Therapies postop  8. FEN/Foley/Lines:  Npo  9. Dispo:  OR this afternoon    Jari Pigg, PA-C [facility] [phone] (O) 11/26/2014 8:28 AM

## 2014-11-27 ENCOUNTER — Encounter (HOSPITAL_COMMUNITY): Payer: Self-pay | Admitting: Orthopedic Surgery

## 2014-11-27 ENCOUNTER — Inpatient Hospital Stay (HOSPITAL_COMMUNITY): Payer: Medicare Other

## 2014-11-27 DIAGNOSIS — I951 Orthostatic hypotension: Secondary | ICD-10-CM

## 2014-11-27 LAB — BASIC METABOLIC PANEL
Anion gap: 10 (ref 5–15)
BUN: 21 mg/dL — ABNORMAL HIGH (ref 6–20)
CALCIUM: 8.3 mg/dL — AB (ref 8.9–10.3)
CO2: 24 mmol/L (ref 22–32)
Chloride: 98 mmol/L — ABNORMAL LOW (ref 101–111)
Creatinine, Ser: 1.34 mg/dL — ABNORMAL HIGH (ref 0.44–1.00)
GFR calc non Af Amer: 34 mL/min — ABNORMAL LOW (ref >60)
GFR, EST AFRICAN AMERICAN: 39 mL/min — AB (ref >60)
Glucose, Bld: 86 mg/dL (ref 65–99)
POTASSIUM: 3.6 mmol/L (ref 3.5–5.1)
SODIUM: 132 mmol/L — AB (ref 135–145)

## 2014-11-27 LAB — CBC
HEMATOCRIT: 26.5 % — AB (ref 36.0–46.0)
Hemoglobin: 9.4 g/dL — ABNORMAL LOW (ref 12.0–15.0)
MCH: 31.4 pg (ref 26.0–34.0)
MCHC: 35.5 g/dL (ref 30.0–36.0)
MCV: 88.6 fL (ref 78.0–100.0)
Platelets: 223 10*3/uL (ref 150–400)
RBC: 2.99 MIL/uL — ABNORMAL LOW (ref 3.87–5.11)
RDW: 12.1 % (ref 11.5–15.5)
WBC: 10.1 10*3/uL (ref 4.0–10.5)

## 2014-11-27 LAB — GLUCOSE, CAPILLARY: Glucose-Capillary: 84 mg/dL (ref 65–99)

## 2014-11-27 LAB — VITAMIN D 25 HYDROXY (VIT D DEFICIENCY, FRACTURES): VIT D 25 HYDROXY: 45.1 ng/mL (ref 30.0–100.0)

## 2014-11-27 MED ORDER — DOCUSATE SODIUM 100 MG PO CAPS
100.0000 mg | ORAL_CAPSULE | Freq: Two times a day (BID) | ORAL | Status: DC
  Administered 2014-11-27 – 2014-11-29 (×5): 100 mg via ORAL
  Filled 2014-11-27 (×5): qty 1

## 2014-11-27 MED ORDER — ACETAMINOPHEN 500 MG PO TABS: 1000.0000 mg | ORAL_TABLET | Freq: Four times a day (QID) | ORAL | Status: DC

## 2014-11-27 MED ORDER — TRAMADOL HCL 50 MG PO TABS: 50.0000 mg | ORAL_TABLET | Freq: Two times a day (BID) | ORAL | Status: DC | PRN

## 2014-11-27 MED ORDER — POLYETHYLENE GLYCOL 3350 17 G PO PACK: 17.0000 g | PACK | Freq: Every day | ORAL | Status: DC

## 2014-11-27 MED ORDER — POLYETHYLENE GLYCOL 3350 17 G PO PACK
17.0000 g | PACK | Freq: Two times a day (BID) | ORAL | Status: DC
  Administered 2014-11-27 – 2014-11-29 (×4): 17 g via ORAL
  Filled 2014-11-27 (×4): qty 1

## 2014-11-27 MED ORDER — DOCUSATE SODIUM 100 MG PO CAPS: 100.0000 mg | ORAL_CAPSULE | Freq: Two times a day (BID) | ORAL | Status: DC

## 2014-11-27 MED ORDER — POLYETHYLENE GLYCOL 3350 17 G PO PACK
17.0000 g | PACK | Freq: Every day | ORAL | Status: DC
  Administered 2014-11-27: 17 g via ORAL
  Filled 2014-11-27: qty 1

## 2014-11-27 MED ORDER — BISACODYL 10 MG RE SUPP
10.0000 mg | Freq: Once | RECTAL | Status: AC
  Administered 2014-11-27: 10 mg via RECTAL

## 2014-11-27 MED ORDER — ACETAMINOPHEN 500 MG PO TABS
1000.0000 mg | ORAL_TABLET | Freq: Four times a day (QID) | ORAL | Status: DC
  Administered 2014-11-27: 1000 mg via ORAL
  Administered 2014-11-27: 500 mg via ORAL
  Administered 2014-11-27 – 2014-11-28 (×2): 1000 mg via ORAL
  Administered 2014-11-28: 500 mg via ORAL
  Administered 2014-11-28 – 2014-11-29 (×4): 1000 mg via ORAL
  Filled 2014-11-27 (×9): qty 2

## 2014-11-27 MED ORDER — TRAMADOL HCL 50 MG PO TABS: 50.0000 mg | ORAL_TABLET | Freq: Four times a day (QID) | ORAL | Status: DC | PRN

## 2014-11-27 NOTE — Progress Notes (Signed)
Occupational Therapy Treatment Patient Details Name: SALIAH CRISP MRN: [id number] DOB: 01-13-1922 Today's Date: 11/27/2014    History of present illness Pt is a 81 y.o. Female s/p R ORIF for humerus fx sustained in ground level fall.    OT comments  Pt seen this afternoon for ADL education. Pt/daughter report that pt was limited by dizziness during PT session. Pt sat EOB during OT session for grooming and ADL training. Initial sitting BP: 118/67, after 3 minutes 112/77, after 6 minutes 96/47. Pt to continue benefit from acute OT per POC.    Follow Up Recommendations  Home health OT;Supervision/Assistance - 24 hour    Equipment Recommendations  3 in 1 bedside comode    Recommendations for Other Services      Precautions / Restrictions Precautions Precautions: Fall Type of Shoulder Precautions: Per MD note: Pendulums (as tolerated); gentle passive FF and ABD; NO active abd of shoulder; A/AROM of elbow,wrist, hand as tolerated Shoulder Interventions: Shoulder sling/immobilizer (on for mobility; off at rest) Precaution Booklet Issued: Yes (comment) Precaution Comments: Educated pt/daughter on precautions. Pt with orthostasis today Required Braces or Orthoses: Sling Restrictions Weight Bearing Restrictions: Yes RUE Weight Bearing: Non weight bearing       Mobility Bed Mobility Overal bed mobility: Needs Assistance Bed Mobility: Rolling;Sidelying to Sit;Sit to Supine Rolling: Supervision Sidelying to sit: Min assist;HOB elevated   Sit to supine: Min assist   General bed mobility comments: Min assist with HOB elevated for trucal support, using bed rail as able with LUE. Cues to maintain RUE precautions.  Transfers                 General transfer comment: Not addressed due to orthostatics and fatigue.         ADL Overall ADL's : Needs assistance/impaired                                       General ADL Comments: Focused on educating  caregiver to assist with ADLs. Pt reports feeling dizzy with PT when standing and noted orthostasis. Pt sat EOB for grooming activities and BP noted initially (118/67) then at 3 minutes (112/77) then at 6 minutes (96/47). Pt became fatigued sitting EOB due to poor trunk strength and back pain and returned to supine. Encouraged pt to get OOB for dinner and to sit in recliner with assistance.                 Cognition  Arousal/Alertness: Awake/Alert Behavior During Therapy: WFL for tasks assessed/performed Overall Cognitive Status: Within Functional Limits for tasks assessed                         Exercises Donning/doffing shirt without moving shoulder: Caregiver independent with task Method for sponge bathing under operated UE: Caregiver independent with task Donning/doffing sling/immobilizer: Caregiver independent with task Correct positioning of sling/immobilizer: Caregiver independent with task ROM for elbow, wrist and digits of operated UE: Supervision/safety Sling wearing schedule (on at all times/off for ADL's): Supervision/safety Proper positioning of operated UE when showering: Supervision/safety Positioning of UE while sleeping: Supervision/safety   Shoulder Instructions Shoulder Instructions Donning/doffing shirt without moving shoulder: Caregiver independent with task Method for sponge bathing under operated UE: Caregiver independent with task Donning/doffing sling/immobilizer: Caregiver independent with task Correct positioning of sling/immobilizer: Caregiver independent with task ROM for elbow, wrist and digits of operated UE:  Supervision/safety Sling wearing schedule (on at all times/off for ADL's): Supervision/safety Proper positioning of operated UE when showering: Supervision/safety Positioning of UE while sleeping: Supervision/safety     General Comments  Reviewed ROM exercises from earlier with pt/daughter.      Pertinent Vitals/ Pain       Pain  Assessment: 0-10 Pain Score: 4  Pain Location: R shoulder Pain Descriptors / Indicators: Aching Pain Intervention(s): Limited activity within patient's tolerance;Monitored during session;Repositioned     Prior Functioning/Environment              Frequency Min 2X/week     Progress Toward Goals  OT Goals(current goals can now be found in the care plan section)  Progress towards OT goals: Progressing toward goals     Plan Discharge plan remains appropriate       End of Session Equipment Utilized During Treatment: Other (comment) (sling)   Activity Tolerance Patient tolerated treatment well   Patient Left in bed;with call bell/phone within reach;with family/visitor present   Nurse Communication          Time: 7893-8101 OT Time Calculation (min): 32 min  Charges: OT General Charges $OT Visit: 1 Procedure OT Treatments $Self Care/Home Management : 23-37 mins  Villa Herb M 11/27/2014, 4:36 PM  Secundino Ginger Lynetta Mare, OTR/L Occupational Therapist [phone] (pager)

## 2014-11-27 NOTE — Progress Notes (Signed)
[facility] Progress Note   AVIGAYIL TON VQM:[id number] DOB: [birth year] DOA: 11/25/2014 PCP: Scarlette Calico, MD  Brief narrative: Stephanie Martinez is a 81 y.o. female with HTN, HLP, colon and breast cancer s/p treatment, CKD 3 who presents after a mechanical fall and is found to have a right humeral shaft fracture.   Subjective: Was very dizzy this morning on standing. Her BP drop.  She is feeling better now. Pain controlled on tylenol. Does not want tramadol for now, but ok to keep it available.  No BM since Friday. Passing gas.   Assessment/Plan:  Right  Humerus distal fracture -S/P Open reduction and internal fixation of right humeral shaft -pain controlled with tylenol.  -needs PT , OT Home health  Orthostatic hypotension; Will hold cozaar.  Continue with IV fluids.  Might need to adjust coreg dose.  Repeat Orthostatics in am.   Anemia- due to acute blood loss - Hb 9.4 today- baseline 12- 13- -Hb stable if decrease further will consider transfusion.   Constipation:  Change miralax to BID.     Essential hypertension, benign - cont Coreg, hold Losartan due to orthostatic.  - HCTZ on hold  Hypokalemia - replaced  Dehydration/ mild hyponatremia  - cont IVF - holding HCTZ    GERD (gastroesophageal reflux disease) - cont PPI    CKD (chronic kidney disease), stage III - stable - follow    Code Status: DNR Family Communication: care discussed with daughter at bedside.  Disposition Plan: home when orthotics hypotension resolved. DVT prophylaxis: SCDs- may be bleeding from fracture- on ASA as well Consultants:ortho Procedures:  Antibiotics: Anti-infectives    Start     Dose/Rate Route Frequency Ordered Stop   11/26/14 2200  ceFAZolin (ANCEF) IVPB 2 g/50 mL premix     2 g 100 mL/hr over 30 Minutes Intravenous 3 times per day 11/26/14 1746 11/27/14 2159   11/26/14 1000  ceFAZolin (ANCEF) IVPB 2 g/50 mL premix     2 g 100 mL/hr over 30 Minutes  Intravenous To [facility] 11/25/14 2129 11/26/14 1246      Objective: There were no vitals filed for this visit.  Intake/Output Summary (Last 24 hours) at 11/27/14 1520 Last data filed at 11/26/14 1700  Gross per 24 hour  Intake    100 ml  Output      0 ml  Net    100 ml     Vitals Filed Vitals:   11/26/14 2200 11/27/14 0400 11/27/14 1100 11/27/14 1400  BP: 159/88 160/85 125/67 111/58  Pulse: 81 87  87  Temp: 98.2 F (36.8 C) 97.6 F (36.4 C)  98.4 F (36.9 C)  TempSrc:  Oral    Resp: 18 18  18   SpO2: 100% 97%  92%    Exam:  General:  Pt is alert, not in acute distress  Cardiovascular: regular rate and rhythm, S1/S2 No murmur  Respiratory: clear to auscultation bilaterally   Abdomen: Soft, +Bowel sounds, non tender, non distended, no guarding  MSK: No LE edema, cyanosis or clubbing- right arm in splint  Data Reviewed: Basic Metabolic Panel:  Recent Labs Lab 11/25/14 1655 11/26/14 0620 11/27/14 0516  NA 133* 134* 132*  K 4.2 3.4* 3.6  CL 99* 103 98*  CO2 23 23 24   GLUCOSE 136* 111* 86  BUN 27* 21* 21*  CREATININE 1.34* 1.11* 1.34*  CALCIUM 9.7 7.7* 8.3*   Liver Function Tests:  Recent Labs Lab 11/26/14 0620  AST 19  ALT  15  ALKPHOS 66  BILITOT 0.8  PROT 4.8*  ALBUMIN 2.8*   No results for input(s): LIPASE, AMYLASE in the last 168 hours. No results for input(s): AMMONIA in the last 168 hours. CBC:  Recent Labs Lab 11/25/14 1801 11/26/14 0620 11/26/14 1700 11/27/14 0516  WBC 14.7* 8.3 11.8* 10.1  NEUTROABS 13.2*  --   --   --   HGB 11.6* 9.5* 10.8* 9.4*  HCT 32.8* 26.9* 30.3* 26.5*  MCV 88.6 89.1 89.4 88.6  PLT 265 230 231 223   Cardiac Enzymes: No results for input(s): CKTOTAL, CKMB, CKMBINDEX, TROPONINI in the last 168 hours. BNP (last 3 results) No results for input(s): BNP in the last 8760 hours.  ProBNP (last 3 results) No results for input(s): PROBNP in the last 8760 hours.  CBG:  Recent Labs Lab  11/26/14 0623 11/27/14 0818  GLUCAP 118* 84    Recent Results (from the past 240 hour(s))  Surgical pcr screen     Status: None   Collection Time: 11/26/14  6:46 AM  Result Value Ref Range Status   MRSA, PCR NEGATIVE NEGATIVE Final   Staphylococcus aureus NEGATIVE NEGATIVE Final    Comment:        The Xpert SA Assay (FDA approved for NASAL specimens in patients over 22 years of age), is one component of a comprehensive surveillance program.  Test performance has been validated by [facility] for patients greater than or equal to 69 year old. It is not intended to diagnose infection nor to guide or monitor treatment.      Studies: Dg Chest 2 View  11/25/2014   CLINICAL DATA:  Patient fell today. Tenderness over the RIGHT lower chest. Distal humerus fracture.  EXAM: CHEST  2 VIEW  COMPARISON:  11/14/2014.  FINDINGS: Cardiomegaly. Aortic atherosclerosis. Bibasilar scarring or atelectasis. No overt failure or infiltrates. The bones are demineralized. I do not see a definite rib fracture. There is no pneumothorax.  IMPRESSION: Stable exam. Cardiomegaly with chronic changes. No definite rib fracture or RIGHT pleural effusion.   Electronically Signed   By: Rolla Flatten M.D.   On: 11/25/2014 21:08   Ct Head Wo Contrast  11/25/2014   CLINICAL DATA:  Fall, RIGHT arm pain and deformity, struck head, no loss of consciousness, history hypertension, breast cancer, colon cancer  EXAM: CT HEAD WITHOUT CONTRAST  CT CERVICAL SPINE WITHOUT CONTRAST  TECHNIQUE: Multidetector CT imaging of the head and cervical spine was performed following the standard protocol without intravenous contrast. Multiplanar CT image reconstructions of the cervical spine were also generated.  COMPARISON:  CT head 02/15/2012  FINDINGS: CT HEAD FINDINGS  Generalized atrophy.  Normal ventricular morphology.  No midline shift or mass effect.  Small vessel chronic ischemic changes of deep cerebral white matter.  No intracranial  hemorrhage, mass lesion, or acute infarction.  Visualized paranasal sinuses and mastoid air cells clear.  Bones demineralized.  Small RIGHT frontal scalp hematoma.  Atherosclerotic calcifications at carotid siphons.  CT CERVICAL SPINE FINDINGS  Bones demineralized.  Prevertebral soft tissues normal thickness.  Vertebral body heights maintained.  Disc space narrowing C5-C6 and C6-C7 with minimal retrolisthesis at C5-C6.  Multilevel facet degenerative changes.  Visualized skullbase intact.  No acute fracture, additional subluxation or bone destruction.  Bone island C3 vertebral body.  Lung apices clear.  Tiny nonspecific RIGHT thyroid nodules.  IMPRESSION: No acute intracranial abnormalities.  Atrophy with mild small vessel chronic ischemic changes of deep cerebral white matter.  Osseous demineralization  with degenerative disc and facet disease changes cervical spine and mild retrolisthesis at C5-C6.  No acute cervical spine abnormalities.   Electronically Signed   By: Lavonia Dana M.D.   On: 11/25/2014 17:24   Ct Cervical Spine Wo Contrast  11/25/2014   CLINICAL DATA:  Fall, RIGHT arm pain and deformity, struck head, no loss of consciousness, history hypertension, breast cancer, colon cancer  EXAM: CT HEAD WITHOUT CONTRAST  CT CERVICAL SPINE WITHOUT CONTRAST  TECHNIQUE: Multidetector CT imaging of the head and cervical spine was performed following the standard protocol without intravenous contrast. Multiplanar CT image reconstructions of the cervical spine were also generated.  COMPARISON:  CT head 02/15/2012  FINDINGS: CT HEAD FINDINGS  Generalized atrophy.  Normal ventricular morphology.  No midline shift or mass effect.  Small vessel chronic ischemic changes of deep cerebral white matter.  No intracranial hemorrhage, mass lesion, or acute infarction.  Visualized paranasal sinuses and mastoid air cells clear.  Bones demineralized.  Small RIGHT frontal scalp hematoma.  Atherosclerotic calcifications at carotid  siphons.  CT CERVICAL SPINE FINDINGS  Bones demineralized.  Prevertebral soft tissues normal thickness.  Vertebral body heights maintained.  Disc space narrowing C5-C6 and C6-C7 with minimal retrolisthesis at C5-C6.  Multilevel facet degenerative changes.  Visualized skullbase intact.  No acute fracture, additional subluxation or bone destruction.  Bone island C3 vertebral body.  Lung apices clear.  Tiny nonspecific RIGHT thyroid nodules.  IMPRESSION: No acute intracranial abnormalities.  Atrophy with mild small vessel chronic ischemic changes of deep cerebral white matter.  Osseous demineralization with degenerative disc and facet disease changes cervical spine and mild retrolisthesis at C5-C6.  No acute cervical spine abnormalities.   Electronically Signed   By: Lavonia Dana M.D.   On: 11/25/2014 17:24   Ct Humerus Right Wo Contrast  11/26/2014   CLINICAL DATA:  Further characterization of right distal humerus fracture after fall earlier this day.  EXAM: CT OF THE RIGHT HUMERUS WITHOUT CONTRAST  TECHNIQUE: Multidetector CT imaging was performed according to the standard protocol. Multiplanar CT image reconstructions were also generated.  COMPARISON:  Radiographs earlier this day.  FINDINGS: Fracture of the mid distal humeral diaphysis with 1 cm displacement, mild comminution, and minimal osseous overriding. There is no distal extension to the elbow joint. The proximal humerus is intact. The elbow joint is maintained. No evidence of underlying lesion. There is diffuse soft tissue edema about the upper arm at the fracture site. There is no elbow joint effusion.  IMPRESSION: Fracture of the mid distal humeral diaphysis with displacement, mild comminution and minimal osseous overriding. No extension to the elbow joint.   Electronically Signed   By: Jeb Levering M.D.   On: 11/26/2014 00:55   Ct 3d Recon At Scanner  11/26/2014   CLINICAL DATA:  Abnormal findings on radiological and other examination of  musculoskeletal system. Fracture of the right humerus.  EXAM: 3-DIMENSIONAL CT IMAGE RENDERING ON ACQUISITION WORKSTATION  TECHNIQUE: 3-dimensional CT images were rendered by post-processing of the original CT data on an acquisition workstation.  COMPARISON:  Radiographs dated 11/25/2014  FINDINGS: Three-dimensional images were created and better demonstrate the anatomic relationships of the comminuted spiral fracture of the distal right humeral shaft. There is angulation and displacement. The fracture does not extend into the elbow joint.  IMPRESSION: Comminuted angulated displaced spiral fracture of the distal right humeral shaft.   Electronically Signed   By: Lorriane Shire M.D.   On: 11/26/2014 12:27   Dg  Humerus Right  11/27/2014   CLINICAL DATA:  Humerus fracture.  Postop.  EXAM: RIGHT HUMERUS - 2+ VIEW  COMPARISON:  11/26/2014  FINDINGS: Sequelae of plate and screw fixation of the obliquely oriented mid to distal humeral shaft fracture are again identified. The fracture is in anatomic alignment. A few small osseous fragments are noted at the distal aspect of the fracture. The shoulder and elbow are grossly located.  IMPRESSION: Internal fixation of right humerus fracture in anatomic alignment.   Electronically Signed   By: Logan Bores   On: 11/27/2014 10:16   Dg Humerus Right  11/26/2014   CLINICAL DATA:  Status post surgery for right humeral fracture.  EXAM: RIGHT HUMERUS - 2+ VIEW  COMPARISON:  11/25/2014  FINDINGS: The patient has undergone interval screw and plate fixation of the obliquely oriented distal humerus fracture. The hardware components and the fracture fragments are in anatomic alignment. There is no complication noted.  IMPRESSION: 1. Status post ORIF of distal humerus fracture.   Electronically Signed   By: Kerby Moors M.D.   On: 11/26/2014 16:30   Dg Humerus Right  11/26/2014   CLINICAL DATA:  ORIF right humerus.  EXAM: DG C-ARM 61-120 MIN; RIGHT HUMERUS - 2+ VIEW  COMPARISON:  CT  11/26/2014.  Plain films 11/25/2014  FLUOROSCOPY TIME:  Radiation Exposure Index (as provided by the fluoroscopic device):  If the device does not provide the exposure index:  Fluoroscopy Time:  8 seconds  Number of Acquired Images:  0  FINDINGS: Four intraoperative spot images demonstrate plate and screw fixation across the comminuted right humeral fracture. Anatomic alignment. No complicating feature.  IMPRESSION: Internal fixation across the right humeral fracture.   Electronically Signed   By: Rolm Baptise M.D.   On: 11/26/2014 15:37   Dg Humerus Right  11/25/2014   CLINICAL DATA:  Fall with significant distal arm pain and deformity  EXAM: RIGHT HUMERUS - 2+ VIEW  COMPARISON:  None.  FINDINGS: There is an oblique comminuted fracture through the mid to distal right humerus. Some posterior and lateral displacement of the distal fracture fragment is noted. No other fractures are seen.  IMPRESSION: Fracture of the mid to distal right humerus   Electronically Signed   By: Inez Catalina M.D.   On: 11/25/2014 17:36   Dg C-arm 1-60 Min  11/26/2014   CLINICAL DATA:  ORIF right humerus.  EXAM: DG C-ARM 61-120 MIN; RIGHT HUMERUS - 2+ VIEW  COMPARISON:  CT 11/26/2014.  Plain films 11/25/2014  FLUOROSCOPY TIME:  Radiation Exposure Index (as provided by the fluoroscopic device):  If the device does not provide the exposure index:  Fluoroscopy Time:  8 seconds  Number of Acquired Images:  0  FINDINGS: Four intraoperative spot images demonstrate plate and screw fixation across the comminuted right humeral fracture. Anatomic alignment. No complicating feature.  IMPRESSION: Internal fixation across the right humeral fracture.   Electronically Signed   By: Rolm Baptise M.D.   On: 11/26/2014 15:37    Scheduled Meds:  Scheduled Meds: . acetaminophen  1,000 mg Oral Q6H  . aspirin  81 mg Oral Daily  . calcium-vitamin D  1 tablet Oral Q breakfast  . carvedilol  6.25 mg Oral BID WC  .  ceFAZolin (ANCEF) IV  2 g Intravenous  3 times per day  . Chlorhexidine Gluconate Cloth  6 each Topical Q0600  . dextromethorphan-guaiFENesin  1 tablet Oral BID  . docusate sodium  100 mg Oral BID  .  latanoprost  1 drop Both Eyes QHS  . pantoprazole  40 mg Oral Daily  . polyethylene glycol  17 g Oral BID  . vitamin C  500 mg Oral Daily   Continuous Infusions: . sodium chloride 75 mL/hr at 11/26/14 2206  . sodium chloride 75 mL/hr at 11/25/14 2256    Time spent on care of this patient: 35 min   Elmarie Shiley, MD [phone] 11/27/2014, 3:20 PM  LOS: 2 days   [facility] Office  [phone] Pager - Text Page per [email or web address] If 7PM-7AM, please contact night-coverage [email or web address]

## 2014-11-27 NOTE — Evaluation (Signed)
Physical Therapy Evaluation Patient Details Name: Stephanie Martinez MRN: [id number] DOB: April 28, 1922 Today's Date: 11/27/2014   History of Present Illness  Pt is a 81 y.o. Female s/p R ORIF for humerus fx sustained in ground level fall.   Clinical Impression  Patient is s/p above procedure, presenting with functional limitations due to the deficits listed below (see PT Problem List). Limited assessment due to drop in BP after standing - 68/40, improved to 125/67 after returning to supine. Per daughter, patient has had some vestibular issues in past which has been followed up on extensively. Daughter reports patient has syncopal episodes at home which is being followed by specialists but still unclear of etiology. Both report that this fall resulting in Rt humeral fracture was purely mechanical and not related to these "episodes" patient has from time to time. Limited vestibular evaluation due to pts inability to roll onto right side to assess for canal/cupulolithiasis. Will updated d/c recommendations based on patient's progression. Patient will benefit from skilled PT to increase their independence and safety with mobility to allow discharge to the venue listed below.       Follow Up Recommendations Outpatient PT;Supervision/Assistance - 24 hour    Equipment Recommendations   (TBD pending progress)    Recommendations for Other Services       Precautions / Restrictions Precautions Precautions: Fall Type of Shoulder Precautions: Per MD note: Pendulums (as tolerated); gentle passive FF and ABD; NO active abd of shoulder; A/AROM of elbow,wrist, hand as tolerated Shoulder Interventions: Shoulder sling/immobilizer (on when out of bed) Required Braces or Orthoses: Sling Restrictions Weight Bearing Restrictions: Yes RUE Weight Bearing: Non weight bearing      Mobility  Bed Mobility Overal bed mobility: Needs Assistance Bed Mobility: Rolling;Sidelying to Sit Rolling: Supervision Sidelying  to sit: Min assist;HOB elevated       General bed mobility comments: Min assist with HOB elevated for trucal support, using bed rail as able with LUE. Cues to maintain RUE precautions.  Transfers Overall transfer level: Needs assistance Equipment used: Hemi-walker Transfers: Sit to/from Stand Sit to Stand: Min assist         General transfer comment: Min assist for boost to stand. Moderate sway noted. VC for hand placement. Leans to posterior upon standing. Unable to tolerate >2 minutes due to dizziness.  Ambulation/Gait                Stairs            Wheelchair Mobility    Modified Rankin (Stroke Patients Only)       Balance Overall balance assessment: Needs assistance;History of Falls Sitting-balance support: No upper extremity supported;Feet supported Sitting balance-Leahy Scale: Fair     Standing balance support: Single extremity supported Standing balance-Leahy Scale: Poor Standing balance comment: Stood edge of bed x2 minutes with intermittent assist for balance. Pt became very dizzy, see vitals below                             Pertinent Vitals/Pain      Home Living Family/patient expects to be discharged to:: Private residence Living Arrangements: Children;Other relatives Available Help at Discharge: Family;Available 24 hours/day Type of Home: House Home Access: Ramped entrance     Home Layout: Able to live on main level with bedroom/bathroom Home Equipment: Walker - 2 wheels;Cane - quad;Shower seat;Transport chair Additional Comments: tripod walker    Prior Function Level of Independence: Independent with assistive device(s)  Comments: independent with bathing/dressing.daughter assisted with shower - supervised     Hand Dominance   Dominant Hand: Left    Extremity/Trunk Assessment   Upper Extremity Assessment: Defer to OT evaluation           Lower Extremity Assessment: Generalized weakness       Cervical / Trunk Assessment: Normal  Communication   Communication: HOH  Cognition Arousal/Alertness: Awake/alert Behavior During Therapy: WFL for tasks assessed/performed Overall Cognitive Status: Within Functional Limits for tasks assessed                      General Comments General comments (skin integrity, edema, etc.): Patient required to sit after standing due to dizziness. BP 68/40 after sitting, improved in supine. Pt and daughter report some symptoms consistent with vestibular dysfunction. Limited assessement however due to inability to roll onto Rt side for canal testing. Daughter reports pt has had epley's performed in past.    Exercises        Assessment/Plan    PT Assessment Patient needs continued PT services  PT Diagnosis Difficulty walking;Acute pain   PT Problem List Decreased strength;Decreased range of motion;Decreased activity tolerance;Decreased balance;Decreased mobility;Decreased knowledge of precautions;Pain  PT Treatment Interventions DME instruction;Gait training;Functional mobility training;Therapeutic activities;Therapeutic exercise;Balance training;Neuromuscular re-education;Patient/family education;Modalities   PT Goals (Current goals can be found in the Care Plan section) Acute Rehab PT Goals Patient Stated Goal: to get back to being independent PT Goal Formulation: With patient Time For Goal Achievement: 12/04/14 Potential to Achieve Goals: Good    Frequency Min 5X/week   Barriers to discharge        Co-evaluation               End of Session Equipment Utilized During Treatment: Gait belt Activity Tolerance: Treatment limited secondary to medical complications (Comment) (low BP) Patient left: in bed;with call bell/phone within reach;with family/visitor present;with SCD's reapplied Nurse Communication: Mobility status;Other (comment) (patient's BP)         Time: 5537-4827 PT Time Calculation (min) (ACUTE ONLY): 30  min   Charges:   PT Evaluation $Initial PT Evaluation Tier I: 1 Procedure PT Treatments $Therapeutic Activity: 8-22 mins   PT G CodesEllouise Newer 11/27/2014, 2:05 PM  Camille Bal [place], [place]

## 2014-11-27 NOTE — Op Note (Signed)
Martinez, [name] NO.:  [id number]  MEDICAL RECORD NO.:  [id number]  LOCATION:  5N05C                        FACILITY:  [facility]  PHYSICIAN:  Astrid Divine. Marcelino Scot, M.D. DATE OF BIRTH:  02/08/1922  DATE OF PROCEDURE:  11/26/2014 DATE OF DISCHARGE:                              OPERATIVE REPORT   PREOPERATIVE DIAGNOSIS:  Right comminuted humeral shaft fracture.  POSTOPERATIVE DIAGNOSIS:  Right comminuted humeral shaft fracture.  PROCEDURE:  Open reduction and internal fixation of right humeral shaft.  SURGEON:  Astrid Divine. Marcelino Scot, M.D.  ASSISTANT:  Jari Pigg, PA-C.  ANESTHESIA:  General.  COMPLICATIONS:  None.  TOURNIQUET:  None.  I/O:  800 mL of crystalloid.  EBL:  150 mL.  DISPOSITION:  To PACU.  CONDITION:  Stable.  BRIEF SUMMARY AND INDICATION FOR PROCEDURE:  Stephanie Martinez is a very pleasant 81 year old female who is hard of hearing who sustained a ground level fall resulting in right humeral shaft fracture.  She is walker dependent and has several medical problems, but they are well compensated.  We discussed with the patient and her daughter the risks and benefits of surgical repair including the possibility of malunion, nonunion, loss of motion, infection, nerve injury, vessel injury, DVT, PE, heart attack, stroke, need for further surgery among others.  They acknowledged these risks and did wish to proceed.  Also discussed the possibility of nonsurgical management, which could result in nonunion or delayed union, subsequent need for further surgery, and associated deconditioning during that time.  They wished to proceed with surgical treatment.  BRIEF SUMMARY OF PROCEDURE:  Ms. Stephanie Martinez was taken to the operating room where general anesthesia was induced.  Her right upper extremity was prepped and draped in usual sterile fashion using chlorhexidine scrub and then ChloraPrep because of the patient's IODINE ALLERGY.  An anterior approach  to the humerus was made carefully protecting the cephalic vein.  The biceps was retracted medially, the brachialis was split in the midline exposing the fracture site, which was cleaned of hematoma with curette and lavage.  It was comminuted with free fragments both medially and laterally.  A reduction, however, could be __________ using adjacent areas where there was more of a simple fracture line. This was done, and then a 14-hole plate was fixed.  I was very careful to avoid any additional stripping to __________ possible.  Standard fixation was achieved proximally and distally and then lag screw fixation in the middle of the fracture, this was followed by placement of locked screws proximally and distally.  In order to obtain sufficient fixation, the plate was allowed to cheat distally slightly.  We were able in this manner to obtain 8 cortices of fixation in the distal and proximal fragments.  The wound was irrigated, bone graft placed in the area of the comminution with cancellous morcellized bone and then layered closure performed with 0-Vicryl, 2-0 Vicryl, and nylon being very delicate with the soft tissues.  Ainsley Spinner, PA-C assisted me throughout, was necessary to protect the radial nerve laterally, to hold and maintain reduction and retraction also with wound closure.  PROGNOSIS:  Stephanie Martinez following repair did have some slight restriction in motion, but given that this  is her nondominant hand obtaining the additional fixation will allow her for more reliable mobility and repair given her preserved function of the dominant left hand should perform well for her clinically.  This was measured at approximately 0 to 115.  This can be removed after healing if absolutely necessary and clinically warranted.  The patient has no restrictions to active motion at this time.  We encouraged early rehab.     Astrid Divine. Marcelino Scot, M.D.     MHH/MEDQ  D:  11/26/2014  T:  11/27/2014  Job:   [id number]

## 2014-11-27 NOTE — Discharge Instructions (Signed)
[facility]thopaedic [facility]uma Service Discharge Instructions   General Discharge Instructions  WEIGHT BEARING STATUS: Nonweightbearing right arm  RANGE OF MOTION/ACTIVITY: No active abduction right shoulder. Active and passive shoulder flexion and extension ok. Unrestricted range of motion right elbow, wrist  PAIN MEDICATION USE AND EXPECTATIONS  You have likely been given narcotic medications to help control your pain.  After a traumatic event that results in an fracture (broken bone) with or without surgery, it is ok to use narcotic pain medications to help control one's pain.  We understand that everyone responds to pain differently and each individual patient will be evaluated on a regular basis for the continued need for narcotic medications. Ideally, narcotic medication use should last no more than 6-8 weeks (coinciding with fracture healing).   As a patient it is your responsibility as well to monitor narcotic medication use and report the amount and frequency you use these medications when you come to your office visit.   We would also advise that if you are using narcotic medications, you should take a dose prior to therapy to maximize you participation.  IF YOU ARE ON NARCOTIC MEDICATIONS IT IS NOT PERMISSIBLE TO OPERATE A MOTOR VEHICLE (MOTORCYCLE/CAR/TRUCK/MOPED) OR HEAVY MACHINERY DO NOT MIX NARCOTICS WITH OTHER CNS (CENTRAL NERVOUS SYSTEM) DEPRESSANTS SUCH AS ALCOHOL  Diet: as you were eating previously.  Can use over the counter stool softeners and bowel preparations, such as Miralax, to help with bowel movements.  Narcotics can be constipating.  Be sure to drink plenty of fluids  Wound Care: Daily dressing changes starting on 11/29/2014. Apply dry dressing over operative incision. Please see detailed instruction the below  STOP SMOKING OR USING NICOTINE PRODUCTS!!!!  As discussed nicotine severely impairs your body's ability to heal surgical and traumatic wounds but also impairs bone  healing.  Wounds and bone heal by forming microscopic blood vessels (angiogenesis) and nicotine is a vasoconstrictor (essentially, shrinks blood vessels).  Therefore, if vasoconstriction occurs to these microscopic blood vessels they essentially disappear and are unable to deliver necessary nutrients to the healing tissue.  This is one modifiable factor that you can do to dramatically increase your chances of healing your injury.    (This means no smoking, no nicotine gum, patches, etc)  DO NOT USE NONSTEROIDAL ANTI-INFLAMMATORY DRUGS (NSAID'S)  Using products such as Advil (ibuprofen), Aleve (naproxen), Motrin (ibuprofen) for additional pain control during fracture healing can delay and/or prevent the healing response.  If you would like to take over the counter (OTC) medication, Tylenol (acetaminophen) is ok.  However, some narcotic medications that are given for pain control contain acetaminophen as well. Therefore, you should not exceed more than 4000 mg of tylenol in a day if you do not have liver disease.  Also note that there are may OTC medicines, such as cold medicines and allergy medicines that my contain tylenol as well.  If you have any questions about medications and/or interactions please ask your doctor/PA or your pharmacist.      ICE AND ELEVATE INJURED/OPERATIVE EXTREMITY  Using ice and elevating the injured extremity above your heart can help with swelling and pain control.  Icing in a pulsatile fashion, such as 20 minutes on and 20 minutes off, can be followed.    Do not place ice directly on skin. Make sure there is a barrier between to skin and the ice pack.    Using frozen items such as frozen peas works well as the conform nicely to the are that needs  to be iced.  USE AN ACE WRAP OR TED HOSE FOR SWELLING CONTROL  In addition to icing and elevation, Ace wraps or TED hose are used to help limit and resolve swelling.  It is recommended to use Ace wraps or TED hose until you are  informed to stop.    When using Ace Wraps start the wrapping distally (farthest away from the body) and wrap proximally (closer to the body)   Example: If you had surgery on your leg or thing and you do not have a splint on, start the ace wrap at the toes and work your way up to the thigh        If you had surgery on your upper extremity and do not have a splint on, start the ace wrap at your fingers and work your way up to the upper arm  IF YOU ARE IN A SPLINT OR CAST DO NOT [place]   If your splint gets wet for any reason please contact the office immediately. You may shower in your splint or cast as long as you keep it dry.  This can be done by wrapping in a cast cover or garbage back (or similar)  Do Not stick any thing down your splint or cast such as pencils, money, or hangers to try and scratch yourself with.  If you feel itchy take benadryl as prescribed on the bottle for itching  IF YOU ARE IN A CAM BOOT (BLACK BOOT)  You may remove boot periodically. Perform daily dressing changes as noted below.  Wash the liner of the boot regularly and wear a sock when wearing the boot. It is recommended that you sleep in the boot until told otherwise  CALL THE OFFICE WITH ANY QUESTIONS OR CONCERTS: [phone]     Discharge Pin Site Instructions  Dress pins daily with Kerlix roll starting on POD 2. Wrap the Kerlix so that it tamps the skin down around the pin-skin interface to prevent/limit motion of the skin relative to the pin.  (Pin-skin motion is the primary cause of pain and infection related to external fixator pin sites).  Remove any crust or coagulum that may obstruct drainage with a saline moistened gauze or soap and water.  After POD 3, if there is no discernable drainage on the pin site dressing, the interval for change can by increased to every other day.  You may shower with the fixator, cleaning all pin sites gently with soap and water.  If you have a surgical  wound this needs to be completely dry and without drainage before showering.  The extremity can be lifted by the fixator to facilitate wound care and transfers.  Notify the office/Doctor if you experience increasing drainage, redness, or pain from a pin site, or if you notice purulent (thick, snot-like) drainage.  Discharge Wound Care Instructions  Do NOT apply any ointments, solutions or lotions to pin sites or surgical wounds.  These prevent needed drainage and even though solutions like hydrogen peroxide kill bacteria, they also damage cells lining the pin sites that help fight infection.  Applying lotions or ointments can keep the wounds moist and can cause them to breakdown and open up as well. This can increase the risk for infection. When in doubt call the office.  Surgical incisions should be dressed daily.  If any drainage is noted, use one layer of adaptic, then gauze, Kerlix, and an ace wrap.  Once the incision is completely dry and  without drainage, it may be left open to air out.  Showering may begin 36-48 hours later.  Cleaning gently with soap and water.  Traumatic wounds should be dressed daily as well.    One layer of adaptic, gauze, Kerlix, then ace wrap.  The adaptic can be discontinued once the draining has ceased    If you have a wet to dry dressing: wet the gauze with saline the squeeze as much saline out so the gauze is moist (not soaking wet), place moistened gauze over wound, then place a dry gauze over the moist one, followed by Kerlix wrap, then ace wrap.

## 2014-11-27 NOTE — Progress Notes (Signed)
[facility]opa[facility] Trauma [facility] Progress Note  Subjective  Doing well this morning Pain tolerable  sling a little bothersome Does complain of some nausea, no vomiting  Review of Systems  Constitutional: Negative for fever and chills.  Respiratory: Negative for shortness of breath and wheezing.   Cardiovascular: Negative for chest pain and palpitations.  Gastrointestinal: Positive for nausea and constipation (x 1 week). Negative for vomiting.  Neurological: Negative for tingling, sensory change and headaches.     Objective   BP 160/85 mmHg  Pulse 87  Temp(Src) 97.6 F (36.4 C) (Oral)  Resp 18  SpO2 97%  Intake/Output      06/07 0701 - 06/08 0700 06/08 0701 - 06/09 0700   P.O. 0    I.V. 900    Total Intake 900     Blood 150    Total Output 150     Net +750          Urine Occurrence  1 x     Labs  Results for Stephanie Martinez, Stephanie Martinez (MRN [id number]) as of 11/27/2014 08:36  Ref. Range 11/27/2014 05:16  Sodium Latest Ref Range: 135-145 mmol/L 132 (L)  Potassium Latest Ref Range: 3.5-5.1 mmol/L 3.6  Chloride Latest Ref Range: 101-111 mmol/L 98 (L)  CO2 Latest Ref Range: 22-32 mmol/L 24  BUN Latest Ref Range: 6-20 mg/dL 21 (H)  Creatinine Latest Ref Range: 0.44-1.00 mg/dL 1.34 (H)  Calcium Latest Ref Range: 8.9-10.3 mg/dL 8.3 (L)  EGFR (Non-African Amer.) Latest Ref Range: >60 mL/min 34 (L)  EGFR (African American) Latest Ref Range: >60 mL/min 39 (L)  Glucose Latest Ref Range: 65-99 mg/dL 86  Anion gap Latest Ref Range: 5-15  10  WBC Latest Ref Range: 4.0-10.5 K/uL 10.1  RBC Latest Ref Range: 3.87-5.11 MIL/uL 2.99 (L)  Hemoglobin Latest Ref Range: 12.0-15.0 g/dL 9.4 (L)  HCT Latest Ref Range: 36.0-46.0 % 26.5 (L)  MCV Latest Ref Range: 78.0-100.0 fL 88.6  MCH Latest Ref Range: 26.0-34.0 pg 31.4  MCHC Latest Ref Range: 30.0-36.0 g/dL 35.5  RDW Latest Ref Range: 11.5-15.5 % 12.1  Platelets Latest Ref Range: 150-400 K/uL 223   Exam  UUV:OZDGU and alert, resting  comfortably in bed  Lungs:Clear bilateral anterior fields  Cardiac:S1 and S2, regular rate and rhythm  Abd:+ Bowel sounds, nontender  Ext:       Right upper extremity  Sling fitting well  Ace intact  Radial, ulnar, median and axillary nerve motor and sensory functions grossly intact  Extremity is warm  Palpable radial pulse  Mild swelling distally to the hand  Good passive and active motion of her fingers and wrist.   Assessment and Plan   POD/HD#: 21   81 year old left-hand-dominant female status post ground level fall with comminuted right distal third humeral shaft fracture  1. Comminuted right distal third humeral shaft fracture s/p ORIF  Essentially nonweightbearing for the next 6 weeks or so  Early rehabilitation including active and passive finger, wrist, forearm, elbow motion  Active and passive shoulder flexion and extension  Passive abduction of the shoulder  Patient is not permitted active abduction of her shoulder for approximately 4-6 weeks  Sling on when mobilizing otherwise he can be off when in bed or chair    Ice and elevate as needed for swelling and pain control  PT and OT consults  Given patient's history of falls and vertigo will place on her for vestibular eval. Patient not currently experiencing vertigo type symptoms but this may be a pre-existing  issue for the patient related to her falls.               2. Pain management:              Tylenol as primary pain medicine 500- 1,000 mg every 6 hours  Ultram as needed for breakthrough pain 50-100 milligrams every 6 hours for severe pain  3. ABL anemia/Hemodynamics  Stable  4. Medical issues               Per primary service  5. DVT/PE prophylaxis:             will not require pharmacologic DVT prophylaxis   Okay to resume aspirin  6. Metabolic Bone Disease:             Vitamin D levels are good at 45 ng/mL  7. Activity:  Therapy eval  See #1  8. FEN/Foley/Lines:  Advance as tolerated  9.  Dispo:              PT and OT evaluations  Okay to discharge from orthopedic standpoint the patient cleared by PT  Follow-up with orthopedics in 10-14 days   Jari Pigg, PA-C [facility] [phone] (O) 11/27/2014 8:35 AM

## 2014-11-27 NOTE — Progress Notes (Signed)
Occupational Therapy Treatment Patient Details Name: Stephanie Martinez MRN: [id number] DOB: 1921-11-22 Today's Date: 11/27/2014    History of present illness Pt is a 81 y.o. Female s/p R ORIF for humerus fx sustained in ground level fall.    OT comments  Pt seen today s/p ORIF for therapeutic exercises and education. Daughter present and assisted pt in therapeutic ROM appropriately. Pt was limited by nausea and OT will plan to follow up today for ADL session.    Follow Up Recommendations  Home health OT;Supervision/Assistance - 24 hour    Equipment Recommendations  3 in 1 bedside comode    Recommendations for Other Services      Precautions / Restrictions Precautions Precautions: Fall;Shoulder Type of Shoulder Precautions: Per MD note: Pendulums (as tolerated); gentle passive FF and ABD; NO active abd of shoulder; A/AROM of elbow,wrist, hand as tolerated Shoulder Interventions: Shoulder sling/immobilizer (on for mobility; off at rest) Precaution Booklet Issued: Yes (comment) Precaution Comments: Educated pt/daughter on precautions  Required Braces or Orthoses: Sling Restrictions Weight Bearing Restrictions: Yes RUE Weight Bearing: Non weight bearing       Mobility Bed Mobility               General bed mobility comments: Not addressed due to nausea  Transfers                 General transfer comment: Not addressed due to nausea    Balance Overall balance assessment: History of Falls                                 ADL Overall ADL's : Needs assistance/impaired                                       General ADL Comments: Max A for ADLs. Pt nauseous and tolerated bed level RUE exercises per MD instruction. OT plans to return this PM for ADL session with daughter present after PT vestibular.                 Cognition  Arousal/Alertness: Awake/Alert Behavior During Therapy: WFL for tasks assessed/performed Overall  Cognitive Status: Within Functional Limits for tasks assessed                         Exercises Shoulder Exercises Pendulum Exercise:  (educated pt/daughter to perform only as tolerated in sitting) Shoulder Flexion: AAROM;Right;10 reps;Supine (with OT and daughter assist) Elbow Flexion: AAROM;Right;10 reps;Supine Elbow Extension: AAROM;Right;10 reps;Supine Wrist Flexion: AROM;Right;10 reps;Supine Wrist Extension: AROM;Right;10 reps;Supine Digit Composite Flexion: AROM;Right;10 reps;Supine Composite Extension: AROM;Right;10 reps;Supine           Pertinent Vitals/ Pain       Pain Assessment: 0-10 Pain Score: 4  Pain Location: R shoulder Pain Descriptors / Indicators: Aching;Sore Pain Intervention(s): Limited activity within patient's tolerance;Monitored during session;Repositioned         Frequency Min 2X/week     Progress Toward Goals  OT Goals(current goals can now be found in the care plan section)  Progress towards OT goals: Progressing toward goals     Plan Discharge plan remains appropriate       End of Session Equipment Utilized During Treatment: Other (comment) (sling)   Activity Tolerance Patient tolerated treatment well;Other (comment) (limited by nausea)   Patient Left in bed;with  call bell/phone within reach;with family/visitor present   Nurse Communication          Time: [phone] OT Time Calculation (min): 19 min  Charges: OT General Charges $OT Visit: 1 Procedure OT Treatments $Therapeutic Exercise: 8-22 mins  Juluis Rainier 11/27/2014, 9:40 AM  Cyndie Chime, OTR/L Occupational Therapist [phone] (pager)

## 2014-11-27 NOTE — Care Management Utilization Note (Signed)
Utilization review completed by Eutimio Gharibian N. Raeshaun Simson, RN BSN 

## 2014-11-28 DIAGNOSIS — D62 Acute posthemorrhagic anemia: Secondary | ICD-10-CM

## 2014-11-28 DIAGNOSIS — I951 Orthostatic hypotension: Secondary | ICD-10-CM

## 2014-11-28 LAB — CBC
HEMATOCRIT: 24.2 % — AB (ref 36.0–46.0)
HEMOGLOBIN: 8.6 g/dL — AB (ref 12.0–15.0)
MCH: 32.1 pg (ref 26.0–34.0)
MCHC: 35.5 g/dL (ref 30.0–36.0)
MCV: 90.3 fL (ref 78.0–100.0)
PLATELETS: 209 10*3/uL (ref 150–400)
RBC: 2.68 MIL/uL — AB (ref 3.87–5.11)
RDW: 12.3 % (ref 11.5–15.5)
WBC: 8.3 10*3/uL (ref 4.0–10.5)

## 2014-11-28 LAB — BASIC METABOLIC PANEL
Anion gap: 9 (ref 5–15)
BUN: 23 mg/dL — ABNORMAL HIGH (ref 6–20)
CO2: 24 mmol/L (ref 22–32)
Calcium: 8.1 mg/dL — ABNORMAL LOW (ref 8.9–10.3)
Chloride: 100 mmol/L — ABNORMAL LOW (ref 101–111)
Creatinine, Ser: 1.6 mg/dL — ABNORMAL HIGH (ref 0.44–1.00)
GFR calc Af Amer: 31 mL/min — ABNORMAL LOW (ref >60)
GFR, EST NON AFRICAN AMERICAN: 27 mL/min — AB (ref >60)
Glucose, Bld: 102 mg/dL — ABNORMAL HIGH (ref 65–99)
Potassium: 3.2 mmol/L — ABNORMAL LOW (ref 3.5–5.1)
Sodium: 133 mmol/L — ABNORMAL LOW (ref 135–145)

## 2014-11-28 LAB — GLUCOSE, CAPILLARY: GLUCOSE-CAPILLARY: 112 mg/dL — AB (ref 65–99)

## 2014-11-28 LAB — PREPARE RBC (CROSSMATCH)

## 2014-11-28 MED ORDER — BISACODYL 10 MG RE SUPP
10.0000 mg | Freq: Every day | RECTAL | Status: DC | PRN
Start: 2014-11-28 — End: 2014-11-29

## 2014-11-28 MED ORDER — POTASSIUM CHLORIDE CRYS ER 20 MEQ PO TBCR
20.0000 meq | EXTENDED_RELEASE_TABLET | Freq: Once | ORAL | Status: AC
  Administered 2014-11-28: 20 meq via ORAL
  Filled 2014-11-28: qty 1

## 2014-11-28 MED ORDER — MINERAL OIL RE ENEM
1.0000 | ENEMA | Freq: Once | RECTAL | Status: AC
  Administered 2014-11-28: 1 via RECTAL
  Filled 2014-11-28: qty 1

## 2014-11-28 MED ORDER — MAGNESIUM CITRATE PO SOLN
0.5000 | Freq: Once | ORAL | Status: AC | PRN
  Administered 2014-11-28: 1 via ORAL
  Filled 2014-11-28: qty 296

## 2014-11-28 MED ORDER — SODIUM CHLORIDE 0.9 % IV SOLN: Freq: Once | INTRAVENOUS | Status: DC

## 2014-11-28 NOTE — Progress Notes (Signed)
OT Cancellation Note  Patient Details Name: Stephanie Martinez MRN: [id number] DOB: 24-Mar-1922   Cancelled Treatment:    Reason Eval/Treat Not Completed: Other (comment).  Pt is receiving blood.  Will try to check back later.    Ngina Royer 11/28/2014, 9:32 AM  Lesle Chris, OTR/L [phone] 11/28/2014

## 2014-11-28 NOTE — Progress Notes (Signed)
Physical Therapy Treatment Patient Details Name: PAUL TORPEY MRN: [id number] DOB: 1921/12/12 Today's Date: 11/28/2014    History of Present Illness Pt is a 81 y.o. Female s/p R ORIF for humerus fx sustained in ground level fall.     PT Comments    Patient progressing today and able to ambulate. She has some anxiety with ambulation due to fear of falling again however she gained some confidence as ambulation increased. Continue to work on progressing ambulation as able. Will required assistance 24/7 at home.   Follow Up Recommendations  Outpatient PT;Supervision/Assistance - 24 hour     Equipment Recommendations       Recommendations for Other Services       Precautions / Restrictions Precautions Precautions: Fall Type of Shoulder Precautions: Per MD note: Pendulums (as tolerated); gentle passive FF and ABD; NO active abd of shoulder; A/AROM of elbow,wrist, hand as tolerated Shoulder Interventions: Shoulder sling/immobilizer Precaution Booklet Issued: Yes (comment) Precaution Comments: reinforced precautions Required Braces or Orthoses: Sling Restrictions RUE Weight Bearing: Non weight bearing    Mobility  Bed Mobility               General bed mobility comments: pt up in chair  Transfers Overall transfer level: Needs assistance Equipment used: 1 person hand held assist Transfers: Sit to/from Stand Sit to Stand: Mod assist Stand pivot transfers: Min assist       General transfer comment: assist to rise from lower surface of recliner.   Ambulation/Gait Ambulation/Gait assistance: Min assist Ambulation Distance (Feet): 32 Feet   Gait Pattern/deviations: Step-through pattern;Shuffle;Step-to pattern   Gait velocity interpretation: <1.8 ft/sec, indicative of risk for recurrent falls General Gait Details: Patient guarded and anxious with gait due to fear of falling again. Confidence increased with distance   Stairs            Wheelchair  Mobility    Modified Rankin (Stroke Patients Only)       Balance                                    Cognition Arousal/Alertness: Awake/alert Behavior During Therapy: WFL for tasks assessed/performed Overall Cognitive Status: Within Functional Limits for tasks assessed                      Exercises Donning/doffing shirt without moving shoulder:  (caregiver observed) Method for sponge bathing under operated UE:  (supervision/cues to caregiver) ROM for elbow, wrist and digits of operated UE: Supervision/safety    General Comments        Pertinent Vitals/Pain Pain Assessment: No/denies pain Pain Score: 2  Pain Location: R shoulder Pain Descriptors / Indicators: Sore Pain Intervention(s): Limited activity within patient's tolerance    Home Living                      Prior Function            PT Goals (current goals can now be found in the care plan section) Progress towards PT goals: Progressing toward goals    Frequency  Min 5X/week    PT Plan Current plan remains appropriate    Co-evaluation             End of Session Equipment Utilized During Treatment: Gait belt Activity Tolerance: Patient tolerated treatment well Patient left: in chair;with call bell/phone within reach     Time: [id number]  PT Time Calculation (min) (ACUTE ONLY): 14 min  Charges:  $Gait Training: 8-22 mins                    G Codes:      Jacqualyn Posey 11/28/2014, 3:42 PM 11/28/2014 Jacqualyn Posey PTA [phone] pager [phone] office

## 2014-11-28 NOTE — Progress Notes (Signed)
[facility] Progress Note   ILIZA BLANKENBECKLER ZOX:[id number] DOB: 1922/02/20 DOA: 11/25/2014 PCP: Scarlette Calico, MD  Brief narrative: Stephanie Martinez is a 81 y.o. female with HTN, HLP, colon and breast cancer s/p treatment, CKD 3 who presents after a mechanical fall and is found to have a right humeral shaft fracture.   Subjective: Feels abdominal distension, no BM yet. Agree to try enema today.    Assessment/Plan:  Right  Humerus distal fracture -S/P Open reduction and internal fixation of right humeral shaft -pain controlled with tylenol.  -needs PT , OT Home health  Orthostatic hypotension; Will hold cozaar.  Continue with IV fluids.  Repeated Orthostatics this am improved.   Acute CKD (chronic kidney disease), stage III - Worsening renal function. Suspect hemodynamic mediated. Cr increase to 1.6.  -Continue with IV fluids.  Continue to hold cozaar.  Strict I and O  Check labs in am.   Anemia- due to acute blood loss - Hb 8 today- baseline 12- 13- -She has had 3 gr drops of hb. Will transfuse one unit PRBC/   Constipation:  Continue with  miralax BID.  Will order enema today.     Essential hypertension, benign - cont Coreg, hold Losartan due to orthostatic.  - HCTZ on hold  Hypokalemia - one time dose Kcl  Dehydration/ mild hyponatremia  - cont IVF - holding HCTZ    GERD (gastroesophageal reflux disease) - cont PPI     Code Status: DNR Family Communication: care discussed with daughter at bedside.  Disposition Plan: home when orthotics hypotension resolved. DVT prophylaxis: SCDs- may be bleeding from fracture- on ASA as well Consultants:ortho   Antibiotics: Anti-infectives    Start     Dose/Rate Route Frequency Ordered Stop   11/26/14 2200  ceFAZolin (ANCEF) IVPB 2 g/50 mL premix     2 g 100 mL/hr over 30 Minutes Intravenous 3 times per day 11/26/14 1746 11/27/14 1555   11/26/14 1000  ceFAZolin (ANCEF) IVPB 2 g/50 mL premix     2 g 100  mL/hr over 30 Minutes Intravenous To [facility] 11/25/14 2129 11/26/14 1246      Objective: There were no vitals filed for this visit.  Intake/Output Summary (Last 24 hours) at 11/28/14 0947 Last data filed at 11/28/14 0945  Gross per 24 hour  Intake 1972.5 ml  Output      0 ml  Net 1972.5 ml     Vitals Filed Vitals:   11/27/14 2200 11/28/14 0459 11/28/14 0820 11/28/14 0847  BP: 154/47 152/64 131/53 140/61  Pulse: 78 79 91 89  Temp: 98.8 F (37.1 C) 97.8 F (36.6 C) 98 F (36.7 C) 98.2 F (36.8 C)  TempSrc: Oral Oral Oral Oral  Resp: 17 17 18 20   SpO2: 96% 96%  98%    Exam:  General:  Pt is alert, not in acute distress  Cardiovascular: regular rate and rhythm, S1/S2 No murmur  Respiratory: clear to auscultation bilaterally   Abdomen: Soft,  non tender, non distended, no guarding  MSK: No LE edema, cyanosis or clubbing- right arm in splint  Data Reviewed: Basic Metabolic Panel:  Recent Labs Lab 11/25/14 1655 11/26/14 0620 11/27/14 0516 11/28/14 0506  NA 133* 134* 132* 133*  K 4.2 3.4* 3.6 3.2*  CL 99* 103 98* 100*  CO2 23 23 24 24   GLUCOSE 136* 111* 86 102*  BUN 27* 21* 21* 23*  CREATININE 1.34* 1.11* 1.34* 1.60*  CALCIUM 9.7 7.7* 8.3* 8.1*  Liver Function Tests:  Recent Labs Lab Dec 08, 2014 0620  AST 19  ALT 15  ALKPHOS 66  BILITOT 0.8  PROT 4.8*  ALBUMIN 2.8*   No results for input(s): LIPASE, AMYLASE in the last 168 hours. No results for input(s): AMMONIA in the last 168 hours. CBC:  Recent Labs Lab 11/25/14 1801 12-08-14 0620 December 08, 2014 1700 11/27/14 0516 11/28/14 0506  WBC 14.7* 8.3 11.8* 10.1 8.3  NEUTROABS 13.2*  --   --   --   --   HGB 11.6* 9.5* 10.8* 9.4* 8.6*  HCT 32.8* 26.9* 30.3* 26.5* 24.2*  MCV 88.6 89.1 89.4 88.6 90.3  PLT 265 230 231 223 209   Cardiac Enzymes: No results for input(s): CKTOTAL, CKMB, CKMBINDEX, TROPONINI in the last 168 hours. BNP (last 3 results) No results for input(s): BNP in the last  8760 hours.  ProBNP (last 3 results) No results for input(s): PROBNP in the last 8760 hours.  CBG:  Recent Labs Lab 12-08-14 0623 11/27/14 0818 11/28/14 0810  GLUCAP 118* 84 112*    Recent Results (from the past 240 hour(s))  Surgical pcr screen     Status: None   Collection Time: 12/08/14  6:46 AM  Result Value Ref Range Status   MRSA, PCR NEGATIVE NEGATIVE Final   Staphylococcus aureus NEGATIVE NEGATIVE Final    Comment:        The Xpert SA Assay (FDA approved for NASAL specimens in patients over 64 years of age), is one component of a comprehensive surveillance program.  Test performance has been validated by [facility] for patients greater than or equal to 54 year old. It is not intended to diagnose infection nor to guide or monitor treatment.      Studies: Ct 3d Recon At Scanner  12/08/2014   CLINICAL DATA:  Abnormal findings on radiological and other examination of musculoskeletal system. Fracture of the right humerus.  EXAM: 3-DIMENSIONAL CT IMAGE RENDERING ON ACQUISITION WORKSTATION  TECHNIQUE: 3-dimensional CT images were rendered by post-processing of the original CT data on an acquisition workstation.  COMPARISON:  Radiographs dated 11/25/2014  FINDINGS: Three-dimensional images were created and better demonstrate the anatomic relationships of the comminuted spiral fracture of the distal right humeral shaft. There is angulation and displacement. The fracture does not extend into the elbow joint.  IMPRESSION: Comminuted angulated displaced spiral fracture of the distal right humeral shaft.   Electronically Signed   By: Lorriane Shire M.D.   On: 12/08/14 12:27   Dg Humerus Right  11/27/2014   CLINICAL DATA:  Humerus fracture.  Postop.  EXAM: RIGHT HUMERUS - 2+ VIEW  COMPARISON:  08-Dec-2014  FINDINGS: Sequelae of plate and screw fixation of the obliquely oriented mid to distal humeral shaft fracture are again identified. The fracture is in anatomic alignment. A few  small osseous fragments are noted at the distal aspect of the fracture. The shoulder and elbow are grossly located.  IMPRESSION: Internal fixation of right humerus fracture in anatomic alignment.   Electronically Signed   By: Logan Bores   On: 11/27/2014 10:16   Dg Humerus Right  Dec 08, 2014   CLINICAL DATA:  Status post surgery for right humeral fracture.  EXAM: RIGHT HUMERUS - 2+ VIEW  COMPARISON:  11/25/2014  FINDINGS: The patient has undergone interval screw and plate fixation of the obliquely oriented distal humerus fracture. The hardware components and the fracture fragments are in anatomic alignment. There is no complication noted.  IMPRESSION: 1. Status post ORIF of distal  humerus fracture.   Electronically Signed   By: Kerby Moors M.D.   On: 11/26/2014 16:30   Dg Humerus Right  11/26/2014   CLINICAL DATA:  ORIF right humerus.  EXAM: DG C-ARM 61-120 MIN; RIGHT HUMERUS - 2+ VIEW  COMPARISON:  CT 11/26/2014.  Plain films 11/25/2014  FLUOROSCOPY TIME:  Radiation Exposure Index (as provided by the fluoroscopic device):  If the device does not provide the exposure index:  Fluoroscopy Time:  8 seconds  Number of Acquired Images:  0  FINDINGS: Four intraoperative spot images demonstrate plate and screw fixation across the comminuted right humeral fracture. Anatomic alignment. No complicating feature.  IMPRESSION: Internal fixation across the right humeral fracture.   Electronically Signed   By: Rolm Baptise M.D.   On: 11/26/2014 15:37   Dg C-arm 1-60 Min  11/26/2014   CLINICAL DATA:  ORIF right humerus.  EXAM: DG C-ARM 61-120 MIN; RIGHT HUMERUS - 2+ VIEW  COMPARISON:  CT 11/26/2014.  Plain films 11/25/2014  FLUOROSCOPY TIME:  Radiation Exposure Index (as provided by the fluoroscopic device):  If the device does not provide the exposure index:  Fluoroscopy Time:  8 seconds  Number of Acquired Images:  0  FINDINGS: Four intraoperative spot images demonstrate plate and screw fixation across the comminuted  right humeral fracture. Anatomic alignment. No complicating feature.  IMPRESSION: Internal fixation across the right humeral fracture.   Electronically Signed   By: Rolm Baptise M.D.   On: 11/26/2014 15:37    Scheduled Meds:  Scheduled Meds: . sodium chloride   Intravenous Once  . acetaminophen  1,000 mg Oral Q6H  . aspirin  81 mg Oral Daily  . calcium-vitamin D  1 tablet Oral Q breakfast  . carvedilol  6.25 mg Oral BID WC  . Chlorhexidine Gluconate Cloth  6 each Topical Q0600  . dextromethorphan-guaiFENesin  1 tablet Oral BID  . docusate sodium  100 mg Oral BID  . latanoprost  1 drop Both Eyes QHS  . mineral oil  1 enema Rectal Once  . pantoprazole  40 mg Oral Daily  . polyethylene glycol  17 g Oral BID  . vitamin C  500 mg Oral Daily   Continuous Infusions: . sodium chloride 75 mL/hr at 11/27/14 1800    Time spent on care of this patient: 35 min   Antolin Belsito, Cassie Freer, MD [phone] 11/28/2014, 9:47 AM  LOS: 3 days   [facility] Office  [phone] Pager - Text Page per [email or web address] If 7PM-7AM, please contact night-coverage [email or web address]

## 2014-11-28 NOTE — Progress Notes (Signed)
Occupational Therapy Treatment Patient Details Name: Stephanie Martinez MRN: [id number] DOB: 1921/12/10 Today's Date: 11/28/2014    History of present illness Pt is a 81 y.o. Female s/p R ORIF for humerus fx sustained in ground level fall.    OT comments  Worked through [employer] this session and daughter assisted.  Would benefit from one more session to reinforce  Follow Up Recommendations  Home health OT;Supervision/Assistance - 24 hour    Equipment Recommendations  3 in 1 bedside comode    Recommendations for Other Services      Precautions / Restrictions Precautions Precautions: Fall Type of Shoulder Precautions: Per MD note: Pendulums (as tolerated); gentle passive FF and ABD; NO active abd of shoulder; A/AROM of elbow,wrist, hand as tolerated Shoulder Interventions: Shoulder sling/immobilizer Precaution Comments: reinforced precautions Required Braces or Orthoses: Sling Restrictions RUE Weight Bearing: Non weight bearing       Mobility Bed Mobility               General bed mobility comments: pt up in chair  Transfers   Equipment used:  (therapist in front of pt) Transfers: Sit to/from [employer] to Stand: Min assist Stand pivot transfers: Min assist       General transfer comment: assist to rise and steady; used R sling    Balance                                   ADL           Upper Body Bathing:  (performed:  see comments)       Upper Body Dressing :  (performed:  see comments)       Toilet Transfer: Minimal assistance;BSC;Stand-pivot             General ADL Comments: worked on [place] ADLs this session.  Pt needed multimodal cues not to abduct arm during bathing/dressing.  Daughter assisted with ADL with supervision and cued her on cueing pt before and during activity as pt attempts to abduct at times automatically.  recommended lightly touching R upper arm to cue pt not to abduct during ADLs.  She helps  her mother with ADLs. Also recommended pt wash under R breast so daughter can continue to provide the light tactile cue.  Donned hospital gown:  daughter will bring large tshirt and loose button down shirt to practice with      Vision                     Perception     Praxis      Cognition   Behavior During Therapy: [facility] for tasks assessed/performed Overall Cognitive Status: Within Functional Limits for tasks assessed                       Extremity/Trunk Assessment               Exercises Donning/doffing shirt without moving shoulder:  (caregiver observed) Method for sponge bathing under operated UE:  (supervision/cues to caregiver) ROM for elbow, wrist and digits of operated UE: Supervision/safety  Daughter reports that she and pt have been working on UE exercises and verbalizes no AROM for abduction.     Shoulder Instructions Shoulder Instructions Donning/doffing shirt without moving shoulder:  (caregiver observed) Method for sponge bathing under operated UE:  (supervision/cues to caregiver) ROM for elbow, wrist and digits of  operated UE: Supervision/safety     General Comments      Pertinent Vitals/ Pain       Pain Score: 2  Pain Location: R shoulder Pain Descriptors / Indicators: Sore Pain Intervention(s): Limited activity within patient's tolerance  Home Living                                          Prior Functioning/Environment              Frequency Min 2X/week     Progress Toward Goals  OT Goals(current goals can now be found in the care plan section)  Progress towards OT goals: Progressing toward goals  Acute Rehab OT Goals Time For Goal Achievement: 12/03/14  Plan      Co-evaluation                 End of Session     Activity Tolerance Patient tolerated treatment well   Patient Left  (on 3:1 commode with nursing)   Nurse Communication          Time: 0761-5183 OT Time Calculation  (min): 27 min  Charges: OT General Charges $OT Visit: 1 Procedure OT Treatments $Self Care/Home Management : 23-37 mins  Jurell Basista 11/28/2014, 1:07 PM  Lesle Chris, OTR/L [phone] 11/28/2014

## 2014-11-28 NOTE — Progress Notes (Signed)
[facility]opaedic [facility]uma [facility] Progress Note  Subjective  Sitting on bedside commode Just given mineral oil enema R arm feels good, good pain control on tylenol  +flatus No BM Receiving 1 unit PRBC's    Review of Systems  Constitutional: Negative for fever and chills.  Respiratory: Negative for shortness of breath and wheezing.   Cardiovascular: Negative for chest pain and palpitations.  Gastrointestinal: Positive for constipation. Negative for vomiting and abdominal pain.  Neurological: Negative for tingling and sensory change.     Objective   BP 157/70 mmHg  Pulse 73  Temp(Src) 98 F (36.7 C) (Oral)  Resp 18  SpO2 98%  Intake/Output      06/08 0701 - 06/09 0700 06/09 0701 - 06/10 0700   P.O. 240 240   I.V. 1492.5    Blood  300   Total Intake 1732.5 540   Blood     Total Output       Net +1732.5 +540        Urine Occurrence 2 x      Labs  Results for SEE, BEHARRY (MRN [id number]) as of 11/28/2014 11:59  Ref. Range 11/28/2014 05:06  Sodium Latest Ref Range: 135-145 mmol/L 133 (L)  Potassium Latest Ref Range: 3.5-5.1 mmol/L 3.2 (L)  Chloride Latest Ref Range: 101-111 mmol/L 100 (L)  CO2 Latest Ref Range: 22-32 mmol/L 24  BUN Latest Ref Range: 6-20 mg/dL 23 (H)  Creatinine Latest Ref Range: 0.44-1.00 mg/dL 1.60 (H)  Calcium Latest Ref Range: 8.9-10.3 mg/dL 8.1 (L)  EGFR (Non-African Amer.) Latest Ref Range: >60 mL/min 27 (L)  EGFR (African American) Latest Ref Range: >60 mL/min 31 (L)  Glucose Latest Ref Range: 65-99 mg/dL 102 (H)  Anion gap Latest Ref Range: 5-15  9  WBC Latest Ref Range: 4.0-10.5 K/uL 8.3  RBC Latest Ref Range: 3.87-5.11 MIL/uL 2.68 (L)  Hemoglobin Latest Ref Range: 12.0-15.0 g/dL 8.6 (L)  HCT Latest Ref Range: 36.0-46.0 % 24.2 (L)  MCV Latest Ref Range: 78.0-100.0 fL 90.3  MCH Latest Ref Range: 26.0-34.0 pg 32.1  MCHC Latest Ref Range: 30.0-36.0 g/dL 35.5  RDW Latest Ref Range: 11.5-15.5 % 12.3  Platelets Latest Ref Range: 150-400  K/uL 209    Exam  Gen: awake and alert, sitting on BSC  Ext:  Right upper extremity             Sling fitting well             Ace intact             Radial, ulnar, median and axillary nerve motor and sensory functions grossly intact             Extremity is warm             Palpable radial pulse             swelling much improved distally              Good passive and active motion of her fingers and wrist.   Assessment and Plan   POD/HD#: 58   81 year old left-hand-dominant female status post ground level fall with comminuted right distal third humeral shaft fracture  1. Comminuted right distal third humeral shaft fracture s/p ORIF             Essentially nonweightbearing for the next 6 weeks or so             Early rehabilitation including active and passive finger, wrist, forearm, elbow motion  Active and passive shoulder flexion and extension             Passive abduction of the shoulder             Patient is not permitted active abduction of her shoulder for approximately 4-6 weeks             Sling on when mobilizing otherwise he can be off when in bed or chair                          Ice and elevate as needed for swelling and pain control             PT and OT   2. Pain management:              Tylenol as primary pain medicine 500- 1,000 mg every 6 hours             Ultram as needed for breakthrough pain 50-100 milligrams every 6 hours for severe pain  3. ABL anemia/Hemodynamics             cbc in am  Receiving 1 unit PRBC today   4. Medical issues               Per primary service  5. DVT/PE prophylaxis:             will not require pharmacologic DVT prophylaxis               pt on low dose asa   6. Metabolic Bone Disease:             Vitamin D levels are good at 45 ng/mL  7. Activity:             Therapy eval             See #1  8. FEN/Foley/Lines:             Advance as tolerated  9. Dispo:              PT and OT               Follow-up with orthopedics in 10-14 days    Jari Pigg, PA-C [facility] [phone] (O) 11/28/2014 11:58 AM

## 2014-11-29 DIAGNOSIS — D62 Acute posthemorrhagic anemia: Secondary | ICD-10-CM

## 2014-11-29 LAB — TYPE AND SCREEN
ABO/RH(D): O NEG
ANTIBODY SCREEN: NEGATIVE
UNIT DIVISION: 0
Unit division: 0

## 2014-11-29 LAB — CBC
HEMATOCRIT: 26.5 % — AB (ref 36.0–46.0)
Hemoglobin: 9.3 g/dL — ABNORMAL LOW (ref 12.0–15.0)
MCH: 31.4 pg (ref 26.0–34.0)
MCHC: 35.1 g/dL (ref 30.0–36.0)
MCV: 89.5 fL (ref 78.0–100.0)
Platelets: 214 10*3/uL (ref 150–400)
RBC: 2.96 MIL/uL — AB (ref 3.87–5.11)
RDW: 12.9 % (ref 11.5–15.5)
WBC: 7.1 10*3/uL (ref 4.0–10.5)

## 2014-11-29 LAB — BASIC METABOLIC PANEL
Anion gap: 7 (ref 5–15)
BUN: 23 mg/dL — ABNORMAL HIGH (ref 6–20)
CO2: 24 mmol/L (ref 22–32)
Calcium: 8.4 mg/dL — ABNORMAL LOW (ref 8.9–10.3)
Chloride: 104 mmol/L (ref 101–111)
Creatinine, Ser: 1.4 mg/dL — ABNORMAL HIGH (ref 0.44–1.00)
GFR calc Af Amer: 37 mL/min — ABNORMAL LOW (ref >60)
GFR, EST NON AFRICAN AMERICAN: 32 mL/min — AB (ref >60)
Glucose, Bld: 99 mg/dL (ref 65–99)
Potassium: 3.4 mmol/L — ABNORMAL LOW (ref 3.5–5.1)
Sodium: 135 mmol/L (ref 135–145)

## 2014-11-29 LAB — GLUCOSE, CAPILLARY: Glucose-Capillary: 90 mg/dL (ref 65–99)

## 2014-11-29 MED ORDER — AMLODIPINE BESYLATE 2.5 MG PO TABS: 2.5000 mg | ORAL_TABLET | Freq: Every day | ORAL | Status: DC

## 2014-11-29 MED ORDER — POLYETHYLENE GLYCOL 3350 17 G PO PACK: 17.0000 g | PACK | Freq: Two times a day (BID) | ORAL | Status: DC

## 2014-11-29 MED ORDER — POTASSIUM CHLORIDE CRYS ER 20 MEQ PO TBCR
20.0000 meq | EXTENDED_RELEASE_TABLET | Freq: Once | ORAL | Status: AC
  Administered 2014-11-29: 20 meq via ORAL
  Filled 2014-11-29: qty 1

## 2014-11-29 NOTE — Progress Notes (Signed)
Physical Therapy Treatment Patient Details Name: Stephanie Martinez MRN: [id number] DOB: 1922-02-16 Today's Date: 11/29/2014    History of Present Illness Pt is a 81 y.o. Female s/p R ORIF for humerus fx sustained in ground level fall.     PT Comments    Patient was educated on different levels of canes and hemiwalkers. Patient felt most comfortable with use of hemiwalker. Patient progressing well with mobility. She continues to be anxious about falling. Plan is to DC home with assistance from her daughter.   Follow Up Recommendations  Outpatient PT;Supervision/Assistance - 24 hour     Equipment Recommendations       Recommendations for Other Services       Precautions / Restrictions Precautions Precautions: Fall Type of Shoulder Precautions: Per MD note: Pendulums (as tolerated); gentle passive FF and ABD; NO active abd of shoulder; A/AROM of elbow,wrist, hand as tolerated Shoulder Interventions: Shoulder sling/immobilizer Precaution Booklet Issued: Yes (comment) Precaution Comments: reinforced precautions Required Braces or Orthoses: Sling Restrictions RUE Weight Bearing: Non weight bearing    Mobility  Bed Mobility Overal bed mobility: Needs Assistance   Rolling: Supervision Sidelying to sit: Supervision          Transfers Overall transfer level: Needs assistance Equipment used: 1 person hand held assist   Sit to Stand: Min assist         General transfer comment: A for powering up into standing and to ensure balance  Ambulation/Gait Ambulation/Gait assistance: Min guard Ambulation Distance (Feet): 16 Feet Assistive device: Hemi-walker Gait Pattern/deviations: Step-to pattern   Gait velocity interpretation: <1.8 ft/sec, indicative of risk for recurrent falls General Gait Details: Patient initially guarded but progressed well and feels safe with use of hemiwalker. Daughter present and pleased with use of hemiwalker.    Stairs             Wheelchair Mobility    Modified Rankin (Stroke Patients Only)       Balance                                    Cognition Arousal/Alertness: Awake/alert Behavior During Therapy: WFL for tasks assessed/performed Overall Cognitive Status: Within Functional Limits for tasks assessed                      Exercises      General Comments        Pertinent Vitals/Pain Pain Assessment: No/denies pain    Home Living                      Prior Function            PT Goals (current goals can now be found in the care plan section) Progress towards PT goals: Progressing toward goals    Frequency  Min 5X/week    PT Plan Current plan remains appropriate    Co-evaluation             End of Session Equipment Utilized During Treatment: Gait belt Activity Tolerance: Patient tolerated treatment well Patient left: in bed;with call bell/phone within reach     Time: 1024-1051 PT Time Calculation (min) (ACUTE ONLY): 27 min  Charges:  $Gait Training: 8-22 mins $Therapeutic Activity: 8-22 mins                    G Codes:      Ziah Turvey, Gregary Signs  [name] 11/29/2014, 2:32 PM 11/29/2014 Jacqualyn Posey PTA [phone] pager [phone] office

## 2014-11-29 NOTE — Progress Notes (Signed)
[facility] Progress Note  Subjective  Doing well + BM yesterday Pain controlled   ROS  As above   Objective   BP 149/51 mmHg  Pulse 60  Temp(Src) 98.6 F (37 C) (Oral)  Resp 18  SpO2 100%  Intake/Output      06/09 0701 - 06/10 0700 06/10 0701 - 06/11 0700   P.O. 600 480   I.V. 300    Blood 300    Total Intake 1200 480   Net +1200 +480        Urine Occurrence 3 x    Stool Occurrence 2 x      Labs Results for MAKALA, FETTEROLF (MRN [id number]) as of 11/29/2014 08:42  Ref. Range 11/29/2014 03:01  Sodium Latest Ref Range: 135-145 mmol/L 135  Potassium Latest Ref Range: 3.5-5.1 mmol/L 3.4 (L)  Chloride Latest Ref Range: 101-111 mmol/L 104  CO2 Latest Ref Range: 22-32 mmol/L 24  BUN Latest Ref Range: 6-20 mg/dL 23 (H)  Creatinine Latest Ref Range: 0.44-1.00 mg/dL 1.40 (H)  Calcium Latest Ref Range: 8.9-10.3 mg/dL 8.4 (L)  EGFR (Non-African Amer.) Latest Ref Range: >60 mL/min 32 (L)  EGFR (African American) Latest Ref Range: >60 mL/min 37 (L)  Glucose Latest Ref Range: 65-99 mg/dL 99  Anion gap Latest Ref Range: 5-15  7  WBC Latest Ref Range: 4.0-10.5 K/uL 7.1  RBC Latest Ref Range: 3.87-5.11 MIL/uL 2.96 (L)  Hemoglobin Latest Ref Range: 12.0-15.0 g/dL 9.3 (L)  HCT Latest Ref Range: 36.0-46.0 % 26.5 (L)  MCV Latest Ref Range: 78.0-100.0 fL 89.5  MCH Latest Ref Range: 26.0-34.0 pg 31.4  MCHC Latest Ref Range: 30.0-36.0 g/dL 35.1  RDW Latest Ref Range: 11.5-15.5 % 12.9  Platelets Latest Ref Range: 150-400 K/uL 214    Exam  Gen: awake and alert, NAD Ext:       Right Upper Extremity   Dressing removed, incision stable, looks good  Scant drainage  No signs of infection  + ecchymosis to arm   R/U/M motor and sensory functions intact  Ext warm  + Radial pulse  Swelling stable    Assessment and Plan   POD/HD#: 71   81 year old left-hand-dominant female status post ground level fall with comminuted right distal third humeral shaft  fracture  1. Comminuted right distal third humeral shaft fracture s/p ORIF             Essentially nonweightbearing for the next 6 weeks or so             Early rehabilitation including active and passive finger, wrist, forearm, elbow motion             Active and passive shoulder flexion and extension             Passive abduction of the shoulder             Patient is not permitted active abduction of her shoulder for approximately 4-6 weeks             Sling on when mobilizing otherwise he can be off when in bed or chair                          Ice and elevate as needed for swelling and pain control             PT and OT   Dressing changes as needed   2. Pain management:  Tylenol as primary pain medicine 500- 1,000 mg every 6 hours             Ultram as needed for breakthrough pain 50-100 milligrams every 6 hours for severe pain  3. ABL anemia/Hemodynamics             cbc stable   4. Medical issues               Per primary service  5. DVT/PE prophylaxis:             will not require pharmacologic DVT prophylaxis               pt on low dose asa   6. Metabolic Bone Disease:             Vitamin D levels are good at 45 ng/mL  7. Activity:             Therapy eval             See #1  8. FEN/Foley/Lines:             Advance as tolerated  9. Dispo:              PT and OT               Follow-up with orthopedics in 10-14 days     Jari Pigg, PA-C [facility] [phone] (O) 11/29/2014 8:41 AM

## 2014-11-29 NOTE — Progress Notes (Signed)
Occupational Therapy Treatment Patient Details Name: Stephanie Martinez MRN: [id number] DOB: 1922/05/25 Today's Date: 11/29/2014    History of present illness Pt is a 81 y.o. Female s/p R ORIF for humerus fx sustained in ground level fall.    OT comments  Pt. Seen for second session to address UB dressing while maintaining RUE precautions.  Dtr. And pt. Able to return demo with cues for safety and sequencing.    Follow Up Recommendations  Home health OT;Supervision/Assistance - 24 hour    Equipment Recommendations  3 in 1 bedside comode    Recommendations for Other Services      Precautions / Restrictions Precautions Precautions: Fall Type of Shoulder Precautions: Per MD note: Pendulums (as tolerated); gentle passive FF and ABD; NO active abd of shoulder; A/AROM of elbow,wrist, hand as tolerated Shoulder Interventions: Shoulder sling/immobilizer Precaution Comments: reinforced precautions Required Braces or Orthoses: Sling Restrictions Weight Bearing Restrictions: Yes RUE Weight Bearing: Non weight bearing       Mobility Bed Mobility                  Transfers                      Balance                                   ADL                   Upper Body Dressing : Maximal assistance;Sitting;Cueing for UE precautions;Cueing for safety;Cueing for sequencing                     General ADL Comments: educated and provided demo for donning shirt while maintaining shoulder precautions.  pt. and dtr. preferred a reg. shirt vs. button up shirt.  able to safely guide shirt using one handed method and grouping shirt and collar to get over head then pulling R UE through then LUE.  educated to always guid the affected arm through first.      Vision                     Perception     Praxis      Cognition   Behavior During Therapy: [facility] for tasks assessed/performed Overall Cognitive Status: Within Functional Limits  for tasks assessed                       Extremity/Trunk Assessment               Exercises Shoulder Exercises Shoulder Flexion: AAROM;Right;10 reps;Supine Elbow Flexion: AAROM;Right;10 reps;Supine Elbow Extension: AAROM;Right;10 reps;Supine Wrist Flexion: AROM;Right;10 reps;Supine Wrist Extension: AROM;Right;10 reps;Supine Digit Composite Flexion: AROM;Right;10 reps;Supine Composite Extension: AROM;Right;10 reps;Supine Donning/doffing sling/immobilizer: Caregiver independent with task Correct positioning of sling/immobilizer: Caregiver independent with task   Shoulder Instructions Shoulder Instructions Donning/doffing sling/immobilizer: Caregiver independent with task Correct positioning of sling/immobilizer: Caregiver independent with task     General Comments      Pertinent Vitals/ Pain       Pain Assessment: No/denies pain  Home Living                                          Prior Functioning/Environment  Frequency Min 2X/week     Progress Toward Goals  OT Goals(current goals can now be found in the care plan section)  Progress towards OT goals: Progressing toward goals     Plan Discharge plan remains appropriate    Co-evaluation                 End of Session     Activity Tolerance Patient tolerated treatment well   Patient Left in chair;with call bell/phone within reach;with family/visitor present   Nurse Communication          Time: 1100-1116 OT Time Calculation (min): 16 min  Charges: OT General Charges $OT Visit: 1 Procedure OT Treatments $Self Care/Home Management : 8-22 mins $Therapeutic Exercise: 38-52 mins  Janice Coffin, COTA/L 11/29/2014, 11:28 AM

## 2014-11-29 NOTE — Discharge Summary (Signed)
Physician Discharge Summary  Stephanie Martinez QQV:[id number] DOB: May 09, 1922 DOA: 11/25/2014  PCP: Scarlette Calico, MD  Admit date: 11/25/2014 Discharge date: 11/29/2014  Time spent: 35 minutes  Recommendations for Outpatient Follow-up:  1. Needs b-met to follow renal function. If renal function at baseline consider stop Norvasc and resume HCTZ, ACE.  2. CBC to follow hb 3. Needs to follow up with ortho post sx  Discharge Diagnoses:    Humerus distal fracture   Essential hypertension, benign   Pure hypercholesterolemia   GERD (gastroesophageal reflux disease)   Low back pain   CKD (chronic kidney disease), stage III   Orthostatic hypotension   Acute blood loss anemia   Discharge Condition: Stable  Diet recommendation: heart healthy  There were no vitals filed for this visit.  History of present illness:  Stephanie Martinez is a 81 y.o. female with PMH of hypertension, hyperlipidemia, GERD history of breast cancer (post status of lumpectomy and radiation therapy), history of colon cancer (post status of resection), vertigo, osteoarthritis, chronic kidney disease-stage III, who presents with right arm pain after a fall.  Patient reports that she had accidental fall after turning while walking and losing balance at home at about 2:30 PM. No chest pain, weakness, LOC prior to fall. She fell onto her right side injuring her upper arm, then to back, hitting her head against the hardwood floor. She developed a small hematoma over right frontal area, and tenderness over right upper arm and right lower rib cage. She denies neck pain, SOB and abdominal pain. There has been no nausea or vomiting. She moves lower extremities without difficulty or complaint of pain.   Currently patient denies fever, chills, running nose, ear pain, headaches, SOB, abdominal pain, diarrhea, constipation, dysuria, urgency, frequency, hematuria, skin rashes or leg swelling. No unilateral weakness, numbness or tingling  sensations. No vision change or hearing loss.   In ED, patient was found to have fracture of the mid to distal right humerus by X-ray. CT head and C-spine are negative for acute abnormalities. WBC 14.7, temperature normal, no tachycardia, stable renal function. Patient is admitted to inpatient for further evaluation and treatment per orthopedic surgeon was consulted by ED.  Hospital Course:  Right Humerus distal fracture -S/P Open reduction and internal fixation of right humeral shaft -pain controlled with tylenol.  -needs PT , OT Home health  Orthostatic hypotension; Will hold cozaar.  Resolved  with IV fluids.    Acute CKD (chronic kidney disease), stage III - Worsening renal function. Suspect hemodynamic mediated. Cr increase to 1.6. .  Continue to hold cozaar and HCTZ at discharge.  Strict I and O  Improved with IV fluids.   Anemia- due to acute blood loss - Hb 8 today- baseline 12- 13- -She has had 3 gr drops of hb. S/P transfuse one unit PRBC/  -hb increase to 9.  Constipation:  Continue with miralax BID.  Had BM    Essential hypertension, benign - cont Coreg, hold Losartan due to orthostatic.  - HCTZ on hold due to renal failure -BP increasing, will start low dose norvasc.   Hypokalemia - one time dose Kcl  Dehydration/ mild hyponatremia  - Received  IVF - holding HCTZ   GERD (gastroesophageal reflux disease) - cont PPI    Consultations:  ortho  Discharge Exam: Filed Vitals:   11/29/14 0900  BP: 174/88  Pulse: 90  Temp:   Resp:     General: NAD Cardiovascular: S 1, S 2 RRR Respiratory:  CTA  Discharge Instructions   Discharge Instructions    Care order/instruction    Complete by:  As directed   Orthopaedic Trauma Service Discharge Instructions   General Discharge Instructions  WEIGHT BEARING STATUS: Nonweightbearing right arm  RANGE OF MOTION/ACTIVITY: No active abduction right shoulder. Active and passive shoulder  flexion and extension ok. Unrestricted range of motion right elbow, wrist  PAIN MEDICATION USE AND EXPECTATIONS  You have likely been given narcotic medications to help control your pain.  After a traumatic event that results in an fracture (broken bone) with or without surgery, it is ok to use narcotic pain medications to help control one's pain.  We understand that everyone responds to pain differently and each individual patient will be evaluated on a regular basis for the continued need for narcotic medications. Ideally, narcotic medication use should last no more than 6-8 weeks (coinciding with fracture healing).   As a patient it is your responsibility as well to monitor narcotic medication use and report the amount and frequency you use these medications when you come to your office visit.   We would also advise that if you are using narcotic medications, you should take a dose prior to therapy to maximize you participation.  IF YOU ARE ON NARCOTIC MEDICATIONS IT IS NOT PERMISSIBLE TO OPERATE A MOTOR VEHICLE (MOTORCYCLE/CAR/TRUCK/MOPED) OR HEAVY MACHINERY DO NOT MIX NARCOTICS WITH OTHER CNS (CENTRAL NERVOUS SYSTEM) DEPRESSANTS SUCH AS ALCOHOL  Diet: as you were eating previously.  Can use over the counter stool softeners and bowel preparations, such as Miralax, to help with bowel movements.  Narcotics can be constipating.  Be sure to drink plenty of fluids  Wound Care: Daily dressing changes starting on 11/29/2014. Apply dry dressing over operative incision. Please see detailed instruction the below  STOP SMOKING OR USING NICOTINE PRODUCTS!!!!  As discussed nicotine severely impairs your body's ability to heal surgical and traumatic wounds but also impairs bone healing.  Wounds and bone heal by forming microscopic blood vessels (angiogenesis) and nicotine is a vasoconstrictor (essentially, shrinks blood vessels).  Therefore, if vasoconstriction occurs to these microscopic blood vessels they  essentially disappear and are unable to deliver necessary nutrients to the healing tissue.  This is one modifiable factor that you can do to dramatically increase your chances of healing your injury.    (This means no smoking, no nicotine gum, patches, etc)  DO NOT USE NONSTEROIDAL ANTI-INFLAMMATORY DRUGS (NSAID'S)  Using products such as Advil (ibuprofen), Aleve (naproxen), Motrin (ibuprofen) for additional pain control during fracture healing can delay and/or prevent the healing response.  If you would like to take over the counter (OTC) medication, Tylenol (acetaminophen) is ok.  However, some narcotic medications that are given for pain control contain acetaminophen as well. Therefore, you should not exceed more than 4000 mg of tylenol in a day if you do not have liver disease.  Also note that there are may OTC medicines, such as cold medicines and allergy medicines that my contain tylenol as well.  If you have any questions about medications and/or interactions please ask your doctor/PA or your pharmacist.      ICE AND ELEVATE INJURED/OPERATIVE EXTREMITY  Using ice and elevating the injured extremity above your heart can help with swelling and pain control.  Icing in a pulsatile fashion, such as 20 minutes on and 20 minutes off, can be followed.    Do not place ice directly on skin. Make sure there is a barrier between to skin and  the ice pack.    Using frozen items such as frozen peas works well as the conform nicely to the are that needs to be iced.  USE AN ACE WRAP OR TED HOSE FOR SWELLING CONTROL  In addition to icing and elevation, Ace wraps or TED hose are used to help limit and resolve swelling.  It is recommended to use Ace wraps or TED hose until you are informed to stop.    When using Ace Wraps start the wrapping distally (farthest away from the body) and wrap proximally (closer to the body)   Example: If you had surgery on your leg or thing and you do not have a splint on, start the  ace wrap at the toes and work your way up to the thigh        If you had surgery on your upper extremity and do not have a splint on, start the ace wrap at your fingers and work your way up to the upper arm  IF YOU ARE IN A SPLINT OR CAST DO NOT [place]   If your splint gets wet for any reason please contact the office immediately. You may shower in your splint or cast as long as you keep it dry.  This can be done by wrapping in a cast cover or garbage back (or similar)  Do Not stick any thing down your splint or cast such as pencils, money, or hangers to try and scratch yourself with.  If you feel itchy take benadryl as prescribed on the bottle for itching  IF YOU ARE IN A CAM BOOT (BLACK BOOT)  You may remove boot periodically. Perform daily dressing changes as noted below.  Wash the liner of the boot regularly and wear a sock when wearing the boot. It is recommended that you sleep in the boot until told otherwise  CALL THE OFFICE WITH ANY QUESTIONS OR CONCERTS: [phone]     Discharge Pin Site Instructions  Dress pins daily with Kerlix roll starting on POD 2. Wrap the Kerlix so that it tamps the skin down around the pin-skin interface to prevent/limit motion of the skin relative to the pin.  (Pin-skin motion is the primary cause of pain and infection related to external fixator pin sites).  Remove any crust or coagulum that may obstruct drainage with a saline moistened gauze or soap and water.  After POD 3, if there is no discernable drainage on the pin site dressing, the interval for change can by increased to every other day.  You may shower with the fixator, cleaning all pin sites gently with soap and water.  If you have a surgical wound this needs to be completely dry and without drainage before showering.  The extremity can be lifted by the fixator to facilitate wound care and transfers.  Notify the office/Doctor if you experience increasing drainage, redness, or  pain from a pin site, or if you notice purulent (thick, snot-like) drainage.  Discharge Wound Care Instructions  Do NOT apply any ointments, solutions or lotions to pin sites or surgical wounds.  These prevent needed drainage and even though solutions like hydrogen peroxide kill bacteria, they also damage cells lining the pin sites that help fight infection.  Applying lotions or ointments can keep the wounds moist and can cause them to breakdown and open up as well. This can increase the risk for infection. When in doubt call the office.  Surgical incisions should be dressed daily.  If  any drainage is noted, use one layer of adaptic, then gauze, Kerlix, and an ace wrap.  Once the incision is completely dry and without drainage, it may be left open to air out.  Showering may begin 36-48 hours later.  Cleaning gently with soap and water.  Traumatic wounds should be dressed daily as well.    One layer of adaptic, gauze, Kerlix, then ace wrap.  The adaptic can be discontinued once the draining has ceased    If you have a wet to dry dressing: wet the gauze with saline the squeeze as much saline out so the gauze is moist (not soaking wet), place moistened gauze over wound, then place a dry gauze over the moist one, followed by Kerlix wrap, then ace wrap.     Diet - low sodium heart healthy    Complete by:  As directed      Increase activity slowly    Complete by:  As directed           Current Discharge Medication List    START taking these medications   Details  acetaminophen (TYLENOL) 500 MG tablet Take 2 tablets (1,000 mg total) by mouth every 6 (six) hours. Qty: 90 tablet, Refills: 1    amLODipine (NORVASC) 2.5 MG tablet Take 1 tablet (2.5 mg total) by mouth daily. Qty: 30 tablet, Refills: 0    docusate sodium (COLACE) 100 MG capsule Take 1 capsule (100 mg total) by mouth 2 (two) times daily. Qty: 30 capsule, Refills: 1    polyethylene glycol (MIRALAX / GLYCOLAX) packet Take 17 g  by mouth daily. Qty: 14 each, Refills: 0    traMADol (ULTRAM) 50 MG tablet Take 1-2 tablets (50-100 mg total) by mouth every 6 (six) hours as needed for severe pain. Qty: 30 tablet, Refills: 1      CONTINUE these medications which have NOT CHANGED   Details  aspirin 81 MG tablet Take 81 mg by mouth daily.    calcium citrate-vitamin D (CITRACAL+D) 315-200 MG-UNIT per tablet Take 1 tablet by mouth daily.    carvedilol (COREG) 6.25 MG tablet Take 1 tablet (6.25 mg total) by mouth 2 (two) times daily with a meal. Qty: 60 tablet, Refills: 5   Associated Diagnoses: Essential hypertension, benign    GLUCOSAMINE HCL PO Take 1,000 mg by mouth daily.    latanoprost (XALATAN) 0.005 % ophthalmic solution Place 1 drop into both eyes at bedtime.     LUTEIN PO Take 25 mg by mouth daily.    omeprazole (PRILOSEC) 20 MG capsule TAKE 1 CAPSULE BY MOUTH DAILY Qty: 90 capsule, Refills: 3    Umeclidinium Bromide (INCRUSE ELLIPTA) 62.5 MCG/INH AEPB Inhale 1 puff into the lungs daily. Qty: 30 each, Refills: 11   Associated Diagnoses: Obstructive chronic bronchitis with exacerbation    vitamin C (ASCORBIC ACID) 500 MG tablet Take 500 mg by mouth daily.      STOP taking these medications     losartan-hydrochlorothiazide (HYZAAR) 100-12.5 MG per tablet        Allergies  Allergen Reactions  . Iodine Anaphylaxis   Follow-up Information    Follow up with HANDY,MICHAEL H, MD. Schedule an appointment as soon as possible for a visit in 14 days.   Specialty:  Orthopedic Surgery   Why:  For suture removal, For wound re-check   Contact information:   [place] [address] [phone]       Follow up with [facility].   Why:  They will contact you to schedule home therapy visits.   Contact information:   [address] [place] [address] [phone]       Follow up with Scarlette Calico, MD In 1 week.   Specialty:  Internal Medicine    Contact information:   [address] [address] [phone]        The results of significant diagnostics from this hospitalization (including imaging, microbiology, ancillary and laboratory) are listed below for reference.    Significant Diagnostic Studies: Dg Chest 2 View  11/25/2014   CLINICAL DATA:  Patient fell today. Tenderness over the RIGHT lower chest. Distal humerus fracture.  EXAM: CHEST  2 VIEW  COMPARISON:  11/14/2014.  FINDINGS: Cardiomegaly. Aortic atherosclerosis. Bibasilar scarring or atelectasis. No overt failure or infiltrates. The bones are demineralized. I do not see a definite rib fracture. There is no pneumothorax.  IMPRESSION: Stable exam. Cardiomegaly with chronic changes. No definite rib fracture or RIGHT pleural effusion.   Electronically Signed   By: Rolla Flatten M.D.   On: 11/25/2014 21:08   Dg Chest 2 View  11/15/2014   CLINICAL DATA:  Cough for 3 months.  EXAM: CHEST  2 VIEW  COMPARISON:  01/05/2013  FINDINGS: Mediastinum hilar structures are normal. Stable bibasilar subsegmental atelectasis and/or pleural parenchymal scarring. COPD . No pleural effusion or pneumothorax. Heart size stable. No acute bony abnormality. Degenerative changes thoracic spine.  IMPRESSION: Stable bibasilar subsegmental atelectasis and/or pleural parenchymal scarring. COPD. No acute cardiopulmonary disease identified.   Electronically Signed   By: Marcello Moores  Register   On: 11/15/2014 08:05   Ct Head Wo Contrast  11/25/2014   CLINICAL DATA:  Fall, RIGHT arm pain and deformity, struck head, no loss of consciousness, history hypertension, breast cancer, colon cancer  EXAM: CT HEAD WITHOUT CONTRAST  CT CERVICAL SPINE WITHOUT CONTRAST  TECHNIQUE: Multidetector CT imaging of the head and cervical spine was performed following the standard protocol without intravenous contrast. Multiplanar CT image reconstructions of the cervical spine were also generated.  COMPARISON:  CT head  02/15/2012  FINDINGS: CT HEAD FINDINGS  Generalized atrophy.  Normal ventricular morphology.  No midline shift or mass effect.  Small vessel chronic ischemic changes of deep cerebral white matter.  No intracranial hemorrhage, mass lesion, or acute infarction.  Visualized paranasal sinuses and mastoid air cells clear.  Bones demineralized.  Small RIGHT frontal scalp hematoma.  Atherosclerotic calcifications at carotid siphons.  CT CERVICAL SPINE FINDINGS  Bones demineralized.  Prevertebral soft tissues normal thickness.  Vertebral body heights maintained.  Disc space narrowing C5-C6 and C6-C7 with minimal retrolisthesis at C5-C6.  Multilevel facet degenerative changes.  Visualized skullbase intact.  No acute fracture, additional subluxation or bone destruction.  Bone island C3 vertebral body.  Lung apices clear.  Tiny nonspecific RIGHT thyroid nodules.  IMPRESSION: No acute intracranial abnormalities.  Atrophy with mild small vessel chronic ischemic changes of deep cerebral white matter.  Osseous demineralization with degenerative disc and facet disease changes cervical spine and mild retrolisthesis at C5-C6.  No acute cervical spine abnormalities.   Electronically Signed   By: Lavonia Dana M.D.   On: 11/25/2014 17:24   Ct Cervical Spine Wo Contrast  11/25/2014   CLINICAL DATA:  Fall, RIGHT arm pain and deformity, struck head, no loss of consciousness, history hypertension, breast cancer, colon cancer  EXAM: CT HEAD WITHOUT CONTRAST  CT CERVICAL SPINE WITHOUT CONTRAST  TECHNIQUE: Multidetector CT  imaging of the head and cervical spine was performed following the standard protocol without intravenous contrast. Multiplanar CT image reconstructions of the cervical spine were also generated.  COMPARISON:  CT head 02/15/2012  FINDINGS: CT HEAD FINDINGS  Generalized atrophy.  Normal ventricular morphology.  No midline shift or mass effect.  Small vessel chronic ischemic changes of deep cerebral white matter.  No  intracranial hemorrhage, mass lesion, or acute infarction.  Visualized paranasal sinuses and mastoid air cells clear.  Bones demineralized.  Small RIGHT frontal scalp hematoma.  Atherosclerotic calcifications at carotid siphons.  CT CERVICAL SPINE FINDINGS  Bones demineralized.  Prevertebral soft tissues normal thickness.  Vertebral body heights maintained.  Disc space narrowing C5-C6 and C6-C7 with minimal retrolisthesis at C5-C6.  Multilevel facet degenerative changes.  Visualized skullbase intact.  No acute fracture, additional subluxation or bone destruction.  Bone island C3 vertebral body.  Lung apices clear.  Tiny nonspecific RIGHT thyroid nodules.  IMPRESSION: No acute intracranial abnormalities.  Atrophy with mild small vessel chronic ischemic changes of deep cerebral white matter.  Osseous demineralization with degenerative disc and facet disease changes cervical spine and mild retrolisthesis at C5-C6.  No acute cervical spine abnormalities.   Electronically Signed   By: Lavonia Dana M.D.   On: 11/25/2014 17:24   Ct Humerus Right Wo Contrast  11/26/2014   CLINICAL DATA:  Further characterization of right distal humerus fracture after fall earlier this day.  EXAM: CT OF THE RIGHT HUMERUS WITHOUT CONTRAST  TECHNIQUE: Multidetector CT imaging was performed according to the standard protocol. Multiplanar CT image reconstructions were also generated.  COMPARISON:  Radiographs earlier this day.  FINDINGS: Fracture of the mid distal humeral diaphysis with 1 cm displacement, mild comminution, and minimal osseous overriding. There is no distal extension to the elbow joint. The proximal humerus is intact. The elbow joint is maintained. No evidence of underlying lesion. There is diffuse soft tissue edema about the upper arm at the fracture site. There is no elbow joint effusion.  IMPRESSION: Fracture of the mid distal humeral diaphysis with displacement, mild comminution and minimal osseous overriding. No extension  to the elbow joint.   Electronically Signed   By: Jeb Levering M.D.   On: 11/26/2014 00:55   Ct 3d Recon At Scanner  11/26/2014   CLINICAL DATA:  Abnormal findings on radiological and other examination of musculoskeletal system. Fracture of the right humerus.  EXAM: 3-DIMENSIONAL CT IMAGE RENDERING ON ACQUISITION WORKSTATION  TECHNIQUE: 3-dimensional CT images were rendered by post-processing of the original CT data on an acquisition workstation.  COMPARISON:  Radiographs dated 11/25/2014  FINDINGS: Three-dimensional images were created and better demonstrate the anatomic relationships of the comminuted spiral fracture of the distal right humeral shaft. There is angulation and displacement. The fracture does not extend into the elbow joint.  IMPRESSION: Comminuted angulated displaced spiral fracture of the distal right humeral shaft.   Electronically Signed   By: Lorriane Shire M.D.   On: 11/26/2014 12:27   Dg Humerus Right  11/27/2014   CLINICAL DATA:  Humerus fracture.  Postop.  EXAM: RIGHT HUMERUS - 2+ VIEW  COMPARISON:  11/26/2014  FINDINGS: Sequelae of plate and screw fixation of the obliquely oriented mid to distal humeral shaft fracture are again identified. The fracture is in anatomic alignment. A few small osseous fragments are noted at the distal aspect of the fracture. The shoulder and elbow are grossly located.  IMPRESSION: Internal fixation of right humerus fracture in anatomic alignment.  Electronically Signed   By: Logan Bores   On: 11/27/2014 10:16   Dg Humerus Right  11/26/2014   CLINICAL DATA:  Status post surgery for right humeral fracture.  EXAM: RIGHT HUMERUS - 2+ VIEW  COMPARISON:  11/25/2014  FINDINGS: The patient has undergone interval screw and plate fixation of the obliquely oriented distal humerus fracture. The hardware components and the fracture fragments are in anatomic alignment. There is no complication noted.  IMPRESSION: 1. Status post ORIF of distal humerus fracture.    Electronically Signed   By: Kerby Moors M.D.   On: 11/26/2014 16:30   Dg Humerus Right  11/26/2014   CLINICAL DATA:  ORIF right humerus.  EXAM: DG C-ARM 61-120 MIN; RIGHT HUMERUS - 2+ VIEW  COMPARISON:  CT 11/26/2014.  Plain films 11/25/2014  FLUOROSCOPY TIME:  Radiation Exposure Index (as provided by the fluoroscopic device):  If the device does not provide the exposure index:  Fluoroscopy Time:  8 seconds  Number of Acquired Images:  0  FINDINGS: Four intraoperative spot images demonstrate plate and screw fixation across the comminuted right humeral fracture. Anatomic alignment. No complicating feature.  IMPRESSION: Internal fixation across the right humeral fracture.   Electronically Signed   By: Rolm Baptise M.D.   On: 11/26/2014 15:37   Dg Humerus Right  11/25/2014   CLINICAL DATA:  Fall with significant distal arm pain and deformity  EXAM: RIGHT HUMERUS - 2+ VIEW  COMPARISON:  None.  FINDINGS: There is an oblique comminuted fracture through the mid to distal right humerus. Some posterior and lateral displacement of the distal fracture fragment is noted. No other fractures are seen.  IMPRESSION: Fracture of the mid to distal right humerus   Electronically Signed   By: Inez Catalina M.D.   On: 11/25/2014 17:36   Dg C-arm 1-60 Min  11/26/2014   CLINICAL DATA:  ORIF right humerus.  EXAM: DG C-ARM 61-120 MIN; RIGHT HUMERUS - 2+ VIEW  COMPARISON:  CT 11/26/2014.  Plain films 11/25/2014  FLUOROSCOPY TIME:  Radiation Exposure Index (as provided by the fluoroscopic device):  If the device does not provide the exposure index:  Fluoroscopy Time:  8 seconds  Number of Acquired Images:  0  FINDINGS: Four intraoperative spot images demonstrate plate and screw fixation across the comminuted right humeral fracture. Anatomic alignment. No complicating feature.  IMPRESSION: Internal fixation across the right humeral fracture.   Electronically Signed   By: Rolm Baptise M.D.   On: 11/26/2014 15:37     Microbiology: Recent Results (from the past 240 hour(s))  Surgical pcr screen     Status: None   Collection Time: 11/26/14  6:46 AM  Result Value Ref Range Status   MRSA, PCR NEGATIVE NEGATIVE Final   Staphylococcus aureus NEGATIVE NEGATIVE Final    Comment:        The Xpert SA Assay (FDA approved for NASAL specimens in patients over 68 years of age), is one component of a comprehensive surveillance program.  Test performance has been validated by [facility] for patients greater than or equal to 64 year old. It is not intended to diagnose infection nor to guide or monitor treatment.      Labs: Basic Metabolic Panel:  Recent Labs Lab 11/25/14 1655 11/26/14 0620 11/27/14 0516 11/28/14 0506 11/29/14 0301  NA 133* 134* 132* 133* 135  K 4.2 3.4* 3.6 3.2* 3.4*  CL 99* 103 98* 100* 104  CO2 $Re'23 23 24 24 24  'Btq$ GLUCOSE  136* 111* 86 102* 99  BUN 27* 21* 21* 23* 23*  CREATININE 1.34* 1.11* 1.34* 1.60* 1.40*  CALCIUM 9.7 7.7* 8.3* 8.1* 8.4*   Liver Function Tests:  Recent Labs Lab 11/26/14 0620  AST 19  ALT 15  ALKPHOS 66  BILITOT 0.8  PROT 4.8*  ALBUMIN 2.8*   No results for input(s): LIPASE, AMYLASE in the last 168 hours. No results for input(s): AMMONIA in the last 168 hours. CBC:  Recent Labs Lab 11/25/14 1801 11/26/14 0620 11/26/14 1700 11/27/14 0516 11/28/14 0506 11/29/14 0301  WBC 14.7* 8.3 11.8* 10.1 8.3 7.1  NEUTROABS 13.2*  --   --   --   --   --   HGB 11.6* 9.5* 10.8* 9.4* 8.6* 9.3*  HCT 32.8* 26.9* 30.3* 26.5* 24.2* 26.5*  MCV 88.6 89.1 89.4 88.6 90.3 89.5  PLT 265 230 231 223 209 214   Cardiac Enzymes: No results for input(s): CKTOTAL, CKMB, CKMBINDEX, TROPONINI in the last 168 hours. BNP: BNP (last 3 results) No results for input(s): BNP in the last 8760 hours.  ProBNP (last 3 results) No results for input(s): PROBNP in the last 8760 hours.  CBG:  Recent Labs Lab 11/26/14 0623 11/27/14 0818 11/28/14 0810 11/29/14 0607   GLUCAP 118* 84 112* 90       Signed:  Brooks Stotz A  [facility] 11/29/2014, 10:13 AM

## 2014-11-29 NOTE — Progress Notes (Signed)
Occupational Therapy Treatment Patient Details Name: Stephanie Martinez MRN: [id number] DOB: 08-04-1921 Today's Date: 11/29/2014    History of present illness Pt is a 81 y.o. Female s/p R ORIF for humerus fx sustained in ground level fall.    OT comments  Pt. And dtr. Able to complete R UE rom and exercises with good technique.  Pt. Tolerated well.  Hand out and detailed explanation provided.  D/c set for later today will alert OTR/L to sign off  Follow Up Recommendations  Home health OT;Supervision/Assistance - 24 hour    Equipment Recommendations  3 in 1 bedside comode    Recommendations for Other Services      Precautions / Restrictions Precautions Precautions: Fall Type of Shoulder Precautions: Per MD note: Pendulums (as tolerated); gentle passive FF and ABD; NO active abd of shoulder; A/AROM of elbow,wrist, hand as tolerated Shoulder Interventions: Shoulder sling/immobilizer Precaution Comments: reinforced precautions Required Braces or Orthoses: Sling Restrictions RUE Weight Bearing: Non weight bearing       Mobility Bed Mobility                  Transfers                      Balance                                   ADL                                                Vision                     Perception     Praxis      Cognition                             Extremity/Trunk Assessment               Exercises Shoulder Exercises Shoulder Flexion: AAROM;Right;10 reps;Supine Elbow Flexion: AAROM;Right;10 reps;Supine Elbow Extension: AAROM;Right;10 reps;Supine Wrist Flexion: AROM;Right;10 reps;Supine Wrist Extension: AROM;Right;10 reps;Supine Digit Composite Flexion: AROM;Right;10 reps;Supine Composite Extension: AROM;Right;10 reps;Supine Donning/doffing sling/immobilizer: Caregiver independent with task Correct positioning of sling/immobilizer: Caregiver independent with  task   Shoulder Instructions Shoulder Instructions Donning/doffing sling/immobilizer: Caregiver independent with task Correct positioning of sling/immobilizer: Caregiver independent with task     General Comments      Pertinent Vitals/ Pain          Home Living                                          Prior Functioning/Environment              Frequency Min 2X/week     Progress Toward Goals  OT Goals(current goals can now be found in the care plan section)  Progress towards OT goals: Progressing toward goals     Plan Discharge plan remains appropriate    Co-evaluation                 End of Session     Activity Tolerance Patient tolerated treatment well  Patient Left in bed;with call bell/phone within reach;with family/visitor present   Nurse Communication          Time: 7159-5396 OT Time Calculation (min): 44 min  Charges: OT General Charges $OT Visit: 1 Procedure OT Treatments $Therapeutic Exercise: 38-52 mins  Janice Coffin, COTA/L 11/29/2014, 10:15 AM

## 2014-11-30 LAB — VITAMIN D 1,25 DIHYDROXY
Vitamin D 1, 25 (OH)2 Total: 18 pg/mL
Vitamin D3 1, 25 (OH)2: 18 pg/mL

## 2014-12-02 ENCOUNTER — Telehealth: Payer: Self-pay

## 2014-12-02 NOTE — Telephone Encounter (Signed)
Home health nurse stated that she was out at patient home today and reports a high BP of 184/96. She also wanted to report a BP of 190/90 on this past Saturday. They would like to know MD advisement, she has not symptoms. Thanks

## 2014-12-02 NOTE — Telephone Encounter (Signed)
Restart meds taken prior to the hosp admission

## 2014-12-03 NOTE — Telephone Encounter (Signed)
Home health nurse informed

## 2014-12-05 ENCOUNTER — Ambulatory Visit (INDEPENDENT_AMBULATORY_CARE_PROVIDER_SITE_OTHER): Payer: Medicare Other | Admitting: Internal Medicine

## 2014-12-05 ENCOUNTER — Other Ambulatory Visit (INDEPENDENT_AMBULATORY_CARE_PROVIDER_SITE_OTHER): Payer: Medicare Other

## 2014-12-05 ENCOUNTER — Encounter: Payer: Self-pay | Admitting: Internal Medicine

## 2014-12-05 VITALS — BP 124/86 | HR 91 | Temp 98.0°F | Resp 16

## 2014-12-05 DIAGNOSIS — D62 Acute posthemorrhagic anemia: Secondary | ICD-10-CM

## 2014-12-05 DIAGNOSIS — I10 Essential (primary) hypertension: Secondary | ICD-10-CM

## 2014-12-05 DIAGNOSIS — E559 Vitamin D deficiency, unspecified: Secondary | ICD-10-CM

## 2014-12-05 LAB — IBC PANEL
IRON: 125 ug/dL (ref 42–145)
SATURATION RATIOS: 45.1 % (ref 20.0–50.0)
TRANSFERRIN: 198 mg/dL — AB (ref 212.0–360.0)

## 2014-12-05 LAB — CBC WITH DIFFERENTIAL/PLATELET
BASOS ABS: 0.1 10*3/uL (ref 0.0–0.1)
BASOS PCT: 0.6 % (ref 0.0–3.0)
EOS ABS: 0.5 10*3/uL (ref 0.0–0.7)
Eosinophils Relative: 5.9 % — ABNORMAL HIGH (ref 0.0–5.0)
HEMATOCRIT: 32.1 % — AB (ref 36.0–46.0)
HEMOGLOBIN: 10.7 g/dL — AB (ref 12.0–15.0)
LYMPHS ABS: 1.3 10*3/uL (ref 0.7–4.0)
Lymphocytes Relative: 14.7 % (ref 12.0–46.0)
MCHC: 33.3 g/dL (ref 30.0–36.0)
MCV: 95 fl (ref 78.0–100.0)
Monocytes Absolute: 0.8 10*3/uL (ref 0.1–1.0)
Monocytes Relative: 9 % (ref 3.0–12.0)
NEUTROS ABS: 6.1 10*3/uL (ref 1.4–7.7)
Neutrophils Relative %: 69.8 % (ref 43.0–77.0)
Platelets: 376 10*3/uL (ref 150.0–400.0)
RBC: 3.38 Mil/uL — AB (ref 3.87–5.11)
RDW: 13.3 % (ref 11.5–15.5)
WBC: 8.8 10*3/uL (ref 4.0–10.5)

## 2014-12-05 LAB — FERRITIN: Ferritin: 128.8 ng/mL (ref 10.0–291.0)

## 2014-12-05 MED ORDER — CHOLECALCIFEROL 1.25 MG (50000 UT) PO TABS: 1.0000 | ORAL_TABLET | ORAL | Status: DC

## 2014-12-05 NOTE — Progress Notes (Signed)
Subjective:  Patient ID: Stephanie Martinez, female    DOB: Dec 18, 1921  Age: 81 y.o. MRN: [id number]  CC: Hypertension and Anemia   HPI Stephanie Martinez presents for a hosp f/up, she is S/P ORIF of right humerus fracture caused by a fall, she had a post-op anemia that required transfusion with 1 unit of pRBC's. She is doing well and offers no new complaints today.  Outpatient Prescriptions Prior to Visit  Medication Sig Dispense Refill  . acetaminophen (TYLENOL) 500 MG tablet Take 2 tablets (1,000 mg total) by mouth every 6 (six) hours. 90 tablet 1  . amLODipine (NORVASC) 2.5 MG tablet Take 1 tablet (2.5 mg total) by mouth daily. 30 tablet 0  . aspirin 81 MG tablet Take 81 mg by mouth daily.    . calcium citrate-vitamin D (CITRACAL+D) 315-200 MG-UNIT per tablet Take 1 tablet by mouth daily.    . carvedilol (COREG) 6.25 MG tablet Take 1 tablet (6.25 mg total) by mouth 2 (two) times daily with a meal. 60 tablet 5  . docusate sodium (COLACE) 100 MG capsule Take 1 capsule (100 mg total) by mouth 2 (two) times daily. 30 capsule 1  . GLUCOSAMINE HCL PO Take 1,000 mg by mouth daily.    Marland Kitchen latanoprost (XALATAN) 0.005 % ophthalmic solution Place 1 drop into both eyes at bedtime.     . LUTEIN PO Take 25 mg by mouth daily.    Marland Kitchen omeprazole (PRILOSEC) 20 MG capsule TAKE 1 CAPSULE BY MOUTH DAILY 90 capsule 3  . polyethylene glycol (MIRALAX / GLYCOLAX) packet Take 17 g by mouth daily. 14 each 0  . traMADol (ULTRAM) 50 MG tablet Take 1-2 tablets (50-100 mg total) by mouth every 6 (six) hours as needed for severe pain. 30 tablet 1  . Umeclidinium Bromide (INCRUSE ELLIPTA) 62.5 MCG/INH AEPB Inhale 1 puff into the lungs daily. 30 each 11  . vitamin C (ASCORBIC ACID) 500 MG tablet Take 500 mg by mouth daily.     No facility-administered medications prior to visit.    ROS Review of Systems  Constitutional: Negative.  Negative for fever, chills, diaphoresis, appetite change and fatigue.  HENT:  Negative.   Eyes: Negative.   Respiratory: Negative.  Negative for cough, choking, chest tightness, shortness of breath and stridor.   Cardiovascular: Negative.  Negative for chest pain, palpitations and leg swelling.  Gastrointestinal: Negative.  Negative for nausea, vomiting, abdominal pain, diarrhea, constipation and blood in stool.  Endocrine: Negative.   Genitourinary: Negative.  Negative for dysuria, urgency, frequency, hematuria, flank pain, decreased urine volume and difficulty urinating.  Musculoskeletal: Positive for arthralgias. Negative for myalgias, back pain and joint swelling.  Skin: Negative.   Allergic/Immunologic: Negative.   Neurological: Negative.  Negative for dizziness, tremors, light-headedness and numbness.  Hematological: Negative.  Negative for adenopathy. Does not bruise/bleed easily.  Psychiatric/Behavioral: Negative.     Objective:  BP 124/86 mmHg  Pulse 91  Temp(Src) 98 F (36.7 C) (Oral)  Resp 16  Wt   SpO2 93%  BP Readings from Last 3 Encounters:  12/05/14 124/86  11/29/14 155/75  11/14/14 160/102    Wt Readings from Last 3 Encounters:  11/14/14 133 lb (60.328 kg)  01/09/14 128 lb (58.06 kg)  11/21/13 129 lb (58.514 kg)    Physical Exam  Constitutional: She is oriented to person, place, and time. She appears well-developed and well-nourished. No distress.  HENT:  Head: Normocephalic and atraumatic.  Mouth/Throat: Oropharynx is clear and moist. No  oropharyngeal exudate.  Eyes: Conjunctivae are normal. Right eye exhibits no discharge. Left eye exhibits no discharge. No scleral icterus.  Neck: Normal range of motion. Neck supple. No JVD present. No tracheal deviation present. No thyromegaly present.  Cardiovascular: Normal rate, regular rhythm, normal heart sounds and intact distal pulses.  Exam reveals no gallop and no friction rub.   No murmur heard. Pulmonary/Chest: Effort normal and breath sounds normal. No stridor. No respiratory  distress. She has no wheezes. She has no rales. She exhibits no tenderness.  Abdominal: Soft. Bowel sounds are normal. She exhibits no distension and no mass. There is no tenderness. There is no rebound and no guarding.  Musculoskeletal: Normal range of motion. She exhibits no edema or tenderness.  Lymphadenopathy:    She has no cervical adenopathy.  Neurological: She is oriented to person, place, and time.  Skin: Skin is warm and dry. No rash noted. She is not diaphoretic. No erythema. No pallor.  Vitals reviewed.   Lab Results  Component Value Date   WBC 8.8 12/05/2014   HGB 10.7* 12/05/2014   HCT 32.1* 12/05/2014   PLT 376.0 12/05/2014   GLUCOSE 99 11/29/2014   CHOL 155 11/26/2014   TRIG 102 11/26/2014   HDL 33* 11/26/2014   LDLDIRECT 124.0 11/14/2014   LDLCALC 102* 11/26/2014   ALT 15 11/26/2014   AST 19 11/26/2014   NA 135 11/29/2014   K 3.4* 11/29/2014   CL 104 11/29/2014   CREATININE 1.40* 11/29/2014   BUN 23* 11/29/2014   CO2 24 11/29/2014   TSH 2.05 11/14/2014   INR 1.11 11/25/2014   HGBA1C 5.7 09/18/2012    Dg Chest 2 View  11/25/2014   CLINICAL DATA:  Patient fell today. Tenderness over the RIGHT lower chest. Distal humerus fracture.  EXAM: CHEST  2 VIEW  COMPARISON:  11/14/2014.  FINDINGS: Cardiomegaly. Aortic atherosclerosis. Bibasilar scarring or atelectasis. No overt failure or infiltrates. The bones are demineralized. I do not see a definite rib fracture. There is no pneumothorax.  IMPRESSION: Stable exam. Cardiomegaly with chronic changes. No definite rib fracture or RIGHT pleural effusion.   Electronically Signed   By: Rolla Flatten M.D.   On: 11/25/2014 21:08   Ct Head Wo Contrast  11/25/2014   CLINICAL DATA:  Fall, RIGHT arm pain and deformity, struck head, no loss of consciousness, history hypertension, breast cancer, colon cancer  EXAM: CT HEAD WITHOUT CONTRAST  CT CERVICAL SPINE WITHOUT CONTRAST  TECHNIQUE: Multidetector CT imaging of the head and cervical  spine was performed following the standard protocol without intravenous contrast. Multiplanar CT image reconstructions of the cervical spine were also generated.  COMPARISON:  CT head 02/15/2012  FINDINGS: CT HEAD FINDINGS  Generalized atrophy.  Normal ventricular morphology.  No midline shift or mass effect.  Small vessel chronic ischemic changes of deep cerebral white matter.  No intracranial hemorrhage, mass lesion, or acute infarction.  Visualized paranasal sinuses and mastoid air cells clear.  Bones demineralized.  Small RIGHT frontal scalp hematoma.  Atherosclerotic calcifications at carotid siphons.  CT CERVICAL SPINE FINDINGS  Bones demineralized.  Prevertebral soft tissues normal thickness.  Vertebral body heights maintained.  Disc space narrowing C5-C6 and C6-C7 with minimal retrolisthesis at C5-C6.  Multilevel facet degenerative changes.  Visualized skullbase intact.  No acute fracture, additional subluxation or bone destruction.  Bone island C3 vertebral body.  Lung apices clear.  Tiny nonspecific RIGHT thyroid nodules.  IMPRESSION: No acute intracranial abnormalities.  Atrophy with mild small vessel  chronic ischemic changes of deep cerebral white matter.  Osseous demineralization with degenerative disc and facet disease changes cervical spine and mild retrolisthesis at C5-C6.  No acute cervical spine abnormalities.   Electronically Signed   By: Lavonia Dana M.D.   On: 11/25/2014 17:24   Ct Cervical Spine Wo Contrast  11/25/2014   CLINICAL DATA:  Fall, RIGHT arm pain and deformity, struck head, no loss of consciousness, history hypertension, breast cancer, colon cancer  EXAM: CT HEAD WITHOUT CONTRAST  CT CERVICAL SPINE WITHOUT CONTRAST  TECHNIQUE: Multidetector CT imaging of the head and cervical spine was performed following the standard protocol without intravenous contrast. Multiplanar CT image reconstructions of the cervical spine were also generated.  COMPARISON:  CT head 02/15/2012  FINDINGS: CT  HEAD FINDINGS  Generalized atrophy.  Normal ventricular morphology.  No midline shift or mass effect.  Small vessel chronic ischemic changes of deep cerebral white matter.  No intracranial hemorrhage, mass lesion, or acute infarction.  Visualized paranasal sinuses and mastoid air cells clear.  Bones demineralized.  Small RIGHT frontal scalp hematoma.  Atherosclerotic calcifications at carotid siphons.  CT CERVICAL SPINE FINDINGS  Bones demineralized.  Prevertebral soft tissues normal thickness.  Vertebral body heights maintained.  Disc space narrowing C5-C6 and C6-C7 with minimal retrolisthesis at C5-C6.  Multilevel facet degenerative changes.  Visualized skullbase intact.  No acute fracture, additional subluxation or bone destruction.  Bone island C3 vertebral body.  Lung apices clear.  Tiny nonspecific RIGHT thyroid nodules.  IMPRESSION: No acute intracranial abnormalities.  Atrophy with mild small vessel chronic ischemic changes of deep cerebral white matter.  Osseous demineralization with degenerative disc and facet disease changes cervical spine and mild retrolisthesis at C5-C6.  No acute cervical spine abnormalities.   Electronically Signed   By: Lavonia Dana M.D.   On: 11/25/2014 17:24   Ct Humerus Right Wo Contrast  11/26/2014   CLINICAL DATA:  Further characterization of right distal humerus fracture after fall earlier this day.  EXAM: CT OF THE RIGHT HUMERUS WITHOUT CONTRAST  TECHNIQUE: Multidetector CT imaging was performed according to the standard protocol. Multiplanar CT image reconstructions were also generated.  COMPARISON:  Radiographs earlier this day.  FINDINGS: Fracture of the mid distal humeral diaphysis with 1 cm displacement, mild comminution, and minimal osseous overriding. There is no distal extension to the elbow joint. The proximal humerus is intact. The elbow joint is maintained. No evidence of underlying lesion. There is diffuse soft tissue edema about the upper arm at the fracture  site. There is no elbow joint effusion.  IMPRESSION: Fracture of the mid distal humeral diaphysis with displacement, mild comminution and minimal osseous overriding. No extension to the elbow joint.   Electronically Signed   By: Jeb Levering M.D.   On: 11/26/2014 00:55   Ct 3d Recon At Scanner  11/26/2014   CLINICAL DATA:  Abnormal findings on radiological and other examination of musculoskeletal system. Fracture of the right humerus.  EXAM: 3-DIMENSIONAL CT IMAGE RENDERING ON ACQUISITION WORKSTATION  TECHNIQUE: 3-dimensional CT images were rendered by post-processing of the original CT data on an acquisition workstation.  COMPARISON:  Radiographs dated 11/25/2014  FINDINGS: Three-dimensional images were created and better demonstrate the anatomic relationships of the comminuted spiral fracture of the distal right humeral shaft. There is angulation and displacement. The fracture does not extend into the elbow joint.  IMPRESSION: Comminuted angulated displaced spiral fracture of the distal right humeral shaft.   Electronically Signed   By: Jeneen Rinks  Maxwell M.D.   On: 11/26/2014 12:27   Dg Humerus Right  11/26/2014   CLINICAL DATA:  Status post surgery for right humeral fracture.  EXAM: RIGHT HUMERUS - 2+ VIEW  COMPARISON:  11/25/2014  FINDINGS: The patient has undergone interval screw and plate fixation of the obliquely oriented distal humerus fracture. The hardware components and the fracture fragments are in anatomic alignment. There is no complication noted.  IMPRESSION: 1. Status post ORIF of distal humerus fracture.   Electronically Signed   By: Kerby Moors M.D.   On: 11/26/2014 16:30   Dg Humerus Right  11/26/2014   CLINICAL DATA:  ORIF right humerus.  EXAM: DG C-ARM 61-120 MIN; RIGHT HUMERUS - 2+ VIEW  COMPARISON:  CT 11/26/2014.  Plain films 11/25/2014  FLUOROSCOPY TIME:  Radiation Exposure Index (as provided by the fluoroscopic device):  If the device does not provide the exposure index:   Fluoroscopy Time:  8 seconds  Number of Acquired Images:  0  FINDINGS: Four intraoperative spot images demonstrate plate and screw fixation across the comminuted right humeral fracture. Anatomic alignment. No complicating feature.  IMPRESSION: Internal fixation across the right humeral fracture.   Electronically Signed   By: Rolm Baptise M.D.   On: 11/26/2014 15:37   Dg Humerus Right  11/25/2014   CLINICAL DATA:  Fall with significant distal arm pain and deformity  EXAM: RIGHT HUMERUS - 2+ VIEW  COMPARISON:  None.  FINDINGS: There is an oblique comminuted fracture through the mid to distal right humerus. Some posterior and lateral displacement of the distal fracture fragment is noted. No other fractures are seen.  IMPRESSION: Fracture of the mid to distal right humerus   Electronically Signed   By: Inez Catalina M.D.   On: 11/25/2014 17:36   Dg C-arm 1-60 Min  11/26/2014   CLINICAL DATA:  ORIF right humerus.  EXAM: DG C-ARM 61-120 MIN; RIGHT HUMERUS - 2+ VIEW  COMPARISON:  CT 11/26/2014.  Plain films 11/25/2014  FLUOROSCOPY TIME:  Radiation Exposure Index (as provided by the fluoroscopic device):  If the device does not provide the exposure index:  Fluoroscopy Time:  8 seconds  Number of Acquired Images:  0  FINDINGS: Four intraoperative spot images demonstrate plate and screw fixation across the comminuted right humeral fracture. Anatomic alignment. No complicating feature.  IMPRESSION: Internal fixation across the right humeral fracture.   Electronically Signed   By: Rolm Baptise M.D.   On: 11/26/2014 15:37    Assessment & Plan:   Stephanie Martinez was seen today for hypertension and anemia.  Diagnoses and all orders for this visit:  Vitamin D deficiency - start Vit D replacement Orders: -     Cholecalciferol 50000 UNITS TABS; Take 1 tablet by mouth once a week.  Essential hypertension, benign - her BP is well controlled  Acute blood loss anemia - this is stable, iron level is normal Orders: -     CBC  with Differential/Platelet; Future -     IBC panel; Future -     Ferritin; Future   I am having Stephanie Martinez start on Cholecalciferol. I am also having her maintain her calcium citrate-vitamin D, latanoprost, LUTEIN PO, aspirin, GLUCOSAMINE HCL PO, vitamin C, omeprazole, Umeclidinium Bromide, carvedilol, acetaminophen, docusate sodium, traMADol, polyethylene glycol, and amLODipine.  Meds ordered this encounter  Medications  . Cholecalciferol 50000 UNITS TABS    Sig: Take 1 tablet by mouth once a week.    Dispense:  12 tablet  Refill:  3     Follow-up: Return in about 4 months (around 04/06/2015).  Scarlette Calico, MD

## 2014-12-05 NOTE — Progress Notes (Signed)
Pre visit review using our clinic review tool, if applicable. No additional management support is needed unless otherwise documented below in the visit note. 

## 2014-12-05 NOTE — Patient Instructions (Signed)

## 2014-12-05 NOTE — Care Management (Signed)
Utilization review completed by Tarrence Enck N. Severin Bou, RN BSN 

## 2014-12-08 ENCOUNTER — Encounter: Payer: Self-pay | Admitting: Internal Medicine

## 2014-12-19 ENCOUNTER — Other Ambulatory Visit: Payer: Self-pay | Admitting: Internal Medicine

## 2015-01-06 ENCOUNTER — Other Ambulatory Visit: Payer: Self-pay | Admitting: Internal Medicine

## 2015-01-10 ENCOUNTER — Telehealth: Payer: Self-pay | Admitting: Internal Medicine

## 2015-01-10 DIAGNOSIS — J441 Chronic obstructive pulmonary disease with (acute) exacerbation: Secondary | ICD-10-CM

## 2015-01-10 NOTE — Telephone Encounter (Signed)
Patient was given samples of an inhaler on her last visit. She doesn't know the name of it, but it is round, white with a blue top. She is out and would like a prescription to be sent to [facility] on [address] Minette Brine is patients daughter Izora Gala at [phone]

## 2015-01-13 MED ORDER — UMECLIDINIUM BROMIDE 62.5 MCG/INH IN AEPB: 1.0000 | INHALATION_SPRAY | Freq: Every day | RESPIRATORY_TRACT | Status: DC

## 2015-01-13 NOTE — Telephone Encounter (Signed)
done

## 2015-01-13 NOTE — Telephone Encounter (Signed)
Daughter notified,lmovm.

## 2015-01-15 ENCOUNTER — Telehealth: Payer: Self-pay

## 2015-01-15 NOTE — Telephone Encounter (Signed)
Received pharmacy rejection stating that insurance will not cover incruse ellipta  without a prior authorization. PA submitted to insurance via covermymeds, approval now pending their decision.

## 2015-01-20 ENCOUNTER — Telehealth: Payer: Self-pay | Admitting: Internal Medicine

## 2015-01-20 NOTE — Telephone Encounter (Signed)
Pt daughter called in and has question about pt meds and doses that she should be taking.  Can you give her a call as soon has you get a chance.  She said she has called a couple times but I didn't see any notes regarding that ?

## 2015-01-21 NOTE — Telephone Encounter (Signed)
Patient's daughter advised.  

## 2015-01-21 NOTE — Telephone Encounter (Signed)
Patients carvidelol was increased at her last hospital stay--blood pressure was running higher due to pain (is what daughter is thinking)--patient is taking 2 different doses of carvedolol along with 1 tab amlodipine daily---should she continue taking both doses of carvedilol along with amlodipine now---daughter could not give any recent bp readings----please advise, thanks

## 2015-01-21 NOTE — Telephone Encounter (Signed)
Yes, stay on the same doses of both medications.

## 2015-02-04 ENCOUNTER — Other Ambulatory Visit: Payer: Self-pay | Admitting: Internal Medicine

## 2015-02-04 DIAGNOSIS — J441 Chronic obstructive pulmonary disease with (acute) exacerbation: Secondary | ICD-10-CM

## 2015-02-04 MED ORDER — ACLIDINIUM BROMIDE 400 MCG/ACT IN AEPB: 1.0000 | INHALATION_SPRAY | Freq: Two times a day (BID) | RESPIRATORY_TRACT | Status: DC

## 2015-02-04 NOTE — Telephone Encounter (Signed)
Receive Pa request today. Not sure what previously was done. KEY [id number] awaiting response from ins.

## 2015-03-05 ENCOUNTER — Ambulatory Visit: Payer: Medicare Other | Admitting: Internal Medicine

## 2015-03-10 ENCOUNTER — Ambulatory Visit: Payer: Medicare Other | Admitting: Internal Medicine

## 2015-03-11 ENCOUNTER — Encounter: Payer: Self-pay | Admitting: Internal Medicine

## 2015-03-11 ENCOUNTER — Ambulatory Visit (INDEPENDENT_AMBULATORY_CARE_PROVIDER_SITE_OTHER): Payer: Medicare Other | Admitting: Internal Medicine

## 2015-03-11 VITALS — BP 120/82 | HR 113 | Temp 97.8°F | Resp 16 | Ht 62.0 in | Wt 125.0 lb

## 2015-03-11 DIAGNOSIS — J449 Chronic obstructive pulmonary disease, unspecified: Secondary | ICD-10-CM | POA: Diagnosis not present

## 2015-03-11 DIAGNOSIS — IMO0001 Reserved for inherently not codable concepts without codable children: Secondary | ICD-10-CM

## 2015-03-11 DIAGNOSIS — I10 Essential (primary) hypertension: Secondary | ICD-10-CM | POA: Diagnosis not present

## 2015-03-11 MED ORDER — FLUTICASONE FUROATE-VILANTEROL 100-25 MCG/INH IN AEPB: 1.0000 | INHALATION_SPRAY | Freq: Every day | RESPIRATORY_TRACT | Status: DC

## 2015-03-11 MED ORDER — CARVEDILOL 6.25 MG PO TABS: 6.2500 mg | ORAL_TABLET | Freq: Two times a day (BID) | ORAL | Status: DC

## 2015-03-11 NOTE — Progress Notes (Signed)
Subjective:  Patient ID: Stephanie Martinez, female    DOB: [birth year]  Age: 81 y.o. MRN: [id number]  CC: Cough and Hypertension   HPI Stephanie Martinez presents for chronic, recurrent cough. It has worsened recently because she can't get her insurance company to pay for inhalers like Tudorza. She tried to use the Spiriva dispenser but she had too much trouble with the capsule and the device. She tells me that over the years she thinks the best medication that ever controlled her coughing was Advair Diskus. She stopped taking it at one point because her insurance company stopped covering it. Her cough is intermittent throughout the day and night and she does not bring up any phlegm.  Outpatient Prescriptions Prior to Visit  Medication Sig Dispense Refill  . acetaminophen (TYLENOL) 500 MG tablet Take 2 tablets (1,000 mg total) by mouth every 6 (six) hours. 90 tablet 1  . aspirin 81 MG tablet Take 81 mg by mouth daily.    . Cholecalciferol 50000 UNITS TABS Take 1 tablet by mouth once a week. 12 tablet 3  . GLUCOSAMINE HCL PO Take 1,000 mg by mouth daily.    Marland Kitchen latanoprost (XALATAN) 0.005 % ophthalmic solution Place 1 drop into both eyes at bedtime.     . LUTEIN PO Take 25 mg by mouth daily.    . vitamin C (ASCORBIC ACID) 500 MG tablet Take 500 mg by mouth daily.    . Aclidinium Bromide (TUDORZA PRESSAIR) 400 MCG/ACT AEPB Inhale 1 puff into the lungs 2 (two) times daily. 60 each 11  . amLODipine (NORVASC) 2.5 MG tablet Take 1 tablet (2.5 mg total) by mouth daily. 30 tablet 0  . calcium citrate-vitamin D (CITRACAL+D) 315-200 MG-UNIT per tablet Take 1 tablet by mouth daily.    . carvedilol (COREG) 3.125 MG tablet TAKE 1 TABLET BY MOUTH TWICE DAILY WITH A MEAL 180 tablet 3  . carvedilol (COREG) 6.25 MG tablet Take 1 tablet (6.25 mg total) by mouth 2 (two) times daily with a meal. 60 tablet 5  . docusate sodium (COLACE) 100 MG capsule Take 1 capsule (100 mg total) by mouth 2 (two) times daily. 30  capsule 1  . omeprazole (PRILOSEC) 20 MG capsule TAKE ONE CAPSULE BY MOUTH DAILY 90 capsule 3  . omeprazole (PRILOSEC) 20 MG capsule TAKE ONE CAPSULE BY MOUTH DAILY 90 capsule 3  . polyethylene glycol (MIRALAX / GLYCOLAX) packet Take 17 g by mouth daily. 14 each 0  . traMADol (ULTRAM) 50 MG tablet Take 1-2 tablets (50-100 mg total) by mouth every 6 (six) hours as needed for severe pain. 30 tablet 1   No facility-administered medications prior to visit.    ROS Review of Systems  Constitutional: Negative.  Negative for fever, chills, diaphoresis, appetite change and fatigue.  HENT: Negative.  Negative for congestion, nosebleeds, postnasal drip, rhinorrhea, sinus pressure, trouble swallowing and voice change.   Eyes: Negative.   Respiratory: Positive for cough. Negative for apnea, choking, chest tightness, shortness of breath, wheezing and stridor.   Cardiovascular: Negative.  Negative for chest pain, palpitations and leg swelling.  Gastrointestinal: Negative.  Negative for nausea, vomiting, abdominal pain, diarrhea, constipation and blood in stool.  Endocrine: Negative.   Genitourinary: Negative.   Musculoskeletal: Negative.  Negative for myalgias, back pain, joint swelling and arthralgias.  Skin: Negative.  Negative for rash.  Allergic/Immunologic: Negative.   Neurological: Negative.  Negative for dizziness, syncope, speech difficulty, weakness, light-headedness and headaches.  Hematological: Negative.  Negative  for adenopathy. Does not bruise/bleed easily.  Psychiatric/Behavioral: Negative.     Objective:  BP 120/82 mmHg  Pulse 113  Temp(Src) 97.8 F (36.6 C) (Oral)  Resp 16  Ht 5\' 2"  (1.575 m)  Wt 125 lb (56.7 kg)  BMI 22.86 kg/m2  SpO2 96%  BP Readings from Last 3 Encounters:  03/11/15 120/82  12/05/14 124/86  11/29/14 155/75    Wt Readings from Last 3 Encounters:  03/11/15 125 lb (56.7 kg)  11/14/14 133 lb (60.328 kg)  01/09/14 128 lb (58.06 kg)    Physical Exam    Constitutional: She is oriented to person, place, and time. No distress.  HENT:  Mouth/Throat: Oropharynx is clear and moist. No oropharyngeal exudate.  Eyes: Conjunctivae are normal. Right eye exhibits no discharge. Left eye exhibits no discharge. No scleral icterus.  Neck: Normal range of motion. Neck supple. No JVD present. No tracheal deviation present. No thyromegaly present.  Cardiovascular: Normal rate, regular rhythm, normal heart sounds and intact distal pulses.  Exam reveals no gallop and no friction rub.   No murmur heard. Pulmonary/Chest: Effort normal and breath sounds normal. No stridor. No respiratory distress. She has no wheezes. She has no rales. She exhibits no tenderness.  Abdominal: Soft. Bowel sounds are normal. She exhibits no distension and no mass. There is no tenderness. There is no rebound and no guarding.  Musculoskeletal: Normal range of motion. She exhibits no edema or tenderness.  Lymphadenopathy:    She has no cervical adenopathy.  Neurological: She is oriented to person, place, and time.  Skin: Skin is warm and dry. No rash noted. She is not diaphoretic. No erythema. No pallor.  Psychiatric: She has a normal mood and affect. Her behavior is normal. Judgment and thought content normal.  Vitals reviewed.   Lab Results  Component Value Date   WBC 8.8 12/05/2014   HGB 10.7* 12/05/2014   HCT 32.1* 12/05/2014   PLT 376.0 12/05/2014   GLUCOSE 99 11/29/2014   CHOL 155 11/26/2014   TRIG 102 11/26/2014   HDL 33* 11/26/2014   LDLDIRECT 124.0 11/14/2014   LDLCALC 102* 11/26/2014   ALT 15 11/26/2014   AST 19 11/26/2014   NA 135 11/29/2014   K 3.4* 11/29/2014   CL 104 11/29/2014   CREATININE 1.40* 11/29/2014   BUN 23* 11/29/2014   CO2 24 11/29/2014   TSH 2.05 11/14/2014   INR 1.11 11/25/2014   HGBA1C 5.7 09/18/2012    Dg Chest 2 View  11/25/2014   CLINICAL DATA:  Patient fell today. Tenderness over the RIGHT lower chest. Distal humerus fracture.  EXAM:  CHEST  2 VIEW  COMPARISON:  11/14/2014.  FINDINGS: Cardiomegaly. Aortic atherosclerosis. Bibasilar scarring or atelectasis. No overt failure or infiltrates. The bones are demineralized. I do not see a definite rib fracture. There is no pneumothorax.  IMPRESSION: Stable exam. Cardiomegaly with chronic changes. No definite rib fracture or RIGHT pleural effusion.   Electronically Signed   By: Rolla Flatten M.D.   On: 11/25/2014 21:08   Ct Head Wo Contrast  11/25/2014   CLINICAL DATA:  Fall, RIGHT arm pain and deformity, struck head, no loss of consciousness, history hypertension, breast cancer, colon cancer  EXAM: CT HEAD WITHOUT CONTRAST  CT CERVICAL SPINE WITHOUT CONTRAST  TECHNIQUE: Multidetector CT imaging of the head and cervical spine was performed following the standard protocol without intravenous contrast. Multiplanar CT image reconstructions of the cervical spine were also generated.  COMPARISON:  CT head 02/15/2012  FINDINGS: CT HEAD FINDINGS  Generalized atrophy.  Normal ventricular morphology.  No midline shift or mass effect.  Small vessel chronic ischemic changes of deep cerebral white matter.  No intracranial hemorrhage, mass lesion, or acute infarction.  Visualized paranasal sinuses and mastoid air cells clear.  Bones demineralized.  Small RIGHT frontal scalp hematoma.  Atherosclerotic calcifications at carotid siphons.  CT CERVICAL SPINE FINDINGS  Bones demineralized.  Prevertebral soft tissues normal thickness.  Vertebral body heights maintained.  Disc space narrowing C5-C6 and C6-C7 with minimal retrolisthesis at C5-C6.  Multilevel facet degenerative changes.  Visualized skullbase intact.  No acute fracture, additional subluxation or bone destruction.  Bone island C3 vertebral body.  Lung apices clear.  Tiny nonspecific RIGHT thyroid nodules.  IMPRESSION: No acute intracranial abnormalities.  Atrophy with mild small vessel chronic ischemic changes of deep cerebral white matter.  Osseous  demineralization with degenerative disc and facet disease changes cervical spine and mild retrolisthesis at C5-C6.  No acute cervical spine abnormalities.   Electronically Signed   By: Lavonia Dana M.D.   On: 11/25/2014 17:24   Ct Cervical Spine Wo Contrast  11/25/2014   CLINICAL DATA:  Fall, RIGHT arm pain and deformity, struck head, no loss of consciousness, history hypertension, breast cancer, colon cancer  EXAM: CT HEAD WITHOUT CONTRAST  CT CERVICAL SPINE WITHOUT CONTRAST  TECHNIQUE: Multidetector CT imaging of the head and cervical spine was performed following the standard protocol without intravenous contrast. Multiplanar CT image reconstructions of the cervical spine were also generated.  COMPARISON:  CT head 02/15/2012  FINDINGS: CT HEAD FINDINGS  Generalized atrophy.  Normal ventricular morphology.  No midline shift or mass effect.  Small vessel chronic ischemic changes of deep cerebral white matter.  No intracranial hemorrhage, mass lesion, or acute infarction.  Visualized paranasal sinuses and mastoid air cells clear.  Bones demineralized.  Small RIGHT frontal scalp hematoma.  Atherosclerotic calcifications at carotid siphons.  CT CERVICAL SPINE FINDINGS  Bones demineralized.  Prevertebral soft tissues normal thickness.  Vertebral body heights maintained.  Disc space narrowing C5-C6 and C6-C7 with minimal retrolisthesis at C5-C6.  Multilevel facet degenerative changes.  Visualized skullbase intact.  No acute fracture, additional subluxation or bone destruction.  Bone island C3 vertebral body.  Lung apices clear.  Tiny nonspecific RIGHT thyroid nodules.  IMPRESSION: No acute intracranial abnormalities.  Atrophy with mild small vessel chronic ischemic changes of deep cerebral white matter.  Osseous demineralization with degenerative disc and facet disease changes cervical spine and mild retrolisthesis at C5-C6.  No acute cervical spine abnormalities.   Electronically Signed   By: Lavonia Dana M.D.   On:  11/25/2014 17:24   Ct Humerus Right Wo Contrast  11/26/2014   CLINICAL DATA:  Further characterization of right distal humerus fracture after fall earlier this day.  EXAM: CT OF THE RIGHT HUMERUS WITHOUT CONTRAST  TECHNIQUE: Multidetector CT imaging was performed according to the standard protocol. Multiplanar CT image reconstructions were also generated.  COMPARISON:  Radiographs earlier this day.  FINDINGS: Fracture of the mid distal humeral diaphysis with 1 cm displacement, mild comminution, and minimal osseous overriding. There is no distal extension to the elbow joint. The proximal humerus is intact. The elbow joint is maintained. No evidence of underlying lesion. There is diffuse soft tissue edema about the upper arm at the fracture site. There is no elbow joint effusion.  IMPRESSION: Fracture of the mid distal humeral diaphysis with displacement, mild comminution and minimal osseous overriding. No extension to  the elbow joint.   Electronically Signed   By: Jeb Levering M.D.   On: 11/26/2014 00:55   Ct 3d Recon At Scanner  11/26/2014   CLINICAL DATA:  Abnormal findings on radiological and other examination of musculoskeletal system. Fracture of the right humerus.  EXAM: 3-DIMENSIONAL CT IMAGE RENDERING ON ACQUISITION WORKSTATION  TECHNIQUE: 3-dimensional CT images were rendered by post-processing of the original CT data on an acquisition workstation.  COMPARISON:  Radiographs dated 11/25/2014  FINDINGS: Three-dimensional images were created and better demonstrate the anatomic relationships of the comminuted spiral fracture of the distal right humeral shaft. There is angulation and displacement. The fracture does not extend into the elbow joint.  IMPRESSION: Comminuted angulated displaced spiral fracture of the distal right humeral shaft.   Electronically Signed   By: Lorriane Shire M.D.   On: 11/26/2014 12:27   Dg Humerus Right  11/26/2014   CLINICAL DATA:  Status post surgery for right humeral  fracture.  EXAM: RIGHT HUMERUS - 2+ VIEW  COMPARISON:  11/25/2014  FINDINGS: The patient has undergone interval screw and plate fixation of the obliquely oriented distal humerus fracture. The hardware components and the fracture fragments are in anatomic alignment. There is no complication noted.  IMPRESSION: 1. Status post ORIF of distal humerus fracture.   Electronically Signed   By: Kerby Moors M.D.   On: 11/26/2014 16:30   Dg Humerus Right  11/26/2014   CLINICAL DATA:  ORIF right humerus.  EXAM: DG C-ARM 61-120 MIN; RIGHT HUMERUS - 2+ VIEW  COMPARISON:  CT 11/26/2014.  Plain films 11/25/2014  FLUOROSCOPY TIME:  Radiation Exposure Index (as provided by the fluoroscopic device):  If the device does not provide the exposure index:  Fluoroscopy Time:  8 seconds  Number of Acquired Images:  0  FINDINGS: Four intraoperative spot images demonstrate plate and screw fixation across the comminuted right humeral fracture. Anatomic alignment. No complicating feature.  IMPRESSION: Internal fixation across the right humeral fracture.   Electronically Signed   By: Rolm Baptise M.D.   On: 11/26/2014 15:37   Dg Humerus Right  11/25/2014   CLINICAL DATA:  Fall with significant distal arm pain and deformity  EXAM: RIGHT HUMERUS - 2+ VIEW  COMPARISON:  None.  FINDINGS: There is an oblique comminuted fracture through the mid to distal right humerus. Some posterior and lateral displacement of the distal fracture fragment is noted. No other fractures are seen.  IMPRESSION: Fracture of the mid to distal right humerus   Electronically Signed   By: Inez Catalina M.D.   On: 11/25/2014 17:36   Dg C-arm 1-60 Min  11/26/2014   CLINICAL DATA:  ORIF right humerus.  EXAM: DG C-ARM 61-120 MIN; RIGHT HUMERUS - 2+ VIEW  COMPARISON:  CT 11/26/2014.  Plain films 11/25/2014  FLUOROSCOPY TIME:  Radiation Exposure Index (as provided by the fluoroscopic device):  If the device does not provide the exposure index:  Fluoroscopy Time:  8 seconds   Number of Acquired Images:  0  FINDINGS: Four intraoperative spot images demonstrate plate and screw fixation across the comminuted right humeral fracture. Anatomic alignment. No complicating feature.  IMPRESSION: Internal fixation across the right humeral fracture.   Electronically Signed   By: Rolm Baptise M.D.   On: 11/26/2014 15:37    Assessment & Plan:   Stephanie Martinez was seen today for cough and hypertension.  Diagnoses and all orders for this visit:  COPD bronchitis- she will start using Breo. I gave her  some samples of the inhaler and showed her how to use it. She demonstrated proficiency with its use. -     Fluticasone Furoate-Vilanterol (BREO ELLIPTA) 100-25 MCG/INH AEPB; Inhale 1 puff into the lungs daily.  Essential hypertension, benign- Her BP is well controlled -     carvedilol (COREG) 6.25 MG tablet; Take 1 tablet (6.25 mg total) by mouth 2 (two) times daily with a meal.   I have discontinued Ms. Schrimpf's calcium citrate-vitamin D, docusate sodium, traMADol, polyethylene glycol, amLODipine, omeprazole, omeprazole, and Aclidinium Bromide. I am also having her start on Fluticasone Furoate-Vilanterol. Additionally, I am having her maintain her latanoprost, LUTEIN PO, aspirin, GLUCOSAMINE HCL PO, vitamin C, acetaminophen, Cholecalciferol, losartan-hydrochlorothiazide, and carvedilol.  Meds ordered this encounter  Medications  . losartan-hydrochlorothiazide (HYZAAR) 100-12.5 MG per tablet    Sig:     Refill:  2  . Fluticasone Furoate-Vilanterol (BREO ELLIPTA) 100-25 MCG/INH AEPB    Sig: Inhale 1 puff into the lungs daily.    Dispense:  30 each    Refill:  11  . carvedilol (COREG) 6.25 MG tablet    Sig: Take 1 tablet (6.25 mg total) by mouth 2 (two) times daily with a meal.    Dispense:  180 tablet    Refill:  1     Follow-up: Return in about 4 months (around 07/11/2015).  Scarlette Calico, MD

## 2015-03-11 NOTE — Patient Instructions (Signed)

## 2015-03-11 NOTE — Progress Notes (Signed)
Pre visit review using our clinic review tool, if applicable. No additional management support is needed unless otherwise documented below in the visit note. 

## 2015-05-23 ENCOUNTER — Ambulatory Visit (INDEPENDENT_AMBULATORY_CARE_PROVIDER_SITE_OTHER): Payer: Medicare Other | Admitting: Internal Medicine

## 2015-05-23 ENCOUNTER — Encounter: Payer: Self-pay | Admitting: Internal Medicine

## 2015-05-23 VITALS — HR 110 | Ht 62.0 in | Wt 122.8 lb

## 2015-05-23 DIAGNOSIS — R05 Cough: Secondary | ICD-10-CM | POA: Diagnosis not present

## 2015-05-23 DIAGNOSIS — I10 Essential (primary) hypertension: Secondary | ICD-10-CM

## 2015-05-23 DIAGNOSIS — R058 Other specified cough: Secondary | ICD-10-CM | POA: Insufficient documentation

## 2015-05-23 MED ORDER — PREDNISONE 10 MG PO TABS: ORAL_TABLET | ORAL | Status: DC

## 2015-05-23 MED ORDER — FAMOTIDINE 20 MG PO TABS: ORAL_TABLET | ORAL | Status: DC

## 2015-05-23 MED ORDER — PANTOPRAZOLE SODIUM 40 MG PO TBEC: 40.0000 mg | DELAYED_RELEASE_TABLET | Freq: Every day | ORAL | Status: DC

## 2015-05-23 MED ORDER — ACETAMINOPHEN-CODEINE #3 300-30 MG PO TABS: ORAL_TABLET | ORAL | Status: DC

## 2015-05-23 NOTE — Progress Notes (Signed)
Subjective:    Patient ID: Stephanie Martinez, female    DOB: 1921-11-11,    MRN: [id number]  HPI  54 yowf quit smoking x 1980 s resp complaints until then cough x 07/2014 refractory to rx for copd/ab so referred to pulmonary clinic 05/23/2015 by Dr Darene Lamer Ronnald Ramp    05/23/2015 1st [facility] office visit/ Stephanie Martinez   Chief Complaint  Patient presents with  . PULMONARY CONSULT    Referred by Dr. Kennith Center. Pt c/o dry cough but feels like there is mucus in her chest and some hoarseness. Pt denies wheeze/SOB/CP/tightness. Pt has sampled several inhalers and none seem to have really helped much. Pt's daughter reports that she breathes very shallowly when sleeping.   indolent onset x 07/2014 persistent daily cough tends to be after supper > wolicki eval neg ? Takes omeprazole at hs though not on her med list. No better with Breo/ mostly dry assoc with hoarseness and sense of pnds/ not present generally at hs or disturbing sleep.   No obvious other patterns in day to day or daytime variabilty or assoc sob  or cp or chest tightness, subjective wheeze overt sinus or hb symptoms. No unusual exp hx or h/o childhood pna/ asthma or knowledge of premature birth.  Sleeping ok without nocturnal  or early am exacerbation  of respiratory  c/o's or need for noct saba. Also denies any obvious fluctuation of symptoms with weather or environmental changes or other aggravating or alleviating factors except as outlined above   Current Medications, Allergies, Complete Past Medical History, Past Surgical History, Family History, and Social History were reviewed in [employer] record.              Review of Systems  Constitutional: Negative.  Negative for fever and unexpected weight change.  HENT: Positive for postnasal drip. Negative for congestion, dental problem, ear pain, nosebleeds, rhinorrhea, sinus pressure, sneezing, sore throat and trouble swallowing.   Eyes: Negative.  Negative  for redness and itching.  Respiratory: Positive for cough. Negative for chest tightness, shortness of breath and wheezing.   Cardiovascular: Positive for leg swelling. Negative for palpitations.  Gastrointestinal: Negative.  Negative for nausea and vomiting.  Endocrine: Negative.   Genitourinary: Negative.  Negative for dysuria.  Musculoskeletal: Positive for joint swelling and arthralgias.  Skin: Negative.  Negative for rash.  Allergic/Immunologic: Negative.   Neurological: Positive for seizures. Negative for headaches.  Hematological: Negative.  Does not bruise/bleed easily.  Psychiatric/Behavioral: Negative.  Negative for dysphoric mood. The patient is not nervous/anxious.        Objective:   Physical Exam  Frail elderly wf needs 2 person assist to get on exam table  Wt Readings from Last 3 Encounters:  05/23/15 122 lb 12.8 oz (55.702 kg)  03/11/15 125 lb (56.7 kg)  11/14/14 133 lb (60.328 kg)    Vital signs reviewed   HEENT: nl dentition, turbinates, and oropharynx. Nl external ear canals without cough reflex   NECK :  without JVD/Nodes/TM/ nl carotid upstrokes bilaterally   LUNGS: no acc muscle use,  slt barrel contour chest which is clear to A and P c distant bs  bilaterally without cough on insp or exp maneuvers   CV:  RRR  no s3 or murmur or increase in P2, no edema   ABD:  soft and nontender with end insp hoover's sign  in the supine position. No bruits or organomegaly, bowel sounds nl  MS:  Nl gait/ ext warm  without deformities, calf tenderness, cyanosis or clubbing No obvious joint restrictions   SKIN: warm and dry without lesions    NEURO:  alert, approp, nl sensorium with  no motor deficits but  gen weakness      I personally reviewed images and agree with radiology impression as follows:  CXR:  11/25/14  Stable exam. Cardiomegaly with chronic changes        Assessment & Plan:

## 2015-05-23 NOTE — Assessment & Plan Note (Signed)
Not Adequate control on present rx, reviewed > relatively tachycardic despite BB today per daughter > bystolic 5 mg sample given and she will monitor it at home > Follow up per Primary Care planned

## 2015-05-23 NOTE — Assessment & Plan Note (Signed)
The most common causes of chronic cough in immunocompetent adults include the following: upper airway cough syndrome (UACS), previously referred to as postnasal drip syndrome (PNDS), which is caused by variety of rhinosinus conditions; (2) asthma; (3) GERD; (4) chronic bronchitis from cigarette smoking or other inhaled environmental irritants; (5) nonasthmatic eosinophilic bronchitis; and (6) bronchiectasis.   These conditions, singly or in combination, have accounted for up to 94% of the causes of chronic cough in prospective studies.   Other conditions have constituted no >6% of the causes in prospective studies These have included bronchogenic carcinoma, chronic interstitial pneumonia, sarcoidosis, left ventricular failure, ACEI-induced cough, and aspiration from a condition associated with pharyngeal dysfunction.    Chronic cough is often simultaneously caused by more than one condition. A single cause has been found from 38 to 82% of the time, multiple causes from 18 to 62%. Multiply caused cough has been the result of three diseases up to 42% of the time.       Based on hx and exam, this is most likely:  Classic Upper airway cough syndrome, so named because it's frequently impossible to sort out how much is  CR/sinusitis with freq throat clearing (which can be related to primary GERD)   vs  causing  secondary (" extra esophageal")  GERD from wide swings in gastric pressure that occur with throat clearing, often  promoting self use of mint and menthol lozenges that reduce the lower esophageal sphincter tone and exacerbate the problem further in a cyclical fashion.   These are the same pts (now being labeled as having "irritable larynx syndrome" by some cough centers) who not infrequently have a history of having failed to tolerate ace inhibitors,  dry powder inhalers or biphosphonates or report having atypical reflux symptoms that don't respond to standard doses of PPI , and are easily confused as  having aecopd or asthma flares by even experienced allergists/ pulmonologists.   The first step is to maximize acid suppression and eliminate cyclical coughing then regroup if the cough persists.  I had an extended discussion with the patient reviewing all relevant studies completed to date and  lasting 35 min  1) Explained: The standardized cough guidelines published in Chest by Richard Irwin in 2006 are still the best available and consist of a multiple step process (up to 12!) , not a single office visit,  and are intended  to address this problem logically,  with an alogrithm dependent on response to empiric treatment at  each progressive step  to determine a specific diagnosis with  minimal addtional testing needed. Therefore if adherence is an issue or can't be accurately verified,  it's very unlikely the standard evaluation and treatment will be successful here.    Furthermore, response to therapy (other than acute cough suppression, which should only be used short term with avoidance of narcotic containing cough syrups if possible), can be a gradual process for which the patient may perceive immediate benefit.  Unlike going to an eye doctor where the best perscription is almost always the first one and is immediately effective, this is almost never the case in the management of chronic cough syndromes. Therefore the patient needs to commit up front to consistently adhere to recommendations  for up to 6 weeks of therapy directed at the likely underlying problem(s) before the response can be reasonably evaluated.     2) Each maintenance medication was reviewed in detail including most importantly the difference between maintenance and prns and under what   circumstances the prns are to be triggered using an action plan format that is not reflected in the computer generated alphabetically organized AVS.    Please see instructions for details which were reviewed in writing and the patient given a  copy highlighting the part that I personally wrote and discussed at today's ov.   See instructions for specific recommendations which were reviewed directly with the patient who was given a copy with highlighter outlining the key components.   

## 2015-05-23 NOTE — Patient Instructions (Addendum)
Prednisone 10 mg take  4 each am x 2 days,   2 each am x 2 days,  1 each am x 2 days and stop   Take delsym two tsp every 12 hours and supplement if needed with  Tylenol #3  every 4 hours to suppress the urge to cough. Swallowing water or using ice chips/non mint and menthol containing candies (such as lifesavers or sugarless jolly ranchers) are also effective.  You should rest your voice and avoid activities that you know make you cough.  Once you have eliminated the cough for 3 straight days try reducing the tylenol #3  first,  then the delsym as tolerated.     GERD (REFLUX)  is an extremely common cause of respiratory symptoms just like yours , many times with no obvious heartburn at all.    It can be treated with medication, but also with lifestyle changes including elevation of the head of your bed (ideally with 6 inch  bed blocks),  Smoking cessation, avoidance of late meals, excessive alcohol, and avoid fatty foods, chocolate, peppermint, colas, red wine, and acidic juices such as orange juice.  NO MINT OR MENTHOL PRODUCTS SO NO COUGH DROPS  USE SUGARLESS CANDY INSTEAD (Jolley ranchers or Stover's or Life Savers) or even ice chips will also do - the key is to swallow to prevent all throat clearing. NO OIL BASED VITAMINS - use powdered substitutes.    Pantoprazole (protonix) 40 mg   Take  30-60 min before first meal of the day and Pepcid (famotidine)  20 mg one after supper  until return to office - this is the best way to tell whether stomach acid is contributing to your problem.     Please schedule a follow up office visit in 2 weeks, sooner if needed - bring all active meds with you

## 2015-06-13 ENCOUNTER — Ambulatory Visit (INDEPENDENT_AMBULATORY_CARE_PROVIDER_SITE_OTHER): Payer: Medicare Other | Admitting: Internal Medicine

## 2015-06-13 ENCOUNTER — Encounter: Payer: Self-pay | Admitting: Internal Medicine

## 2015-06-13 VITALS — BP 144/82 | HR 119 | Ht 62.0 in | Wt 125.4 lb

## 2015-06-13 DIAGNOSIS — R05 Cough: Secondary | ICD-10-CM | POA: Diagnosis not present

## 2015-06-13 DIAGNOSIS — R058 Other specified cough: Secondary | ICD-10-CM

## 2015-06-13 MED ORDER — PREDNISONE 10 MG PO TABS: ORAL_TABLET | ORAL | Status: DC
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
## 2015-06-13 NOTE — Patient Instructions (Addendum)
Take delsym two tsp every 12 hours and supplement if needed with tylenol #3  mg up to 2 every 4 hours to suppress the urge to cough. Swallowing water or using ice chips/non mint and menthol containing candies (such as lifesavers or sugarless jolly ranchers) are also effective.  You should rest your voice and avoid activities that you know make you cough.  Once you have eliminated the cough for 3 straight days try reducing the tylenol #3 first,  then the delsym as tolerated.    If needed >>> Prednisone 10 mg take  4 each am x 2 days,   2 each am x 2 days,  1 each am x 2 days and stop   We may need to consider alternative blood pressure pill on trial basis if this cough persists   If relapse again we need a Diagnostic esophagram call if you want me to schedule

## 2015-06-16 MED ORDER — ACETAMINOPHEN-CODEINE #3 300-30 MG PO TABS: ORAL_TABLET | ORAL | Status: DC

## 2015-06-16 NOTE — Assessment & Plan Note (Signed)
I am not as comfortable as she is that we have corrected at this point but she does in fact appear quite a bit better than she was. I'm not actually sure that she took any of the medicines we recommended correctly (other than prednisone) . Another issue that may need to be addressed is the cough with swallowing and a sensation of something getting "stuck in her throat" which is typical actually of upper airway cough syndromes from the cough itself, namely the more she coughs she  Inflames the upper airway.  A f/u esophagram can be considered.  I recommended also consideration for 2 of her blood pressure medicines which have been anecdotally reported to cause cough, but I think neither of these  are likely. Namely, Coreg can cause cough variant asthma, though not usually in such low doses,  and Hyzaar can cause an upper airway cough syndrome like ace inhibitors although this is rare.  I had an extended discussion with the patient reviewing all relevant studies completed to date and  lasting 15 to 20 minutes of a 25 minute visit    Each maintenance medication was reviewed in detail including most importantly the difference between maintenance and prns and under what circumstances the prns are to be triggered using an action plan format that is not reflected in the computer generated alphabetically organized AVS.    Please see instructions for details which were reviewed in writing and the patient given a copy highlighting the part that I personally wrote and discussed at today's ov.

## 2015-07-15 ENCOUNTER — Ambulatory Visit: Payer: Medicare Other | Admitting: Internal Medicine

## 2015-07-22 ENCOUNTER — Encounter: Payer: Self-pay | Admitting: Internal Medicine

## 2015-07-22 ENCOUNTER — Encounter (INDEPENDENT_AMBULATORY_CARE_PROVIDER_SITE_OTHER): Payer: Self-pay

## 2015-07-22 ENCOUNTER — Ambulatory Visit (INDEPENDENT_AMBULATORY_CARE_PROVIDER_SITE_OTHER): Payer: Medicare Other | Admitting: Internal Medicine

## 2015-07-22 ENCOUNTER — Other Ambulatory Visit (INDEPENDENT_AMBULATORY_CARE_PROVIDER_SITE_OTHER): Payer: Medicare Other

## 2015-07-22 VITALS — BP 140/80 | HR 90 | Temp 98.6°F | Resp 16 | Ht 62.0 in | Wt 126.0 lb

## 2015-07-22 DIAGNOSIS — N183 Chronic kidney disease, stage 3 unspecified: Secondary | ICD-10-CM

## 2015-07-22 DIAGNOSIS — R058 Other specified cough: Secondary | ICD-10-CM

## 2015-07-22 DIAGNOSIS — I10 Essential (primary) hypertension: Secondary | ICD-10-CM

## 2015-07-22 DIAGNOSIS — R569 Unspecified convulsions: Secondary | ICD-10-CM | POA: Diagnosis not present

## 2015-07-22 DIAGNOSIS — R05 Cough: Secondary | ICD-10-CM | POA: Diagnosis not present

## 2015-07-22 LAB — CBC WITH DIFFERENTIAL/PLATELET
BASOS ABS: 0 10*3/uL (ref 0.0–0.1)
BASOS PCT: 0.5 % (ref 0.0–3.0)
Eosinophils Absolute: 0.4 10*3/uL (ref 0.0–0.7)
Eosinophils Relative: 5.3 % — ABNORMAL HIGH (ref 0.0–5.0)
HEMATOCRIT: 37.2 % (ref 36.0–46.0)
Hemoglobin: 12.6 g/dL (ref 12.0–15.0)
LYMPHS ABS: 1.2 10*3/uL (ref 0.7–4.0)
LYMPHS PCT: 15.6 % (ref 12.0–46.0)
MCHC: 33.9 g/dL (ref 30.0–36.0)
MCV: 96.2 fl (ref 78.0–100.0)
MONOS PCT: 8.3 % (ref 3.0–12.0)
Monocytes Absolute: 0.6 10*3/uL (ref 0.1–1.0)
NEUTROS ABS: 5.3 10*3/uL (ref 1.4–7.7)
Neutrophils Relative %: 70.3 % (ref 43.0–77.0)
Platelets: 323 10*3/uL (ref 150.0–400.0)
RBC: 3.87 Mil/uL (ref 3.87–5.11)
RDW: 12.3 % (ref 11.5–15.5)
WBC: 7.5 10*3/uL (ref 4.0–10.5)

## 2015-07-22 MED ORDER — HYDROCODONE-HOMATROPINE 5-1.5 MG/5ML PO SYRP: 5.0000 mL | ORAL_SOLUTION | Freq: Four times a day (QID) | ORAL | Status: DC | PRN

## 2015-07-22 NOTE — Progress Notes (Signed)
Pre visit review using our clinic review tool, if applicable. No additional management support is needed unless otherwise documented below in the visit note. 

## 2015-07-22 NOTE — Progress Notes (Signed)
Subjective:  Patient ID: Stephanie Martinez, female    DOB: 1921-08-31  Age: 82 y.o. MRN: [id number]  CC: Cough and Hypertension   HPI SASCHA DISMUKES presents for follow up-her daughter is with her today and complains that her mother had a spell a week ago. She has had spells like this for 10 years. They were previously attributed to seizure disorder. About a week ago she was sitting and her upper and lower extremities when into contraction and her face froze. She looked paralyzed for nearly a minute or 2 and then had profuse vomiting. She slowly recovered from it. The other complaint is a chronic cough. She has seen pulmonology. She has upper airway cough syndrome. She has tried dextromethorphan, diphenhydramine, proton pump inhibitor as well as Tylenol with Codeine. That has helped only minimally. She also tried a course of steroids which helped briefly. She came complains of, persistent cough that brings up clear phlegm and occasional episode of gagging and choking. The coughing keeps her awake at night.  Outpatient Prescriptions Prior to Visit  Medication Sig Dispense Refill  . acetaminophen (TYLENOL) 500 MG tablet Take 2 tablets (1,000 mg total) by mouth every 6 (six) hours. 90 tablet 1  . aspirin 81 MG tablet Take 81 mg by mouth daily.    . Cholecalciferol 50000 UNITS TABS Take 1 tablet by mouth once a week. 12 tablet 3  . diphenhydramine-acetaminophen (TYLENOL PM) 25-500 MG TABS tablet Take 1 tablet by mouth at bedtime as needed.    Marland Kitchen GLUCOSAMINE HCL PO Take 1,000 mg by mouth daily.    Marland Kitchen latanoprost (XALATAN) 0.005 % ophthalmic solution Place 1 drop into both eyes at bedtime.     . LUTEIN PO Take 25 mg by mouth daily.    . pantoprazole (PROTONIX) 40 MG tablet Take 1 tablet (40 mg total) by mouth daily. Take 30-60 min before first meal of the day 30 tablet 2  . vitamin C (ASCORBIC ACID) 500 MG tablet Take 500 mg by mouth daily.    . carvedilol (COREG) 6.25 MG tablet Take 1 tablet  (6.25 mg total) by mouth 2 (two) times daily with a meal. (Patient taking differently: Take 6.25 mg by mouth daily. ) 180 tablet 1  . predniSONE (DELTASONE) 10 MG tablet Take  4 each am x 2 days,   2 each am x 2 days,  1 each am x 2 days and stop 14 tablet 0  . famotidine (PEPCID) 20 MG tablet One at bedtime (Patient not taking: Reported on 07/22/2015) 30 tablet 2  . losartan-hydrochlorothiazide (HYZAAR) 100-12.5 MG per tablet Reported on 07/22/2015  2  . acetaminophen-codeine (TYLENOL #3) 300-30 MG tablet One every 4 hours as needed for cough (Patient not taking: Reported on 07/22/2015) 40 tablet 0   No facility-administered medications prior to visit.    ROS Review of Systems  Constitutional: Negative.  Negative for fever, chills, diaphoresis, appetite change and fatigue.  HENT: Negative.  Negative for sinus pressure, sore throat, trouble swallowing and voice change.   Eyes: Negative.  Negative for visual disturbance.  Respiratory: Positive for cough and choking. Negative for chest tightness, shortness of breath, wheezing and stridor.   Cardiovascular: Negative.  Negative for chest pain and leg swelling.  Gastrointestinal: Negative.  Negative for nausea, vomiting, abdominal pain, diarrhea and constipation.  Endocrine: Negative.   Genitourinary: Negative.   Musculoskeletal: Negative.  Negative for myalgias, back pain, joint swelling and arthralgias.  Skin: Negative.  Negative for color  change and rash.  Allergic/Immunologic: Negative.   Neurological: Positive for seizures. Negative for dizziness, tremors, facial asymmetry, speech difficulty, weakness and light-headedness.  Hematological: Negative.  Negative for adenopathy. Does not bruise/bleed easily.  Psychiatric/Behavioral: Negative.     Objective:  BP 140/80 mmHg  Pulse 90  Temp(Src) 98.6 F (37 C) (Oral)  Resp 16  Ht 5\' 2"  (1.575 m)  Wt 126 lb (57.153 kg)  BMI 23.04 kg/m2  SpO2 97%  BP Readings from Last 3 Encounters:    07/22/15 140/80  06/13/15 144/82  03/11/15 120/82    Wt Readings from Last 3 Encounters:  07/22/15 126 lb (57.153 kg)  06/13/15 125 lb 6.4 oz (56.881 kg)  05/23/15 122 lb 12.8 oz (55.702 kg)    Physical Exam  Constitutional: She is oriented to person, place, and time. No distress.  HENT:  Head: Normocephalic and atraumatic.  Mouth/Throat: Oropharynx is clear and moist. No oropharyngeal exudate.  Eyes: Conjunctivae are normal. Right eye exhibits no discharge. Left eye exhibits no discharge. No scleral icterus.  Neck: Normal range of motion. Neck supple. No JVD present. No tracheal deviation present. No thyromegaly present.  Cardiovascular: Normal rate, regular rhythm, normal heart sounds and intact distal pulses.  Exam reveals no gallop and no friction rub.   No murmur heard. Pulmonary/Chest: Effort normal and breath sounds normal. No stridor. No respiratory distress. She has no wheezes. She has no rales. She exhibits no tenderness.  Abdominal: Soft. Bowel sounds are normal. She exhibits no distension and no mass. There is no tenderness. There is no rebound and no guarding.  Musculoskeletal: Normal range of motion. She exhibits no edema.  Lymphadenopathy:    She has no cervical adenopathy.  Neurological: She is oriented to person, place, and time.  Skin: Skin is warm and dry. No rash noted. She is not diaphoretic. No erythema. No pallor.  Psychiatric: She has a normal mood and affect. Her behavior is normal. Judgment and thought content normal.    Lab Results  Component Value Date   WBC 7.5 07/22/2015   HGB 12.6 07/22/2015   HCT 37.2 07/22/2015   PLT 323.0 07/22/2015   GLUCOSE 104* 07/22/2015   CHOL 155 11/26/2014   TRIG 102 11/26/2014   HDL 33* 11/26/2014   LDLDIRECT 124.0 11/14/2014   LDLCALC 102* 11/26/2014   ALT 15 11/26/2014   AST 19 11/26/2014   NA 134* 07/22/2015   K 4.0 07/22/2015   CL 95* 07/22/2015   CREATININE 1.61* 07/22/2015   BUN 21 07/22/2015   CO2 26  07/22/2015   TSH 2.05 11/14/2014   INR 1.11 11/25/2014   HGBA1C 5.7 09/18/2012    Dg Chest 2 View  11/25/2014  CLINICAL DATA:  Patient fell today. Tenderness over the RIGHT lower chest. Distal humerus fracture. EXAM: CHEST  2 VIEW COMPARISON:  11/14/2014. FINDINGS: Cardiomegaly. Aortic atherosclerosis. Bibasilar scarring or atelectasis. No overt failure or infiltrates. The bones are demineralized. I do not see a definite rib fracture. There is no pneumothorax. IMPRESSION: Stable exam. Cardiomegaly with chronic changes. No definite rib fracture or RIGHT pleural effusion. Electronically Signed   By: Rolla Flatten M.D.   On: 11/25/2014 21:08   Ct Head Wo Contrast  11/25/2014  CLINICAL DATA:  Fall, RIGHT arm pain and deformity, struck head, no loss of consciousness, history hypertension, breast cancer, colon cancer EXAM: CT HEAD WITHOUT CONTRAST CT CERVICAL SPINE WITHOUT CONTRAST TECHNIQUE: Multidetector CT imaging of the head and cervical spine was performed following the standard  protocol without intravenous contrast. Multiplanar CT image reconstructions of the cervical spine were also generated. COMPARISON:  CT head 02/15/2012 FINDINGS: CT HEAD FINDINGS Generalized atrophy. Normal ventricular morphology. No midline shift or mass effect. Small vessel chronic ischemic changes of deep cerebral white matter. No intracranial hemorrhage, mass lesion, or acute infarction. Visualized paranasal sinuses and mastoid air cells clear. Bones demineralized. Small RIGHT frontal scalp hematoma. Atherosclerotic calcifications at carotid siphons. CT CERVICAL SPINE FINDINGS Bones demineralized. Prevertebral soft tissues normal thickness. Vertebral body heights maintained. Disc space narrowing C5-C6 and C6-C7 with minimal retrolisthesis at C5-C6. Multilevel facet degenerative changes. Visualized skullbase intact. No acute fracture, additional subluxation or bone destruction. Bone island C3 vertebral body. Lung apices clear. Tiny  nonspecific RIGHT thyroid nodules. IMPRESSION: No acute intracranial abnormalities. Atrophy with mild small vessel chronic ischemic changes of deep cerebral white matter. Osseous demineralization with degenerative disc and facet disease changes cervical spine and mild retrolisthesis at C5-C6. No acute cervical spine abnormalities. Electronically Signed   By: Lavonia Dana M.D.   On: 11/25/2014 17:24   Ct Cervical Spine Wo Contrast  11/25/2014  CLINICAL DATA:  Fall, RIGHT arm pain and deformity, struck head, no loss of consciousness, history hypertension, breast cancer, colon cancer EXAM: CT HEAD WITHOUT CONTRAST CT CERVICAL SPINE WITHOUT CONTRAST TECHNIQUE: Multidetector CT imaging of the head and cervical spine was performed following the standard protocol without intravenous contrast. Multiplanar CT image reconstructions of the cervical spine were also generated. COMPARISON:  CT head 02/15/2012 FINDINGS: CT HEAD FINDINGS Generalized atrophy. Normal ventricular morphology. No midline shift or mass effect. Small vessel chronic ischemic changes of deep cerebral white matter. No intracranial hemorrhage, mass lesion, or acute infarction. Visualized paranasal sinuses and mastoid air cells clear. Bones demineralized. Small RIGHT frontal scalp hematoma. Atherosclerotic calcifications at carotid siphons. CT CERVICAL SPINE FINDINGS Bones demineralized. Prevertebral soft tissues normal thickness. Vertebral body heights maintained. Disc space narrowing C5-C6 and C6-C7 with minimal retrolisthesis at C5-C6. Multilevel facet degenerative changes. Visualized skullbase intact. No acute fracture, additional subluxation or bone destruction. Bone island C3 vertebral body. Lung apices clear. Tiny nonspecific RIGHT thyroid nodules. IMPRESSION: No acute intracranial abnormalities. Atrophy with mild small vessel chronic ischemic changes of deep cerebral white matter. Osseous demineralization with degenerative disc and facet disease  changes cervical spine and mild retrolisthesis at C5-C6. No acute cervical spine abnormalities. Electronically Signed   By: Lavonia Dana M.D.   On: 11/25/2014 17:24   Ct Humerus Right Wo Contrast  11/26/2014  CLINICAL DATA:  Further characterization of right distal humerus fracture after fall earlier this day. EXAM: CT OF THE RIGHT HUMERUS WITHOUT CONTRAST TECHNIQUE: Multidetector CT imaging was performed according to the standard protocol. Multiplanar CT image reconstructions were also generated. COMPARISON:  Radiographs earlier this day. FINDINGS: Fracture of the mid distal humeral diaphysis with 1 cm displacement, mild comminution, and minimal osseous overriding. There is no distal extension to the elbow joint. The proximal humerus is intact. The elbow joint is maintained. No evidence of underlying lesion. There is diffuse soft tissue edema about the upper arm at the fracture site. There is no elbow joint effusion. IMPRESSION: Fracture of the mid distal humeral diaphysis with displacement, mild comminution and minimal osseous overriding. No extension to the elbow joint. Electronically Signed   By: Jeb Levering M.D.   On: 11/26/2014 00:55   Ct 3d Recon At Scanner  11/26/2014  CLINICAL DATA:  Abnormal findings on radiological and other examination of musculoskeletal system. Fracture of the right  humerus. EXAM: 3-DIMENSIONAL CT IMAGE RENDERING ON ACQUISITION WORKSTATION TECHNIQUE: 3-dimensional CT images were rendered by post-processing of the original CT data on an acquisition workstation. COMPARISON:  Radiographs dated 11/25/2014 FINDINGS: Three-dimensional images were created and better demonstrate the anatomic relationships of the comminuted spiral fracture of the distal right humeral shaft. There is angulation and displacement. The fracture does not extend into the elbow joint. IMPRESSION: Comminuted angulated displaced spiral fracture of the distal right humeral shaft. Electronically Signed   By: Lorriane Shire M.D.   On: 11/26/2014 12:27   Dg Humerus Right  11/26/2014  CLINICAL DATA:  Status post surgery for right humeral fracture. EXAM: RIGHT HUMERUS - 2+ VIEW COMPARISON:  11/25/2014 FINDINGS: The patient has undergone interval screw and plate fixation of the obliquely oriented distal humerus fracture. The hardware components and the fracture fragments are in anatomic alignment. There is no complication noted. IMPRESSION: 1. Status post ORIF of distal humerus fracture. Electronically Signed   By: Kerby Moors M.D.   On: 11/26/2014 16:30   Dg Humerus Right  11/26/2014  CLINICAL DATA:  ORIF right humerus. EXAM: DG C-ARM 61-120 MIN; RIGHT HUMERUS - 2+ VIEW COMPARISON:  CT 11/26/2014.  Plain films 11/25/2014 FLUOROSCOPY TIME:  Radiation Exposure Index (as provided by the fluoroscopic device): If the device does not provide the exposure index: Fluoroscopy Time:  8 seconds Number of Acquired Images:  0 FINDINGS: Four intraoperative spot images demonstrate plate and screw fixation across the comminuted right humeral fracture. Anatomic alignment. No complicating feature. IMPRESSION: Internal fixation across the right humeral fracture. Electronically Signed   By: Rolm Baptise M.D.   On: 11/26/2014 15:37   Dg Humerus Right  11/25/2014  CLINICAL DATA:  Fall with significant distal arm pain and deformity EXAM: RIGHT HUMERUS - 2+ VIEW COMPARISON:  None. FINDINGS: There is an oblique comminuted fracture through the mid to distal right humerus. Some posterior and lateral displacement of the distal fracture fragment is noted. No other fractures are seen. IMPRESSION: Fracture of the mid to distal right humerus Electronically Signed   By: Inez Catalina M.D.   On: 11/25/2014 17:36   Dg C-arm 1-60 Min  11/26/2014  CLINICAL DATA:  ORIF right humerus. EXAM: DG C-ARM 61-120 MIN; RIGHT HUMERUS - 2+ VIEW COMPARISON:  CT 11/26/2014.  Plain films 11/25/2014 FLUOROSCOPY TIME:  Radiation Exposure Index (as provided by the  fluoroscopic device): If the device does not provide the exposure index: Fluoroscopy Time:  8 seconds Number of Acquired Images:  0 FINDINGS: Four intraoperative spot images demonstrate plate and screw fixation across the comminuted right humeral fracture. Anatomic alignment. No complicating feature. IMPRESSION: Internal fixation across the right humeral fracture. Electronically Signed   By: Rolm Baptise M.D.   On: 11/26/2014 15:37    Assessment & Plan:   Danyla was seen today for cough and hypertension.  Diagnoses and all orders for this visit:  Essential hypertension, benign- her blood pressure is well-controlled, lites and renal function are stable. -     CBC with Differential/Platelet; Future -     Basic metabolic panel; Future  CKD (chronic kidney disease), stage III- her renal function is stable, she will continue to avoid nephrotoxic agents, will continue to maintain good blood pressure control. -     CBC with Differential/Platelet; Future -     Basic metabolic panel; Future  Seizures ([place]) -     Ambulatory referral to Neurology  Upper airway cough syndrome- will try Hycodan for the cough. -  HYDROcodone-homatropine (HYCODAN) 5-1.5 MG/5ML syrup; Take 5 mLs by mouth every 6 (six) hours as needed for cough.  I have discontinued Ms. Neto's carvedilol, predniSONE, and acetaminophen-codeine. I am also having her start on HYDROcodone-homatropine. Additionally, I am having her maintain her latanoprost, LUTEIN PO, aspirin, GLUCOSAMINE HCL PO, vitamin C, acetaminophen, Cholecalciferol, losartan-hydrochlorothiazide, famotidine, pantoprazole, and diphenhydramine-acetaminophen.  Meds ordered this encounter  Medications  . HYDROcodone-homatropine (HYCODAN) 5-1.5 MG/5ML syrup    Sig: Take 5 mLs by mouth every 6 (six) hours as needed for cough.    Dispense:  240 mL    Refill:  0     Follow-up: Return in about 6 months (around 01/19/2016).  Scarlette Calico, MD

## 2015-07-22 NOTE — Patient Instructions (Signed)
Hypertension Hypertension, commonly called high blood pressure, is when the force of blood pumping through your arteries is too strong. Your arteries are the blood vessels that carry blood from your heart throughout your body. A blood pressure reading consists of a higher number over a lower number, such as 110/72. The higher number (systolic) is the pressure inside your arteries when your heart pumps. The lower number (diastolic) is the pressure inside your arteries when your heart relaxes. Ideally you want your blood pressure below 120/80. Hypertension forces your heart to work harder to pump blood. Your arteries may become narrow or stiff. Having untreated or uncontrolled hypertension can cause heart attack, stroke, kidney disease, and other problems. RISK FACTORS Some risk factors for high blood pressure are controllable. Others are not.  Risk factors you cannot control include:   Race. You may be at higher risk if you are African American.  Age. Risk increases with age.  Gender. Men are at higher risk than women before age 45 years. After age 65, women are at higher risk than men. Risk factors you can control include:  Not getting enough exercise or physical activity.  Being overweight.  Getting too much fat, sugar, calories, or salt in your diet.  Drinking too much alcohol. SIGNS AND SYMPTOMS Hypertension does not usually cause signs or symptoms. Extremely high blood pressure (hypertensive crisis) may cause headache, anxiety, shortness of breath, and nosebleed. DIAGNOSIS To check if you have hypertension, your health care provider will measure your blood pressure while you are seated, with your arm held at the level of your heart. It should be measured at least twice using the same arm. Certain conditions can cause a difference in blood pressure between your right and left arms. A blood pressure reading that is higher than normal on one occasion does not mean that you need treatment. If  it is not clear whether you have high blood pressure, you may be asked to return on a different day to have your blood pressure checked again. Or, you may be asked to monitor your blood pressure at home for 1 or more weeks. TREATMENT Treating high blood pressure includes making lifestyle changes and possibly taking medicine. Living a healthy lifestyle can help lower high blood pressure. You may need to change some of your habits. Lifestyle changes may include:  Following the DASH diet. This diet is high in fruits, vegetables, and whole grains. It is low in salt, red meat, and added sugars.  Keep your sodium intake below 2,300 mg per day.  Getting at least 30-45 minutes of aerobic exercise at least 4 times per week.  Losing weight if necessary.  Not smoking.  Limiting alcoholic beverages.  Learning ways to reduce stress. Your health care provider may prescribe medicine if lifestyle changes are not enough to get your blood pressure under control, and if one of the following is true:  You are 18-59 years of age and your systolic blood pressure is above 140.  You are 60 years of age or older, and your systolic blood pressure is above 150.  Your diastolic blood pressure is above 90.  You have diabetes, and your systolic blood pressure is over 140 or your diastolic blood pressure is over 90.  You have kidney disease and your blood pressure is above 140/90.  You have heart disease and your blood pressure is above 140/90. Your personal target blood pressure may vary depending on your medical conditions, your age, and other factors. HOME CARE INSTRUCTIONS    Have your blood pressure rechecked as directed by your health care provider.   Take medicines only as directed by your health care provider. Follow the directions carefully. Blood pressure medicines must be taken as prescribed. The medicine does not work as well when you skip doses. Skipping doses also puts you at risk for  problems.  Do not smoke.   Monitor your blood pressure at home as directed by your health care provider. SEEK MEDICAL CARE IF:   You think you are having a reaction to medicines taken.  You have recurrent headaches or feel dizzy.  You have swelling in your ankles.  You have trouble with your vision. SEEK IMMEDIATE MEDICAL CARE IF:  You develop a severe headache or confusion.  You have unusual weakness, numbness, or feel faint.  You have severe chest or abdominal pain.  You vomit repeatedly.  You have trouble breathing. MAKE SURE YOU:   Understand these instructions.  Will watch your condition.  Will get help right away if you are not doing well or get worse.   This information is not intended to replace advice given to you by your health care provider. Make sure you discuss any questions you have with your health care provider.   Document Released: 06/07/2005 Document Revised: 10/22/2014 Document Reviewed: 03/30/2013 Elsevier Interactive Patient Education 2016 [employer] Inc.  

## 2015-07-23 LAB — BASIC METABOLIC PANEL
BUN: 21 mg/dL (ref 6–23)
CALCIUM: 9.8 mg/dL (ref 8.4–10.5)
CHLORIDE: 95 meq/L — AB (ref 96–112)
CO2: 26 mEq/L (ref 19–32)
Creatinine, Ser: 1.61 mg/dL — ABNORMAL HIGH (ref 0.40–1.20)
GFR: 31.84 mL/min — AB (ref >60.00)
Glucose, Bld: 104 mg/dL — ABNORMAL HIGH (ref 70–99)
Potassium: 4 mEq/L (ref 3.5–5.1)
Sodium: 134 mEq/L — ABNORMAL LOW (ref 135–145)

## 2015-07-28 ENCOUNTER — Encounter: Payer: Self-pay | Admitting: Internal Medicine

## 2015-08-02 ENCOUNTER — Other Ambulatory Visit: Payer: Self-pay | Admitting: Internal Medicine

## 2015-08-18 ENCOUNTER — Other Ambulatory Visit: Payer: Self-pay | Admitting: Internal Medicine

## 2015-08-19 ENCOUNTER — Other Ambulatory Visit: Payer: Self-pay | Admitting: Internal Medicine

## 2015-08-29 ENCOUNTER — Telehealth: Payer: Self-pay | Admitting: *Deleted

## 2015-08-29 DIAGNOSIS — R058 Other specified cough: Secondary | ICD-10-CM

## 2015-08-29 DIAGNOSIS — R05 Cough: Secondary | ICD-10-CM

## 2015-08-29 NOTE — Telephone Encounter (Signed)
Left msg on triage wanting to get refill on cough syrup MD rx bck in January. The cough went away, but has came back constantly coughing can't sleep at night...Stephanie Martinez

## 2015-08-30 MED ORDER — HYDROCODONE-HOMATROPINE 5-1.5 MG/5ML PO SYRP: 5.0000 mL | ORAL_SOLUTION | Freq: Four times a day (QID) | ORAL | Status: DC | PRN

## 2015-08-30 NOTE — Telephone Encounter (Signed)
Rx written.

## 2015-09-01 NOTE — Telephone Encounter (Signed)
Called daughter no answer LMOM rx ready for pick-up.../lmb 

## 2015-09-02 ENCOUNTER — Encounter: Payer: Self-pay | Admitting: Neurology

## 2015-09-02 ENCOUNTER — Ambulatory Visit (INDEPENDENT_AMBULATORY_CARE_PROVIDER_SITE_OTHER): Payer: Medicare Other | Admitting: Neurology

## 2015-09-02 VITALS — BP 142/84 | HR 108 | Resp 14 | Wt 124.0 lb

## 2015-09-02 DIAGNOSIS — R6889 Other general symptoms and signs: Secondary | ICD-10-CM | POA: Diagnosis not present

## 2015-09-02 DIAGNOSIS — R55 Syncope and collapse: Secondary | ICD-10-CM

## 2015-09-02 DIAGNOSIS — IMO0001 Reserved for inherently not codable concepts without codable children: Secondary | ICD-10-CM

## 2015-09-02 NOTE — Progress Notes (Signed)
NEUROLOGY CONSULTATION NOTE  Stephanie Martinez MRN: [id number] DOB: 08-03-1921  Referring provider: Dr. Scarlette Calico Primary care provider: Dr. Scarlette Calico  Reason for consult:  seizures  Dear Dr Ronnald Ramp:  Thank you for your kind referral of Stephanie Martinez for consultation of the above symptoms. Although her history is well known to you, please allow me to reiterate it for the purpose of our medical record. The patient was accompanied to the clinic by her daughter who also provides collateral information. Records and images were personally reviewed where available.  HISTORY OF PRESENT ILLNESS: This is a pleasant 82 year old left-handed woman with a history of hypertension, orthostatic hypotension, breast cancer, chronic back pain, presenting for evaluation of recurrent episodes of loss of consciousness. She moved to [place] around 3-1/2 years ago, previously living alone in [place]. Her daughter is unsure of the character of the episodes while she was in [place], but had been told she would faint or pass out. Symptoms started 7-8 yrs ago. Her daughter has witnessed 6 or 7 episodes in the past 3-1/2 years, last episode was 2-1/2 months ago. She reports that her mother would tell family she is feeling cold, tries to stand and walks a few steps, then slowly goes to the ground with her body limp, her eyes were open and face was frozen, lasting less than a minute, no post-event confusion. The patient does not remember much of the episodes, but does note that she "feels different" 1-2 seconds before they happen. She would usually be nauseated and vomit after the episodes. She seems to always fall on her right side, no tongue bite. She has had only one episode where she had bowel and bladder incontinence 4 months ago. The most recent episode 2-1/2 months ago was different per daughter, this time instead of going limp, her arms were flexed up in front of her and her face was again frozen,  this lasted again less than a minute, then afterwards she asked what happened but was not significantly confused. She denies any palpitations, chest pain. She feels dizzy once in a while. She has infrequent headaches. Her daughter denies any other episodes of staring/unresponsiveness, she denies any olfactory/gustatory hallucinations, deja vu, focal numbness/tingling/weakness, myoclonic jerks. She ambulates with a walker. Her daughter reports memory is pretty good, but did notice "just a tinge" of short term memory loss after the last episode where she fell and hit her head.   Her mother also had episodes of passing out and had a stroke. She had a normal birth and early development.  There is no history of febrile convulsions, CNS infections such as meningitis/encephalitis, significant traumatic brain injury, neurosurgical procedures, or family history of seizures.  episode of aphasia and facial droop associated with dizziness on the morning of admission. Patient is a poor historian, and most of the history was available from the daughter at the bedside. patient's head is turned to the left side and reports she gets very nauseated and dizzy when she turns her head to the right side. As per the daughter, her mom started living with her from February, and since then the patient had 3 episodes of dizzy spells and episodes where she stares blankly. She also had few episodes of witnessed syncope. She had a h/o meniers disease for > 60 years. Patient denied any chest pain or palpitations. She denied tingling and numbness in her extremities. She usually ambulates using a walker. She was oriented to person and place and  not to time.   She was admitted in 2013 for an episode of aphasia and facial droop with dizziness. Her daughter reported staring spells at that time and syncopal episodes. She had a routine EEG which showed mild to moderate generalized nonspecific continuous slowing with significant slowing of the  frontal area, unorganized FIRDA was seen. I personally reviewed MRI brain done 02/16/12 which did not show any acute changes, there was mild diffuse atrophy and chronic microvascular disease. She was discharged home with a diagnosis of possible TIA. There was report of a diagnosis of Meniere's disease, and recommendation for ENT evaluation.  PAST MEDICAL HISTORY: Past Medical History  Diagnosis Date  . Glaucoma   . Arthritis   . Hyperlipidemia   . Hypertension   . Meniere disease   . GERD (gastroesophageal reflux disease)   . Breast cancer ([place])     lumpectomy and radiation  . Vertigo     doing PT   . Colon cancer ([facility])     resection  . Overactive thyroid gland     treated with liquid iodine  . Osteoporosis   . CKD (chronic kidney disease), stage III     PAST SURGICAL HISTORY: Past Surgical History  Procedure Laterality Date  . Breast surgery      lumpectomy-left  . Appendectomy    . Spine surgery      x2  . Small intestine surgery      resection  . Total hip arthroplasty      bilateral one 20 years ago, other 8-10 years ago  . Tonsillectomy and adenoidectomy  before age 64  . Orif humerus fracture Right 11/26/2014    Procedure: OPEN REDUCTION INTERNAL FIXATION (ORIF) HUMERAL SHAFT FRACTURE;  Surgeon: Altamese [place], MD;  Location: [facility];  Service: Orthopedics;  Laterality: Right;    MEDICATIONS: Current Outpatient Prescriptions on File Prior to Visit  Medication Sig Dispense Refill  . acetaminophen (TYLENOL) 500 MG tablet Take 2 tablets (1,000 mg total) by mouth every 6 (six) hours. (Patient taking differently: Take 1,000 mg by mouth every 6 (six) hours as needed. ) 90 tablet 1  . aspirin 81 MG tablet Take 81 mg by mouth daily.    . Cholecalciferol 50000 UNITS TABS Take 1 tablet by mouth once a week. 12 tablet 3  . diphenhydramine-acetaminophen (TYLENOL PM) 25-500 MG TABS tablet Take 1 tablet by mouth at bedtime as needed.    . famotidine (PEPCID) 20 MG tablet TAKE 1 TABLET  BY MOUTH DAILY AFTER SUPPER 30 tablet 0  . GLUCOSAMINE HCL PO Take 1,000 mg by mouth daily.    Marland Kitchen HYDROcodone-homatropine (HYCODAN) 5-1.5 MG/5ML syrup Take 5 mLs by mouth every 6 (six) hours as needed for cough. 240 mL 0  . latanoprost (XALATAN) 0.005 % ophthalmic solution Place 1 drop into both eyes at bedtime.     Marland Kitchen losartan-hydrochlorothiazide (HYZAAR) 100-12.5 MG per tablet Reported on 07/22/2015  2  . losartan-hydrochlorothiazide (HYZAAR) 100-12.5 MG tablet TAKE 1 TABLET BY MOUTH EVERY DAY 90 tablet 3  . LUTEIN PO Take 25 mg by mouth daily.    . pantoprazole (PROTONIX) 40 MG tablet TAKE 1 TABLET BY MOUTH EVERY DAY. TAKE 30-60 MINUTES BEFORE FIRST MEAL OF THE DAY 30 tablet 5  . vitamin C (ASCORBIC ACID) 500 MG tablet Take 500 mg by mouth daily.     No current facility-administered medications on file prior to visit.    ALLERGIES: Allergies  Allergen Reactions  . Iodine Anaphylaxis  FAMILY HISTORY: Family History  Problem Relation Age of Onset  . Stroke Mother   . Colon cancer Father     dx in 33s  . Lung cancer Sister 4  . Heart disease Neg Hx   . Hyperlipidemia Neg Hx   . Hypertension Other     materal side  . Breast cancer Paternal Aunt     <50  . BRCA 1/2 Daughter     BRCA2 positive  . BRCA 1/2 Daughter     BRCA2 positive  . Breast cancer Sister 70    bilateral breast cancer, 2nd dx at 61  . Colon cancer Sister 62  . Breast cancer Sister 37    bilateral breast cancer; 2nd dx age 73  . Leukemia Other     dx in his 76s, youngest sister's son  . Thyroid disease Daughter     SOCIAL HISTORY: Social History   Social History  . Marital Status: Widowed    Spouse Name: N/A  . Number of Children: N/A  . Years of Education: N/A   Occupational History  . Not on file.   Social History Main Topics  . Smoking status: Former Research scientist (life sciences)  . Smokeless tobacco: Never Used     Comment: in her early 49s  . Alcohol Use: 2.5 - 3.0 oz/week    5-6 drink(s) per week  . Drug  Use: No  . Sexual Activity: No   Other Topics Concern  . Not on file   Social History Narrative    REVIEW OF SYSTEMS: Constitutional: No fevers, chills, or sweats, no generalized fatigue, change in appetite Eyes: No visual changes, double vision, eye pain Ear, nose and throat: No hearing loss, ear pain, nasal congestion, sore throat Cardiovascular: No chest pain, palpitations Respiratory:  No shortness of breath at rest or with exertion, wheezes GastrointestinaI: No nausea, vomiting, diarrhea, abdominal pain, fecal incontinence Genitourinary:  No dysuria, urinary retention or frequency Musculoskeletal:  No neck pain,+ back pain Integumentary: No rash, pruritus, skin lesions Neurological: as above Psychiatric: No depression, insomnia, anxiety Endocrine: No palpitations, fatigue, diaphoresis, mood swings, change in appetite, change in weight, increased thirst Hematologic/Lymphatic:  No anemia, purpura, petechiae. Allergic/Immunologic: no itchy/runny eyes, nasal congestion, recent allergic reactions, rashes  PHYSICAL EXAM: Filed Vitals:   09/02/15 1357  BP: 142/84  Pulse: 108  Resp: 14   General: No acute distress Head:  Normocephalic, bruise under right eye from recent fall Eyes: Fundoscopic exam shows bilateral sharp discs, no vessel changes, exudates, or hemorrhages Neck: supple, no paraspinal tenderness, full range of motion Back: No paraspinal tenderness Heart: regular rate and rhythm Lungs: Clear to auscultation bilaterally. Vascular: No carotid bruits. Skin/Extremities: No rash, no edema Neurological Exam: Mental status: alert and oriented to person, place, and time, no dysarthria or aphasia, Fund of knowledge is appropriate.  Recent and remote memory are intact. 3/3 delayed recall. Attention and concentration are normal.    Able to name objects and repeat phrases. Cranial nerves: CN I: not tested CN II: pupils equal, round and reactive to light, visual fields intact,  fundi unremarkable. CN III, IV, VI:  full range of motion, no nystagmus, no ptosis CN V: facial sensation intact CN VII: upper and lower face symmetric CN VIII: hearing intact to finger rub CN IX, X: gag intact, uvula midline CN XI: sternocleidomastoid and trapezius muscles intact CN XII: tongue midline Bulk & Tone: normal, no fasciculations. Motor: 5/5 throughout with no pronator drift. Sensation: decreased cold on right LE, increased  pin on right LE. Reports decreased vibration on right LE overall.  No extinction to double simultaneous stimulation.  Romberg test negative Deep Tendon Reflexes: some asymmetry noted, +2 on right UE and LE, brisk +3 on left UE and LE, no ankle clonus, negative Hoffman sign Plantar responses: downgoing bilaterally Cerebellar: no incoordination on finger to nose, heel to shin. No dysdiadochokinesia Gait: ambulates well with rolling walker, no ataxia Tremor: none  IMPRESSION: This is a pleasant 82 year old left-handed woman with a history of hypertension, orthostatic hypotension, breast cancer, presenting for recurrent episodes of loss of consciousness for the past 7-8 years. Her daughter reports at least 2 episodes in the past 4 months, most recent episode was different, instead of falling limp to the ground, her upper body was stiff with arms flexed and face frozen. Episodes last less than a minute with no significant post-event confusion. She would report feeling very cold prior the episodes, and have nausea and vomiting after. Considerations include syncope (convulsive syncope) versus seizure. No clear epilepsy risk factors, neurological exam shows subjective decreased sensation on the right leg and asymmetric reflexes on the left. MRI brain with and without contrast will be ordered to assess for underlying structural abnormality. A 24-hour EEG will be done to further classify her symptoms. She will be referred to Cardiology for recurrent syncope. She does not  drive. She will follow-up after the tests. Safety precautions for falls were discussed.  Thank you for allowing me to participate in the care of this patient. Please do not hesitate to call for any questions or concerns.   Ellouise Newer, M.D.  CC: Dr. Ronnald Ramp

## 2015-09-02 NOTE — Patient Instructions (Addendum)
1. Schedule MRI brain with and without contrast 2. Schedule 24-hour EEG 3. Refer to Cardiology for recurrent syncope 4. Follow-up after the tests  5. YOU HAVE BEEN SCHEDULED AT [facility] FOR [facility] ON 09/16/15. PLEASE ARRIVE @ 10:30AM   [address]  [place], [place] [address]  [phone]

## 2015-09-03 DIAGNOSIS — R55 Syncope and collapse: Secondary | ICD-10-CM | POA: Insufficient documentation

## 2015-09-03 DIAGNOSIS — R6889 Other general symptoms and signs: Principal | ICD-10-CM

## 2015-09-03 DIAGNOSIS — IMO0001 Reserved for inherently not codable concepts without codable children: Secondary | ICD-10-CM | POA: Insufficient documentation

## 2015-09-10 ENCOUNTER — Telehealth: Payer: Self-pay | Admitting: Neurology

## 2015-09-10 ENCOUNTER — Other Ambulatory Visit: Payer: Medicare Other

## 2015-09-10 NOTE — Telephone Encounter (Signed)
Would reschedule when she is feeling better. Recommend calling PCP if she is still feeling unwell. Thanks

## 2015-09-10 NOTE — Telephone Encounter (Signed)
Eilleen Kempf (son in law) called to say Stephanie Martinez was difficult to get up today, she is not well and they had to cancel the appointment for her 24 hour EEG. He would like to know if they should proceed since she is now not getting up much from bed. He asked that I pass this on to Dr. Delice Lesch to decide what to do. I told him I would pass this on and we will proceed as per your decision to reschedule or not.Please advise. Thanks

## 2015-10-01 ENCOUNTER — Other Ambulatory Visit: Payer: Medicare Other

## 2015-10-07 ENCOUNTER — Telehealth: Payer: Self-pay | Admitting: Neurology

## 2015-10-07 NOTE — Telephone Encounter (Signed)
I personally reviewed MRI brain with and without contrast which did not show any acute changes. There was mild cerebral atrophy and chronic microvascular ischemic changes. Will discuss results after EEG done.

## 2015-10-14 ENCOUNTER — Other Ambulatory Visit: Payer: Self-pay | Admitting: Internal Medicine

## 2015-10-17 ENCOUNTER — Other Ambulatory Visit: Payer: Self-pay | Admitting: Internal Medicine

## 2015-10-22 ENCOUNTER — Ambulatory Visit (INDEPENDENT_AMBULATORY_CARE_PROVIDER_SITE_OTHER): Payer: Medicare Other | Admitting: Neurology

## 2015-10-22 DIAGNOSIS — IMO0001 Reserved for inherently not codable concepts without codable children: Secondary | ICD-10-CM

## 2015-10-22 DIAGNOSIS — R55 Syncope and collapse: Secondary | ICD-10-CM

## 2015-10-22 DIAGNOSIS — R6889 Other general symptoms and signs: Secondary | ICD-10-CM | POA: Diagnosis not present

## 2015-10-23 NOTE — Procedures (Signed)
24 HOUR AMBULATORY ELECTROENCEPHALOGRAM REPORT  Date of Study: 10/22/2015 to 10/23/2015  Patient's Name: Stephanie Martinez MRN: [id number] Date of Birth: 1922/01/01  Referring Provider: Ellouise Newer, MD  Indication: 82 year old woman with recurrent syncope and spells  Medications: ASA, Hyzaar, Lutein, vitamin C, Hycodan   Technical Summary: This is a multichannel digital EEG recording, using the international 10-20 placement system with electrodes applied with paste and impedances below 5000 ohms.    Description: The EEG background is symmetric, with a well-developed posterior dominant rhythm of 8-9 Hz, which is reactive to eye opening and closing.  Diffuse beta activity is seen, with a bilateral frontal preponderance.  No focal or generalized abnormalities are seen.  No focal or generalized epileptiform discharges are seen.  Stage II sleep is seen, with normal and symmetric sleep patterns.  Hyperventilation and photic stimulation were not performed.  ECG revealed normal cardiac rate and rhythm.  Impression: This is a normal 24 hour ambulatory EEG.  Marisa Hage R. Tomi Likens, DO

## 2015-10-24 ENCOUNTER — Telehealth: Payer: Self-pay | Admitting: Family Medicine

## 2015-10-24 NOTE — Telephone Encounter (Signed)
-----   Message from Pieter Partridge, DO sent at 10/24/2015  7:20 AM EDT ----- EEG is normal.  I would follow up with cardiology and then may follow up with Dr. Delice Lesch when she returns.

## 2015-10-24 NOTE — Telephone Encounter (Signed)
Spoke with patients daughter/Nancy and notified her of result & advisement. She states that they had to cancel patients 1st cardiology appt due to patient being sick, but she is going to call their office today to get that appt rescheduled. She will call us back to get patient scheduled for a f/u with Dr. Delice Lesch for when she comes back.

## 2015-10-27 ENCOUNTER — Telehealth: Payer: Self-pay | Admitting: Neurology

## 2015-10-27 NOTE — Telephone Encounter (Signed)
PT's son in law Eilleen Kempf called and wants an appointment for Stephanie Martinez but does not want to wait until July/Dawn CB# [phone]

## 2015-10-27 NOTE — Telephone Encounter (Signed)
Returned call f/u appt scheduled for 7/31 @ 2:30 pm.

## 2015-11-11 ENCOUNTER — Ambulatory Visit (INDEPENDENT_AMBULATORY_CARE_PROVIDER_SITE_OTHER): Payer: Medicare Other | Admitting: Cardiology

## 2015-11-11 ENCOUNTER — Encounter: Payer: Self-pay | Admitting: Cardiology

## 2015-11-11 VITALS — BP 160/100 | HR 100 | Ht 62.0 in | Wt 123.4 lb

## 2015-11-11 DIAGNOSIS — R55 Syncope and collapse: Secondary | ICD-10-CM | POA: Diagnosis not present

## 2015-11-11 MED ORDER — CARVEDILOL 6.25 MG PO TABS: 6.2500 mg | ORAL_TABLET | Freq: Two times a day (BID) | ORAL | Status: DC

## 2015-11-11 NOTE — Progress Notes (Signed)
Electrophysiology Office Note   Date:  11/11/2015   ID:  Stephanie Martinez, DOB 1921-11-01, MRN [id number]  PCP:  Scarlette Calico, MD  Primary Electrophysiologist:  York Valliant Meredith Leeds, MD    Chief Complaint  Patient presents with  . New Patient (Initial Visit)  . Loss of Consciousness     History of Present Illness: Stephanie Martinez is a 82 y.o. female who presents today for electrophysiology evaluation.   She has a history of hypertension, hyperlipidemia, CKG stage III. She also has orthostatic hypotension. She's been having episodes of syncope for the last 7-8 years. Her daughters witnessed 6 or 7 episodes in the last 3 and half years. The daughter reports that her mother would tell the family that she is feeling cold when tried to stand up and walk a few steps then slowly go to the ground with her body limp her up eyes (frozen lasting less than a minute with no confusion postevent. The patient does not render much of the episodes but feels different 1/82 before that happened. She is usually nauseated post episode. MRI of the brain did not show any acute changes, EEG normal.  She says that her episodes of syncope happen once or twice a month. She does not have any confusion after the episodes, but 10 minutes later becomes very nauseous and vomits. She feels well otherwise and does not have any major complaints.  Today, she denies symptoms of palpitations, chest pain, shortness of breath, orthopnea, PND, lower extremity edema, claudication, dizziness, presyncope, syncope, bleeding, or neurologic sequela. The patient is tolerating medications without difficulties and is otherwise without complaint today.    Past Medical History  Diagnosis Date  . Glaucoma   . Arthritis   . Hyperlipidemia   . Hypertension   . Meniere disease   . GERD (gastroesophageal reflux disease)   . Breast cancer ([place])     lumpectomy and radiation  . Vertigo     doing PT   . Colon cancer ([place])    resection  . Overactive thyroid gland     treated with liquid iodine  . Osteoporosis   . CKD (chronic kidney disease), stage III    Past Surgical History  Procedure Laterality Date  . Breast surgery      lumpectomy-left  . Appendectomy    . Spine surgery      x2  . Small intestine surgery      resection  . Total hip arthroplasty Bilateral     bilateral one 20 years ago, other 8-10 years ago  . Tonsillectomy and adenoidectomy  before age 78  . Orif humerus fracture Right 11/26/2014    Procedure: OPEN REDUCTION INTERNAL FIXATION (ORIF) HUMERAL SHAFT FRACTURE;  Surgeon: Altamese Pacific, MD;  Location: [facility];  Service: Orthopedics;  Laterality: Right;     Current Outpatient Prescriptions  Medication Sig Dispense Refill  . acetaminophen (TYLENOL) 500 MG tablet Take 2 tablets (1,000 mg total) by mouth every 6 (six) hours. (Patient taking differently: Take 1,000 mg by mouth every 6 (six) hours as needed. ) 90 tablet 1  . aspirin 81 MG tablet Take 81 mg by mouth daily.    . calcium-vitamin D (OSCAL WITH D) 500-200 MG-UNIT tablet Take 1 tablet by mouth daily.    . Cholecalciferol 50000 UNITS TABS Take 1 tablet by mouth once a week. 12 tablet 3  . diphenhydramine-acetaminophen (TYLENOL PM) 25-500 MG TABS tablet Take 1 tablet by mouth at bedtime as needed.    Marland Kitchen  famotidine (PEPCID) 20 MG tablet TAKE 1 TABLET BY MOUTH EVERY DAY AFTER SUPPER 30 tablet 2  . GLUCOSAMINE HCL PO Take 1,000 mg by mouth daily.    Marland Kitchen HYDROcodone-homatropine (HYCODAN) 5-1.5 MG/5ML syrup Take 5 mLs by mouth every 6 (six) hours as needed for cough. 240 mL 0  . latanoprost (XALATAN) 0.005 % ophthalmic solution Place 1 drop into both eyes at bedtime.     Marland Kitchen losartan-hydrochlorothiazide (HYZAAR) 100-12.5 MG per tablet Reported on 07/22/2015  2  . losartan-hydrochlorothiazide (HYZAAR) 100-12.5 MG tablet TAKE 1 TABLET BY MOUTH EVERY DAY 90 tablet 3  . LUTEIN PO Take 25 mg by mouth daily.    . pantoprazole (PROTONIX) 40 MG tablet  TAKE 1 TABLET BY MOUTH EVERY DAY. TAKE 30-60 MINUTES BEFORE FIRST MEAL OF THE DAY 30 tablet 5  . vitamin C (ASCORBIC ACID) 500 MG tablet Take 500 mg by mouth daily.    . Vitamin D, Ergocalciferol, (DRISDOL) 50000 units CAPS capsule TAKE ONE CAPSULE BY MOUTH EVERY WEEK 12 capsule 0  . carvedilol (COREG) 6.25 MG tablet Take 1 tablet (6.25 mg total) by mouth 2 (two) times daily. 60 tablet 2   No current facility-administered medications for this visit.    Allergies:   Iodine   Social History:  The patient  reports that she has quit smoking. Her smoking use included Cigarettes. She quit after 35 years of use. She has never used smokeless tobacco. She reports that she drinks about 3.0 - 3.6 oz of alcohol per week. She reports that she does not use illicit drugs.   Family History:  The patient's family history includes BRCA 1/2 in her daughter and daughter; Breast cancer in her paternal aunt; Breast cancer (age of onset: 71) in her sister; Breast cancer (age of onset: 42) in her sister; Colon cancer in her father; Colon cancer (age of onset: 53) in her sister; Hypertension in her other; Leukemia in her other; Lung cancer (age of onset: 70) in her sister; Stroke in her mother; Thyroid disease in her daughter. There is no history of Heart disease or Hyperlipidemia.    ROS:  Please see the history of present illness.   Otherwise, review of systems is positive for none.   All other systems are reviewed and negative.    PHYSICAL EXAM: VS:  BP 160/100 mmHg  Pulse 100  Ht _0  (1.575 m)  Wt 123 lb 6.4 oz (55.974 kg)  BMI 22.56 kg/m2 , BMI Body mass index is 22.56 kg/(m^2). GEN: Well nourished, well developed, in no acute distress HEENT: normal Neck: no JVD, carotid bruits, or masses Cardiac: RRR; no murmurs, rubs, or gallops,no edema  Respiratory:  clear to auscultation bilaterally, normal work of breathing GI: soft, nontender, nondistended, + BS MS: no deformity or atrophy Skin: warm and  dry Neuro:  Strength and sensation are intact Psych: euthymic mood, full affect  EKG:  EKG is ordered today. The ekg ordered today shows sinus rhythm, rate 100  Recent Labs: 11/14/2014: TSH 2.05 11/26/2014: ALT 15 07/22/2015: BUN 21; Creatinine, Ser 1.61*; Hemoglobin 12.6; Platelets 323.0; Potassium 4.0; Sodium 134*    Lipid Panel     Component Value Date/Time   CHOL 155 11/26/2014 0620   TRIG 102 11/26/2014 0620   HDL 33* 11/26/2014 0620   CHOLHDL 4.7 11/26/2014 0620   VLDL 20 11/26/2014 0620   LDLCALC 102* 11/26/2014 0620   LDLDIRECT 124.0 11/14/2014 1639     Wt Readings from Last 3 Encounters:  11/11/15 123 lb 6.4 oz (55.974 kg)  09/02/15 124 lb (56.246 kg)  07/22/15 126 lb (57.153 kg)      Other studies Reviewed: Additional studies/ records that were reviewed today include: 2013 TTE  Review of the above records today demonstrates:  - Left ventricle: The cavity size was normal. Wall thickness was increased in a pattern of mild LVH. There was mild focal basal hypertrophy of the septum. Systolic function was normal. The estimated ejection fraction was in the range of 55% to 60%. Regional wall motion abnormalities cannot be excluded. Doppler parameters are consistent with abnormal left ventricular relaxation (grade 1 diastolic dysfunction). - Mitral valve: Calcified annulus. - Atrial septum: There was increased thickness of the septum, consistent with lipomatous hypertrophy.   ASSESSMENT AND PLAN:  1.  Syncope: This point, it is unclear why she is having the episodes of syncope. It is possible that her syncope is due to an arrhythmia. Because of that, we Giankarlo Leamer fit her with a 30 day monitor to see if she has any arrhythmias. She does pass out roughly once a month, so hopefully we Thaily Hackworth get a recording of this.  2. Hypertension: Blood pressure is quite elevated today, as is her heart rate. We'll start her on Coreg 6.25 twice daily.    Current medicines  are reviewed at length with the patient today.   The patient does not have concerns regarding her medicines.  The following changes were made today:  Coreg 6.25 BID  Labs/ tests ordered today include:  Orders Placed This Encounter  Procedures  . Cardiac event monitor  . EKG 12-Lead     Disposition:   FU with Tawni Melkonian 6 weeks  Signed, Brunella Wileman Meredith Leeds, MD  11/11/2015 3:07 PM     [facility] [address] [place] [address] [phone] (office) [phone] (fax)

## 2015-11-11 NOTE — Patient Instructions (Signed)
Medication Instructions:  Your physician has recommended you make the following change in your medication:  1) START Carvedilol 6.25 mg twice a day  Labwork: None  ordered  Testing/Procedures: Your physician has recommended that you wear an event monitor. Event monitors are medical devices that record the heart's electrical activity. Doctors most often [place] these monitors to diagnose arrhythmias. Arrhythmias are problems with the speed or rhythm of the heartbeat. The monitor is a small, portable device. You can wear one while you do your normal daily activities. This is usually used to diagnose what is causing palpitations/syncope (passing out).  Follow-Up: Your physician recommends that you schedule a follow-up appointment in: 6 weeks with Dr. Curt Bears  (after event monitor is completed)  If you need a refill on your cardiac medications before your next appointment, please call your pharmacy.Thank you for choosing [facility]!!   Trinidad Curet, RN [phone]   Any Other Special Instructions Will Be Listed Below (If Applicable). Cardiac Event Monitoring A cardiac event monitor is a small recording device used to help detect abnormal heart rhythms (arrhythmias). The monitor is used to record heart rhythm when noticeable symptoms such as the following occur:  Fast heartbeats (palpitations), such as heart racing or fluttering.  Dizziness.  Fainting or light-headedness.  Unexplained weakness. The monitor is wired to two electrodes placed on your chest. Electrodes are flat, sticky disks that attach to your skin. The monitor can be worn for up to 30 days. You will wear the monitor at all times, except when bathing.  HOW TO USE YOUR CARDIAC EVENT MONITOR A technician will prepare your chest for the electrode placement. The technician will show you how to place the electrodes, how to work the monitor, and how to replace the batteries. Take time to practice using the monitor before you  leave the office. Make sure you understand how to send the information from the monitor to your health care provider. This requires a telephone with a landline, not a cell phone. You need to:  Wear your monitor at all times, except when you are in water:  Do not get the monitor wet.  Take the monitor off when bathing. Do not swim or use a hot tub with it on.  Keep your skin clean. Do not put body lotion or moisturizer on your chest.  Change the electrodes daily or any time they stop sticking to your skin. You might need to use tape to keep them on.  It is possible that your skin under the electrodes could become irritated. To keep this from happening, try to put the electrodes in slightly different places on your chest. However, they must remain in the area under your left breast and in the upper right section of your chest.  Make sure the monitor is safely clipped to your clothing or in a location close to your body that your health care provider recommends.  Press the button to record when you feel symptoms of heart trouble, such as dizziness, weakness, light-headedness, palpitations, thumping, shortness of breath, unexplained weakness, or a fluttering or racing heart. The monitor is always on and records what happened slightly before you pressed the button, so do not worry about being too late to get good information.  Keep a diary of your activities, such as walking, doing chores, and taking medicine. It is especially important to note what you were doing when you pushed the button to record your symptoms. This will help your health care provider determine what  might be contributing to your symptoms. The information stored in your monitor will be reviewed by your health care provider alongside your diary entries.  Send the recorded information as recommended by your health care provider. It is important to understand that it will take some time for your health care provider to process the  results.  Change the batteries as recommended by your health care provider. SEEK IMMEDIATE MEDICAL CARE IF:   You have chest pain.  You have extreme difficulty breathing or shortness of breath.  You develop a very fast heartbeat that persists.  You develop dizziness that does not go away.  You faint or constantly feel you are about to faint.   This information is not intended to replace advice given to you by your health care provider. Make sure you discuss any questions you have with your health care provider.   Document Released: 03/16/2008 Document Revised: 06/28/2014 Document Reviewed: 12/04/2012 Elsevier Interactive Patient Education [employer].

## 2015-11-12 ENCOUNTER — Ambulatory Visit (INDEPENDENT_AMBULATORY_CARE_PROVIDER_SITE_OTHER): Payer: Medicare Other

## 2015-11-12 DIAGNOSIS — R55 Syncope and collapse: Secondary | ICD-10-CM | POA: Diagnosis not present

## 2015-12-29 ENCOUNTER — Encounter: Payer: Self-pay | Admitting: Cardiology

## 2015-12-29 ENCOUNTER — Ambulatory Visit (INDEPENDENT_AMBULATORY_CARE_PROVIDER_SITE_OTHER): Payer: Medicare Other | Admitting: Cardiology

## 2015-12-29 VITALS — BP 180/110 | HR 116 | Ht 62.0 in | Wt 122.6 lb

## 2015-12-29 DIAGNOSIS — R55 Syncope and collapse: Secondary | ICD-10-CM | POA: Diagnosis not present

## 2015-12-29 MED ORDER — AMLODIPINE BESYLATE 10 MG PO TABS: 10.0000 mg | ORAL_TABLET | Freq: Every day | ORAL | Status: DC

## 2015-12-29 MED ORDER — CARVEDILOL 12.5 MG PO TABS: 12.5000 mg | ORAL_TABLET | Freq: Two times a day (BID) | ORAL | Status: DC

## 2015-12-29 NOTE — Progress Notes (Signed)
Electrophysiology Office Note   Date:  12/29/2015   ID:  SALONI LABLANC, DOB [birth year], MRN [id number]  PCP:  Scarlette Calico, MD  Primary Electrophysiologist:  Eunique Balik Meredith Leeds, MD    Chief Complaint  Patient presents with  . Follow-up    6 weeks     History of Present Illness: Stephanie Martinez is a 82 y.o. female who presents today for electrophysiology evaluation.   She has a history of hypertension, hyperlipidemia, CKG stage III. She also has orthostatic hypotension. She's been having episodes of syncope for the last 7-8 years. Her daughters witnessed 6 or 7 episodes in the last 3 and half years. The daughter reports that her mother would tell the family that she is feeling cold when tried to stand up and walk a few steps then slowly go to the ground with her body limp her up eyes (frozen lasting less than a minute with no confusion postevent. The patient does not render much of the episodes but feels different 1/82 before that happened. She is usually nauseated post episode. MRI of the brain did not show any acute changes, EEG normal.  She wore a event monitor for 30 days which showed no evidence of arrhythmia. She says that she is feeling well without any major complaint. A few days ago, she was getting out of the car and didn't get dizzy and felt poorly for about 5 minutes but when she sat down for a while she felt much better.  Today, she denies symptoms of palpitations, chest pain, shortness of breath, orthopnea, PND, lower extremity edema, claudication, dizziness, presyncope, syncope, bleeding, or neurologic sequela. The patient is tolerating medications without difficulties and is otherwise without complaint today.    Past Medical History  Diagnosis Date  . Glaucoma   . Arthritis   . Hyperlipidemia   . Hypertension   . Meniere disease   . GERD (gastroesophageal reflux disease)   . Breast cancer ([place])     lumpectomy and radiation  . Vertigo     doing PT   .  Colon cancer ([place])     resection  . Overactive thyroid gland     treated with liquid iodine  . Osteoporosis   . CKD (chronic kidney disease), stage III    Past Surgical History  Procedure Laterality Date  . Breast surgery      lumpectomy-left  . Appendectomy    . Spine surgery      x2  . Small intestine surgery      resection  . Total hip arthroplasty Bilateral     bilateral one 20 years ago, other 8-10 years ago  . Tonsillectomy and adenoidectomy  before age 52  . Orif humerus fracture Right 11/26/2014    Procedure: OPEN REDUCTION INTERNAL FIXATION (ORIF) HUMERAL SHAFT FRACTURE;  Surgeon: Altamese [place], MD;  Location: [facility];  Service: Orthopedics;  Laterality: Right;     Current Outpatient Prescriptions  Medication Sig Dispense Refill  . acetaminophen (TYLENOL) 500 MG tablet Take 500 mg by mouth every 6 (six) hours as needed.    Marland Kitchen aspirin 81 MG tablet Take 81 mg by mouth daily.    . calcium-vitamin D (OSCAL WITH D) 500-200 MG-UNIT tablet Take 1 tablet by mouth daily.    . carvedilol (COREG) 6.25 MG tablet Take 1 tablet (6.25 mg total) by mouth 2 (two) times daily. 60 tablet 2  . Cholecalciferol 50000 UNITS TABS Take 1 tablet by mouth once a week. 12 tablet  3  . diphenhydramine-acetaminophen (TYLENOL PM) 25-500 MG TABS tablet Take 1 tablet by mouth at bedtime as needed.    . famotidine (PEPCID) 20 MG tablet TAKE 1 TABLET BY MOUTH EVERY DAY AFTER SUPPER 30 tablet 2  . GLUCOSAMINE HCL PO Take 1,000 mg by mouth daily.    Marland Kitchen HYDROcodone-homatropine (HYCODAN) 5-1.5 MG/5ML syrup Take 5 mLs by mouth every 6 (six) hours as needed for cough. 240 mL 0  . latanoprost (XALATAN) 0.005 % ophthalmic solution Place 1 drop into both eyes at bedtime.     Marland Kitchen losartan-hydrochlorothiazide (HYZAAR) 100-12.5 MG per tablet Reported on 07/22/2015  2  . losartan-hydrochlorothiazide (HYZAAR) 100-12.5 MG tablet TAKE 1 TABLET BY MOUTH EVERY DAY 90 tablet 3  . LUTEIN PO Take 25 mg by mouth daily.    .  pantoprazole (PROTONIX) 40 MG tablet TAKE 1 TABLET BY MOUTH EVERY DAY. TAKE 30-60 MINUTES BEFORE FIRST MEAL OF THE DAY 30 tablet 5  . vitamin C (ASCORBIC ACID) 500 MG tablet Take 500 mg by mouth daily.    . Vitamin D, Ergocalciferol, (DRISDOL) 50000 units CAPS capsule TAKE ONE CAPSULE BY MOUTH EVERY WEEK 12 capsule 0   No current facility-administered medications for this visit.    Allergies:   Iodine   Social History:  The patient  reports that she has quit smoking. Her smoking use included Cigarettes. She quit after 35 years of use. She has never used smokeless tobacco. She reports that she drinks about 3.0 - 3.6 oz of alcohol per week. She reports that she does not use illicit drugs.   Family History:  The patient's family history includes BRCA 1/2 in her daughter and daughter; Breast cancer in her paternal aunt; Breast cancer (age of onset: 51) in her sister; Breast cancer (age of onset: 67) in her sister; Colon cancer in her father; Colon cancer (age of onset: 71) in her sister; Hypertension in her other; Leukemia in her other; Lung cancer (age of onset: 52) in her sister; Stroke in her mother; Thyroid disease in her daughter. There is no history of Heart disease or Hyperlipidemia.    ROS:  Please see the history of present illness.   Otherwise, review of systems is positive for none.   All other systems are reviewed and negative.    PHYSICAL EXAM: VS:  BP 180/110 mmHg  Pulse 116  Ht '5\' 2"'$  (1.575 m)  Wt 122 lb 9.6 oz (55.611 kg)  BMI 22.42 kg/m2 , BMI Body mass index is 22.42 kg/(m^2). GEN: Well nourished, well developed, in no acute distress HEENT: normal Neck: no JVD, carotid bruits, or masses Cardiac: tachycardic, regular; no murmurs, rubs, or gallops,no edema  Respiratory:  clear to auscultation bilaterally, normal work of breathing GI: soft, nontender, nondistended, + BS MS: no deformity or atrophy Skin: warm and dry Neuro:  Strength and sensation are intact Psych: euthymic  mood, full affect  EKG:  EKG is not ordered today.  Recent Labs: 07/22/2015: BUN 21; Creatinine, Ser 1.61*; Hemoglobin 12.6; Platelets 323.0; Potassium 4.0; Sodium 134*    Lipid Panel     Component Value Date/Time   CHOL 155 11/26/2014 0620   TRIG 102 11/26/2014 0620   HDL 33* 11/26/2014 0620   CHOLHDL 4.7 11/26/2014 0620   VLDL 20 11/26/2014 0620   LDLCALC 102* 11/26/2014 0620   LDLDIRECT 124.0 11/14/2014 1639     Wt Readings from Last 3 Encounters:  12/29/15 122 lb 9.6 oz (55.611 kg)  11/11/15 123 lb 6.4  oz (55.974 kg)  09/02/15 124 lb (56.246 kg)      Other studies Reviewed: Additional studies/ records that were reviewed today include: 2013 TTE  Review of the above records today demonstrates:  - Left ventricle: The cavity size was normal. Wall thickness was increased in a pattern of mild LVH. There was mild focal basal hypertrophy of the septum. Systolic function was normal. The estimated ejection fraction was in the range of 55% to 60%. Regional wall motion abnormalities cannot be excluded. Doppler parameters are consistent with abnormal left ventricular relaxation (grade 1 diastolic dysfunction). - Mitral valve: Calcified annulus. - Atrial septum: There was increased thickness of the septum, consistent with lipomatous hypertrophy.  30 day monitor Sinus rhythm No evidence of arrhythmia  ASSESSMENT AND PLAN:  1.  Syncope: At this time unknown cause. Cardiac monitor showed no evidence of arrhythmia.  2. Hypertension: Blood pressure is quite elevated today, as is her heart rate. Is currently on Hyzaar and Coreg. Blood pressure remains quite elevated with an elevated heart rate as well. We'll increase her Coreg to 12.5 mg twice daily and add 10 mg of amlodipine. I have asked her to get a blood pressure cuff and check her blood pressures at home as well.    Current medicines are reviewed at length with the patient today.   The patient does not  have concerns regarding her medicines.  The following changes were made today:  Coreg 12.5 BID, amlodipine 10 mg  Labs/ tests ordered today include:  No orders of the defined types were placed in this encounter.     Disposition:   FU with Rocko Fesperman 3 months   Signed, Chirsty Armistead Meredith Leeds, MD  12/29/2015 2:52 PM     [facility] [address] [place] [address] [phone] (office) [phone] (fax)

## 2015-12-29 NOTE — Patient Instructions (Signed)
Medication Instructions:  Your physician has recommended you make the following change in your medication:  1) INCREASE Carvedilol to 12.5 mg twice a day 2) START Amlodipine 10 mg once daily  Labwork: None ordered  Testing/Procedures: None ordered  Follow-Up: Your physician wants you to follow-up in: 3 months with Dr. Curt Bears. You will receive a reminder letter in the mail two months in advance. If you don't receive a letter, please call our office to schedule the follow-up appointment.  If you need a refill on your cardiac medications before your next appointment, please call your pharmacy.  Thank you for choosing [facility]!!   Trinidad Curet, RN [phone]  Any Other Special Instructions Will Be Listed Below (If Applicable).  Amlodipine tablets What is this medicine? AMLODIPINE (am LOE di peen) is a calcium-channel blocker. It affects the amount of calcium found in your heart and muscle cells. This relaxes your blood vessels, which can reduce the amount of work the heart has to do. This medicine is used to lower high blood pressure. It is also used to prevent chest pain. This medicine may be used for other purposes; ask your health care provider or pharmacist if you have questions. What should I tell my health care provider before I take this medicine? They need to know if you have any of these conditions: -heart problems like heart failure or aortic stenosis -liver disease -an unusual or allergic reaction to amlodipine, other medicines, foods, dyes, or preservatives -pregnant or trying to get pregnant -breast-feeding How should I use this medicine? Take this medicine by mouth with a glass of water. Follow the directions on the prescription label. Take your medicine at regular intervals. Do not take more medicine than directed. Talk to your pediatrician regarding the use of this medicine in children. Special care may be needed. This medicine has been used in children as  young as 6. Persons over 44 years old may have a stronger reaction to this medicine and need smaller doses. Overdosage: If you think you have taken too much of this medicine contact a poison control center or emergency room at once. NOTE: This medicine is only for you. Do not share this medicine with others. What if I miss a dose? If you miss a dose, take it as soon as you can. If it is almost time for your next dose, take only that dose. Do not take double or extra doses. What may interact with this medicine? -herbal or dietary supplements -local or general anesthetics -medicines for high blood pressure -medicines for prostate problems -rifampin This list may not describe all possible interactions. Give your health care provider a list of all the medicines, herbs, non-prescription drugs, or dietary supplements you use. Also tell them if you smoke, drink alcohol, or use illegal drugs. Some items may interact with your medicine. What should I watch for while using this medicine? Visit your doctor or health care professional for regular check ups. Check your blood pressure and pulse rate regularly. Ask your health care professional what your blood pressure and pulse rate should be, and when you should contact him or her. This medicine may make you feel confused, dizzy or lightheaded. Do not drive, use machinery, or do anything that needs mental alertness until you know how this medicine affects you. To reduce the risk of dizzy or fainting spells, do not sit or stand up quickly, especially if you are an older patient. Avoid alcoholic drinks; they can make you more dizzy. Do  not suddenly stop taking amlodipine. Ask your doctor or health care professional how you can gradually reduce the dose. What side effects may I notice from receiving this medicine? Side effects that you should report to your doctor or health care professional as soon as possible: -allergic reactions like skin rash, itching or  hives, swelling of the face, lips, or tongue -breathing problems -changes in vision or hearing -chest pain -fast, irregular heartbeat -swelling of legs or ankles Side effects that usually do not require medical attention (report to your doctor or health care professional if they continue or are bothersome): -dry mouth -facial flushing -nausea, vomiting -stomach gas, pain -tired, weak -trouble sleeping This list may not describe all possible side effects. Call your doctor for medical advice about side effects. You may report side effects to FDA at 1-800-FDA-1088. Where should I keep my medicine? Keep out of the reach of children. Store at room temperature between 59 and 86 degrees F (15 and 30 degrees C). Protect from light. Keep container tightly closed. Throw away any unused medicine after the expiration date. NOTE: This sheet is a summary. It may not cover all possible information. If you have questions about this medicine, talk to your doctor, pharmacist, or health care provider.    2016, Elsevier/[employer] Standard. (2012-05-05 11:40:58)

## 2016-01-03 ENCOUNTER — Other Ambulatory Visit: Payer: Self-pay | Admitting: Internal Medicine

## 2016-01-05 ENCOUNTER — Other Ambulatory Visit: Payer: Self-pay | Admitting: Internal Medicine

## 2016-01-12 ENCOUNTER — Telehealth: Payer: Self-pay | Admitting: *Deleted

## 2016-01-12 ENCOUNTER — Telehealth: Payer: Self-pay | Admitting: Emergency Medicine

## 2016-01-12 DIAGNOSIS — N63 Unspecified lump in unspecified breast: Secondary | ICD-10-CM

## 2016-01-12 NOTE — Telephone Encounter (Signed)
Do you mean lump, and lumpectomy? Which side? What is the prednisone for?

## 2016-01-12 NOTE — Telephone Encounter (Signed)
Yes lump and lumpectomy

## 2016-01-12 NOTE — Telephone Encounter (Signed)
daughther called with questions about carvedilol and how her mother should take it, 2 pills at one time or split it up, will route to [employer]

## 2016-01-12 NOTE — Telephone Encounter (Signed)
Pts daughter called and stated pt found a lymph in the breast she had a lymphectomy in. Would like to get a referral to the breast center for a mammogram. Also asked if she could get a prescription for Prednisone. Pharmacy is [address]. Please follow up thanks.

## 2016-01-12 NOTE — Telephone Encounter (Signed)
Advised dtr that pt should take Carvedilol 2 times per day - once in a.m. and once in p.m. She verbalized that this was her thinking and wanted to make sure she instructed mom right. She thanks me for calling back.

## 2016-01-13 NOTE — Telephone Encounter (Signed)
Added order and added dx. Please review and sign if you agree.

## 2016-01-15 NOTE — Telephone Encounter (Signed)
Pts daughter called again. She is asking about the referral to the breast center for the lump in the left breast which is the breast she had the lumpectomy in. Also she was wondering if she can get a prescription for prednisone for pts upper respiratory cough. Pharmacy is [facility] on [address]. Please follow up thanks.

## 2016-01-15 NOTE — Telephone Encounter (Signed)
She will have to be seen to document the lump in her breast, my experience has been the breast Center will not do any studies if they don't have documentation of the location and size of the breast mass. If her cough is bad enough to need steroids then she should be checked out.

## 2016-01-15 NOTE — Telephone Encounter (Signed)
Patients daughter is aware

## 2016-01-16 ENCOUNTER — Telehealth: Payer: Self-pay | Admitting: Obstetrics & Gynecology

## 2016-01-16 NOTE — Telephone Encounter (Signed)
Patient's daughter, Lyzbeth Moisa (DPR on file to share PHI), called and left a message on the answering machine at lunch requesting an appointment for her mom with Dr. Sabra Heck for "thickness in her left breast."   Last seen 11/03/15.

## 2016-01-16 NOTE — Telephone Encounter (Signed)
Spoke with Truett Mainland and she states that her Mother has a history of breast cancer and she has noticed some changes in her L Breast.  Would like to plan for diagnostic imaging. Declines office visit offered for today and Monday. Scheduled office visit for Tuesday 01/20/16 at 1430. She will call back with any changes or concerns.  Routing to provider for final review. Patient agreeable to disposition. Will close encounter.

## 2016-01-19 ENCOUNTER — Ambulatory Visit (INDEPENDENT_AMBULATORY_CARE_PROVIDER_SITE_OTHER): Payer: Medicare Other | Admitting: Neurology

## 2016-01-19 ENCOUNTER — Encounter: Payer: Self-pay | Admitting: Neurology

## 2016-01-19 VITALS — BP 138/80 | HR 89 | Ht 62.0 in | Wt 125.0 lb

## 2016-01-19 DIAGNOSIS — R55 Syncope and collapse: Secondary | ICD-10-CM

## 2016-01-19 NOTE — Progress Notes (Signed)
NEUROLOGY FOLLOW UP OFFICE NOTE  Stephanie Martinez [id number]  HISTORY OF PRESENT ILLNESS: I had the pleasure of seeing Stephanie Martinez in follow-up in the neurology clinic on 01/19/2016.  The patient was last seen 3 months ago for recurrent episodes of loss of consciousness.  Records and images were personally reviewed where available. I personally reviewed MRI brain with and without contrast which did not show any acute changes, there was mild diffuse atrophy and chronic microvascular disease. Her 24-hour EEG was normal, typical events were not captured. She has also been kindly seen by Cardiology, echocardiogram showed mild LVH, EF 69-67%, grade 1 diastolic dysfunction. She had a 30-day monitor with no evidence of arrhythmia. Since her last visit, she denies any further syncopal episodes since January 2017. She has had a few times where she felt she may have something happen, but would not progress. She denies any falls. She denies any headaches, vision changes, focal weakness. She has occasional numbness in her legs. She had some ankle swelling last week that is improved.   HPI 09/02/15: This is a pleasant 82 yo LH woman with a history of hypertension, orthostatic hypotension, breast cancer, chronic back pain, with recurrent episodes of loss of consciousness. She moved to [place] around 3-1/2 years ago, previously living alone in [place]. Her daughter is unsure of the character of the episodes while she was in [place], but had been told she would faint or pass out. Symptoms started 7-8 yrs ago. Her daughter has witnessed 6 or 7 episodes in the past 3-1/2 years, last episode was 2-1/2 months ago. She reports that her mother would tell family she is feeling cold, tries to stand and walks a few steps, then slowly goes to the ground with her body limp, her eyes were open and face was frozen, lasting less than a minute, no post-event confusion. The patient does not remember much of the episodes,  but does note that she "feels different" 1-2 seconds before they happen. She would usually be nauseated and vomit after the episodes. She seems to always fall on her right side, no tongue bite. She has had only one episode where she had bowel and bladder incontinence 4 months ago. The most recent episode 2-1/2 months ago was different per daughter, this time instead of going limp, her arms were flexed up in front of her and her face was again frozen, this lasted again less than a minute, then afterwards she asked what happened but was not significantly confused. She denies any palpitations, chest pain. She feels dizzy once in a while. She has infrequent headaches. Her daughter denies any other episodes of staring/unresponsiveness, she denies any olfactory/gustatory hallucinations, deja vu, focal numbness/tingling/weakness, myoclonic jerks. She ambulates with a walker. Her daughter reports memory is pretty good, but did notice "just a tinge" of short term memory loss after the last episode where she fell and hit her head.   Her mother also had episodes of passing out and had a stroke. She had a normal birth and early development.  There is no history of febrile convulsions, CNS infections such as meningitis/encephalitis, significant traumatic brain injury, neurosurgical procedures, or family history of seizures.  She was admitted in 2013 for an episode of aphasia and facial droop with dizziness. Her daughter reported staring spells at that time and syncopal episodes. She had a routine EEG which showed mild to moderate generalized nonspecific continuous slowing with significant slowing of the frontal area, unorganized FIRDA was seen. I personally  reviewed MRI brain done 02/16/12 which did not show any acute changes, there was mild diffuse atrophy and chronic microvascular disease. She was discharged home with a diagnosis of possible TIA. There was report of a diagnosis of Meniere's disease, and recommendation for  ENT evaluation.  PAST MEDICAL HISTORY: Past Medical History:  Diagnosis Date  . Arthritis   . Breast cancer ([place])    lumpectomy and radiation  . CKD (chronic kidney disease), stage III   . Colon cancer ([place])    resection  . GERD (gastroesophageal reflux disease)   . Glaucoma   . Hyperlipidemia   . Hypertension   . Meniere disease   . Osteoporosis   . Overactive thyroid gland    treated with liquid iodine  . Vertigo    doing PT     MEDICATIONS: Current Outpatient Prescriptions on File Prior to Visit  Medication Sig Dispense Refill  . acetaminophen (TYLENOL) 500 MG tablet Take 500 mg by mouth every 6 (six) hours as needed.    Marland Kitchen amLODipine (NORVASC) 10 MG tablet Take 1 tablet (10 mg total) by mouth daily. 30 tablet 3  . aspirin 81 MG tablet Take 81 mg by mouth daily.    . calcium-vitamin D (OSCAL WITH D) 500-200 MG-UNIT tablet Take 1 tablet by mouth daily.    . carvedilol (COREG) 12.5 MG tablet Take 1 tablet (12.5 mg total) by mouth 2 (two) times daily. 60 tablet 3  . Cholecalciferol 50000 UNITS TABS Take 1 tablet by mouth once a week. 12 tablet 3  . diphenhydramine-acetaminophen (TYLENOL PM) 25-500 MG TABS tablet Take 1 tablet by mouth at bedtime as needed.    . famotidine (PEPCID) 20 MG tablet TAKE 1 TABLET BY MOUTH EVERY DAY AFTER SUPPER 30 tablet 0  . GLUCOSAMINE HCL PO Take 1,000 mg by mouth daily.    Marland Kitchen HYDROcodone-homatropine (HYCODAN) 5-1.5 MG/5ML syrup Take 5 mLs by mouth every 6 (six) hours as needed for cough. 240 mL 0  . latanoprost (XALATAN) 0.005 % ophthalmic solution Place 1 drop into both eyes at bedtime.     Marland Kitchen losartan-hydrochlorothiazide (HYZAAR) 100-12.5 MG per tablet Reported on 07/22/2015  2  . losartan-hydrochlorothiazide (HYZAAR) 100-12.5 MG tablet TAKE 1 TABLET BY MOUTH EVERY DAY 90 tablet 3  . LUTEIN PO Take 25 mg by mouth daily.    . pantoprazole (PROTONIX) 40 MG tablet TAKE 1 TABLET BY MOUTH EVERY DAY. TAKE 30-60 MINUTES BEFORE FIRST MEAL OF THE DAY 30  tablet 5  . vitamin C (ASCORBIC ACID) 500 MG tablet Take 500 mg by mouth daily.    . Vitamin D, Ergocalciferol, (DRISDOL) 50000 units CAPS capsule TAKE ONE CAPSULE BY MOUTH EVERY WEEK 12 capsule 0   No current facility-administered medications on file prior to visit.     ALLERGIES: Allergies  Allergen Reactions  . Iodine Anaphylaxis    FAMILY HISTORY: Family History  Problem Relation Age of Onset  . Stroke Mother   . Colon cancer Father     dx in 44s  . Lung cancer Sister 39  . Heart disease Neg Hx   . Hyperlipidemia Neg Hx   . Hypertension Other     materal side  . Breast cancer Paternal Aunt     <50  . BRCA 1/2 Daughter     BRCA2 positive  . BRCA 1/2 Daughter     BRCA2 positive  . Breast cancer Sister 78    bilateral breast cancer, 2nd dx at 60  . Colon  cancer Sister 10  . Breast cancer Sister 33    bilateral breast cancer; 2nd dx age 13  . Leukemia Other     dx in his 79s, youngest sister's son  . Thyroid disease Daughter     SOCIAL HISTORY: Social History   Social History  . Marital status: Widowed    Spouse name: N/A  . Number of children: 2  . Years of education: N/A   Occupational History  . Not on file.   Social History Main Topics  . Smoking status: Former Smoker    Years: 35.00    Types: Cigarettes  . Smokeless tobacco: Never Used     Comment: in her early 67s  . Alcohol use 3.0 - 3.6 oz/week    5 - 6 Standard drinks or equivalent per week     Comment: Wine  . Drug use: No  . Sexual activity: No   Other Topics Concern  . Not on file   Social History Narrative  . No narrative on file    REVIEW OF SYSTEMS: Constitutional: No fevers, chills, or sweats, no generalized fatigue, change in appetite Eyes: No visual changes, double vision, eye pain Ear, nose and throat: No hearing loss, ear pain, nasal congestion, sore throat Cardiovascular: No chest pain, palpitations Respiratory:  No shortness of breath at rest or with exertion,  wheezes GastrointestinaI: No nausea, vomiting, diarrhea, abdominal pain, fecal incontinence Genitourinary:  No dysuria, urinary retention or frequency Musculoskeletal:  No neck pain, back pain Integumentary: No rash, pruritus, skin lesions Neurological: as above Psychiatric: No depression, insomnia, anxiety Endocrine: No palpitations, fatigue, diaphoresis, mood swings, change in appetite, change in weight, increased thirst Hematologic/Lymphatic:  No anemia, purpura, petechiae. Allergic/Immunologic: no itchy/runny eyes, nasal congestion, recent allergic reactions, rashes  PHYSICAL EXAM: Vitals:   01/19/16 1443  BP: 138/80  Pulse: 89   General: No acute distress Head:  Normocephalic/atraumatic Neck: supple, no paraspinal tenderness, full range of motion Heart:  Regular rate and rhythm Lungs:  Clear to auscultation bilaterally Back: No paraspinal tenderness Skin/Extremities: No rash, no edema Neurological Exam: alert and oriented to person, place, and time, no dysarthria or aphasia, Fund of knowledge is appropriate.  Recent and remote memory are intact. Attention and concentration are normal.    Able to name objects and repeat phrases. Cranial nerves: CN I: not tested CN II: pupils equal, round and reactive to light, visual fields intact CN III, IV, VI:  full range of motion, no nystagmus, no ptosis CN V: facial sensation intact CN VII: upper and lower face symmetric CN VIII: hearing intact to finger rub CN IX, X: gag intact, uvula midline CN XI: sternocleidomastoid and trapezius muscles intact CN XII: tongue midline Bulk & Tone: normal, no fasciculations. Motor: 5/5 throughout with no pronator drift. Sensation: intact to light touch  Deep Tendon Reflexes: some asymmetry noted, +2 on right UE and LE, brisk +3 on left UE and LE (similar to priro) Plantar responses: downgoing bilaterally Cerebellar: no incoordination on finger to nose testing Gait: ambulates well with rolling  walker, no ataxia Tremor: none  IMPRESSION: This is a pleasant 82 yo LH  woman with a history of hypertension, orthostatic hypotension, breast cancer, with recurrent episodes of loss of consciousness for the past 7-8 years. Episodes last less than a minute with no significant post-event confusion. She would report feeling very cold prior the episodes, and have nausea and vomiting after. No syncopal spells since January 2017. She has had a Cardiology evaluation with  unremarkable echocardiogram and no arrhythmia seen on 30-day monitor. Symptoms suggestive of syncope rather than seizure. No clear epilepsy risk factors, MRI brain and 24-hour EEG unremarkable. She was advised to increase fluid intake, continue follow-up with Cardiology. She does not drive. She will follow-up on a prn basis and knows to call our office for any changes.   Thank you for allowing me to participate in her care.  Please do not hesitate to call for any questions or concerns.  The duration of this appointment visit was 15 minutes of face-to-face time with the patient.  Greater than 50% of this time was spent in counseling, explanation of diagnosis, planning of further management, and coordination of care.   Ellouise Newer, M.D.   CC: Dr. Scarlette Calico

## 2016-01-19 NOTE — Patient Instructions (Signed)
1. Continue with hydration 2. Continue follow-up with Cardiology and blood pressure control 3. Follow-up on as needed basis, call for any changes

## 2016-01-20 ENCOUNTER — Ambulatory Visit (INDEPENDENT_AMBULATORY_CARE_PROVIDER_SITE_OTHER): Payer: Medicare Other | Admitting: Obstetrics & Gynecology

## 2016-01-20 VITALS — BP 138/80 | HR 80 | Ht 62.0 in | Wt 125.0 lb

## 2016-01-20 DIAGNOSIS — R05 Cough: Secondary | ICD-10-CM

## 2016-01-20 DIAGNOSIS — N63 Unspecified lump in breast: Secondary | ICD-10-CM | POA: Diagnosis not present

## 2016-01-20 DIAGNOSIS — K649 Unspecified hemorrhoids: Secondary | ICD-10-CM | POA: Diagnosis not present

## 2016-01-20 DIAGNOSIS — N6321 Unspecified lump in the left breast, upper outer quadrant: Secondary | ICD-10-CM

## 2016-01-20 DIAGNOSIS — R053 Chronic cough: Secondary | ICD-10-CM

## 2016-01-20 MED ORDER — HYDROCORTISONE 2.5 % RE CREA: 1.0000 "application " | TOPICAL_CREAM | Freq: Three times a day (TID) | RECTAL | 1 refills | Status: DC

## 2016-01-20 NOTE — Progress Notes (Signed)
GYNECOLOGY  VISIT   HPI: 82 y.o. G2P2 Widowed Caucasian female for three issues.    First, she has a skin lesion on her breast that she would like me to look at today.  She describes it as non tender and feeling a little "firm".  No nipple discharge.  Second, she's having more issues with hemorrhoids. She thinks it is due to the increased coughing.  She does have constipation at times.  Denies rectal bleeding.  Using OTC preparation H cream.  Third, she is having a chronic cough.  She has been evaluated by her PCP, treated with anti-tussives after a chest x ray was normal.  This helped for a short while.  Then she was treated with inhaled steroids but this has been a problem as she has trouble using the inhaler correctly.  She is on reflux medication and has also seen a pulmonologist, Dr. Melvyn Novas.  Pt's daughter is requesting "steroids" for her mother.  Reviewed Up To Date evaluation process/treatment process for chronic cough.  This has been done correctly.  There is no role for antibiotics or oral steroids.  Daughter voices understanding and will follow up with Dr. Melvyn Novas.  GYNECOLOGIC HISTORY: No LMP recorded. Patient is postmenopausal. Menopausal hormone therapy: none   Patient Active Problem List   Diagnosis Date Noted  . Spells ([place]) 09/03/2015  . Faintness 09/03/2015  . Seizures ([place]) 07/22/2015  . Upper airway cough syndrome 05/23/2015  . Vitamin D deficiency 12/05/2014  . Orthostatic hypotension 11/27/2014  . CKD (chronic kidney disease), stage III   . Allergic rhinitis, cause unspecified 01/22/2013  . COPD bronchitis 01/05/2013  . Other abnormal glucose 09/18/2012  . Spondylosis of lumbar region without myelopathy or radiculopathy 07/27/2012  . Low back pain 04/26/2012  . Meniere disease   . GERD (gastroesophageal reflux disease)   . Osteoarthritis of hip   . Breast cancer ([place])   . Eczema 12/29/2011  . Essential hypertension, benign 08/27/2011  . Other screening mammogram  08/27/2011  . Pure hypercholesterolemia 08/27/2011  . Glaucoma 08/27/2011    Past Medical History:  Diagnosis Date  . Arthritis   . Breast cancer ([place])    lumpectomy and radiation  . CKD (chronic kidney disease), stage III   . Colon cancer ([place])    resection  . GERD (gastroesophageal reflux disease)   . Glaucoma   . Hyperlipidemia   . Hypertension   . Meniere disease   . Osteoporosis   . Overactive thyroid gland    treated with liquid iodine  . Vertigo    doing PT     Past Surgical History:  Procedure Laterality Date  . APPENDECTOMY    . BREAST SURGERY     lumpectomy-left  . ORIF HUMERUS FRACTURE Right 11/26/2014   Procedure: OPEN REDUCTION INTERNAL FIXATION (ORIF) HUMERAL SHAFT FRACTURE;  Surgeon: [place], MD;  Location: [facility];  Service: Orthopedics;  Laterality: Right;  . SMALL INTESTINE SURGERY     resection  . SPINE SURGERY     x2  . TONSILLECTOMY AND ADENOIDECTOMY  before age 58  . TOTAL HIP ARTHROPLASTY Bilateral    bilateral one 20 years ago, other 8-10 years ago    MEDS:  Reviewed in EPIC and UTD  ALLERGIES: Iodine  Family History  Problem Relation Age of Onset  . Stroke Mother   . Colon cancer Father     dx in 27s  . Lung cancer Sister 40  . BRCA 1/2 Daughter  BRCA2 positive  . BRCA 1/2 Daughter     BRCA2 positive  . Breast cancer Sister 27    bilateral breast cancer, 2nd dx at 79  . Colon cancer Sister 105  . Breast cancer Sister 8    bilateral breast cancer; 2nd dx age 82  . Leukemia Other     dx in his 7s, youngest sister's son  . Hypertension Other     materal side  . Breast cancer Paternal Aunt     <50  . Thyroid disease Daughter   . Heart disease Neg Hx   . Hyperlipidemia Neg Hx     SH:  Widowed, non smoker  Review of Systems  Respiratory: Positive for cough. Negative for hemoptysis, sputum production, shortness of breath and wheezing.   Gastrointestinal: Negative for blood in stool.  All other systems reviewed and are  negative.   PHYSICAL EXAMINATION:    BP 138/80 (BP Location: Right Arm, Patient Position: Sitting, Cuff Size: Normal)   Pulse 80   Ht _0  (1.575 m)   Wt 125 lb (56.7 kg)   BMI 22.86 kg/m     General appearance: alert, cooperative and appears stated age Neck: no adenopathy, supple, symmetrical, trachea midline and thyroid normal to inspection and palpation CV:  Regular rate and rhythm Lungs:  clear to auscultation, no wheezes, rales or rhonchi, symmetric air entry Breasts: normal appearance of right breast wtihout masses or tenderness, no LAD.  left breast with firm, irrgular thickness at 1 o'clock and more medical  Pelvic: External genitalia:  no lesions              Urethra:  normal appearing urethra with no masses, tenderness or lesions              Bartholins and Skenes: normal                 Vagina: normal appearing vagina with normal color and discharge, no lesions              Anus:  Significant external, non thrombosed hemorrhoids  Chaperone was present for exam.  Assessment: Chronic cough  Hemorrhoids Breast firmness/irregularity  Plan: Diagnostic MMG will be ordered Anusol HC to be used every 6 hours prn.  Offered suppository.  Pt declines this. Pt will follow up with Dr. Melvyn Novas regarding cough.   ~25 minutes spent with patient >50% of time was in face to face discussion with patient and her daughter.  Obtaining history is difficult due to interruptions of patient or her daughter by the other individual.

## 2016-01-20 NOTE — Progress Notes (Signed)
Patient is scheduled for Bilateral Breast Diagnostic Mammogram and L Breast Ultrasound at [facility] on 01/27/16 at 1500 . Patient agreeable to time/date/location.

## 2016-01-22 ENCOUNTER — Encounter: Payer: Self-pay | Admitting: Obstetrics & Gynecology

## 2016-01-22 ENCOUNTER — Ambulatory Visit (INDEPENDENT_AMBULATORY_CARE_PROVIDER_SITE_OTHER): Payer: Medicare Other | Admitting: Adult Health

## 2016-01-22 ENCOUNTER — Ambulatory Visit (INDEPENDENT_AMBULATORY_CARE_PROVIDER_SITE_OTHER)
Admission: RE | Admit: 2016-01-22 | Discharge: 2016-01-22 | Disposition: A | Discharge status: Home / Self Care | Payer: Medicare Other | Source: Ambulatory Visit | Attending: Adult Health | Admitting: Adult Health

## 2016-01-22 ENCOUNTER — Other Ambulatory Visit (INDEPENDENT_AMBULATORY_CARE_PROVIDER_SITE_OTHER): Payer: Medicare Other

## 2016-01-22 ENCOUNTER — Encounter: Payer: Self-pay | Admitting: Adult Health

## 2016-01-22 VITALS — BP 130/68 | HR 80 | Temp 98.3°F | Ht 62.0 in | Wt 125.0 lb

## 2016-01-22 DIAGNOSIS — J302 Other seasonal allergic rhinitis: Secondary | ICD-10-CM

## 2016-01-22 DIAGNOSIS — R058 Other specified cough: Secondary | ICD-10-CM

## 2016-01-22 DIAGNOSIS — R0600 Dyspnea, unspecified: Secondary | ICD-10-CM

## 2016-01-22 DIAGNOSIS — R05 Cough: Secondary | ICD-10-CM | POA: Diagnosis not present

## 2016-01-22 LAB — CBC WITH DIFFERENTIAL/PLATELET
BASOS ABS: 0 10*3/uL (ref 0.0–0.1)
Basophils Relative: 0.5 % (ref 0.0–3.0)
EOS ABS: 0.6 10*3/uL (ref 0.0–0.7)
Eosinophils Relative: 7.4 % — ABNORMAL HIGH (ref 0.0–5.0)
HCT: 33.8 % — ABNORMAL LOW (ref 36.0–46.0)
Hemoglobin: 11.6 g/dL — ABNORMAL LOW (ref 12.0–15.0)
LYMPHS ABS: 1.2 10*3/uL (ref 0.7–4.0)
Lymphocytes Relative: 14.9 % (ref 12.0–46.0)
MCHC: 34.3 g/dL (ref 30.0–36.0)
MCV: 95 fl (ref 78.0–100.0)
MONO ABS: 0.7 10*3/uL (ref 0.1–1.0)
Monocytes Relative: 8.1 % (ref 3.0–12.0)
NEUTROS PCT: 69.1 % (ref 43.0–77.0)
Neutro Abs: 5.6 10*3/uL (ref 1.4–7.7)
PLATELETS: 346 10*3/uL (ref 150.0–400.0)
RBC: 3.56 Mil/uL — AB (ref 3.87–5.11)
RDW: 12.9 % (ref 11.5–15.5)
WBC: 8.1 10*3/uL (ref 4.0–10.5)

## 2016-01-22 LAB — NITRIC OXIDE: NITRIC OXIDE: 37

## 2016-01-22 MED ORDER — AEROCHAMBER MV MISC: 0 refills | Status: DC

## 2016-01-22 NOTE — Patient Instructions (Signed)
Begin Zyrtec 10mg  daily 1/2 At bedtime  .  Begin Robitussin DM 1 tsp every 4-6hr As needed  Cough .  Use sips of water to avoid throat clearing or cough.  NO MINT products.  Begin QVAR with spacer 2 puffs Twice daily  , rinse well after use.  Continue on Protonix 40mg  daily before meal .  Continue on Pepcid 20mg  At bedtime  .  Labs and chest xray today.  Follow up Dr. Melvyn Novas in 6-8 weeks and As needed   Please contact office for sooner follow up if symptoms do not improve or worsen or seek emergency care

## 2016-01-22 NOTE — Progress Notes (Signed)
Subjective:    Patient ID: Stephanie Martinez, female    DOB: [birth year],    MRN: [id number]    Brief patient profile:  32 yowf quit smoking x 1980 s resp complaints until then cough x 07/2014 refractory to rx for copd/ab so referred to pulmonary clinic 05/23/2015 by Dr Alona Bene    History of Present Illness  05/23/2015 1st [facility] office visit/ [facility]   Chief Complaint  Patient presents with  . PULMONARY CONSULT    Referred by Dr. Kennith Center. Pt c/o dry cough but feels like there is mucus in her chest and some hoarseness. Pt denies wheeze/SOB/CP/tightness. Pt has sampled several inhalers and none seem to have really helped much. Pt's daughter reports that she breathes very shallowly when sleeping.   indolent onset x 07/2014 persistent daily cough tends to be after supper > wolicki eval neg ? Takes omeprazole at hs though not on her med list. No better with Breo/ mostly dry assoc with hoarseness and sense of pnds/ not present generally at hs or disturbing sleep.  rec Prednisone 10 mg take  4 each am x 2 days,   2 each am x 2 days,  1 each am x 2 days and stop  Take delsym two tsp every 12 hours and supplement if needed with  Tylenol #3  every 4 hours  GERD diet   Pantoprazole (protonix) 40 mg   Take  30-60 min before first meal of the day and Pepcid (famotidine)  20 mg one after supper  until return to office - this is the best way to tell whether stomach acid is contributing to your problem.   Please schedule a follow up office visit in 2 weeks, sooner if needed - bring all active meds with you   06/13/2015  f/u ov/[facility]ert re: cough since 07/2014  resolved - no meds in hand  Chief Complaint  Patient presents with  . Follow-up    Pt states that she improved greatly after last OV and cough stopped, however 2 days ago she has developed a slight dry cough. Pt denies wheeze/SOB/CP/tightness.    never actually used the Tylenol 3but within 48 hours of starting the prednisone and taking the  reflux medicine the cough has resolved. Does have some sense of cough assoc with meals     >>pred taper , delsym   01/22/2016 Acute OV  : Chronic Cough (since 07/2014)  Pt presents for an acute office visit . She is accompanied by family member.  Complains of flare of cough for last few months. She complains of dry cough . Cough is wearing her out. Cough so hard she gets nauseas. .  Denies any chest congestion/tightness, sinus pressure/drainage, fever, nausea or vomiting.   Has tried delsym with no relief.  Previously treated with BREO with no benefit. Says 1 inhaler in past helped some .  Says on ocaasion she will get better but not totally gone and then coughs some back and gets progressively worse.  Placed on PPI and Pecid . No overt reflux.  Spirometry today is normal . FENO is 37 today .    Former smoker , heavy smoker , quit in her 27s.  Have 3 cats. No birds or chickens.  No extensive travel.  No recent abx..  No chronic UTI .      Current Medications, Allergies, Complete Past Medical History, Past Surgical History, Family History, and Social History were reviewed in [employer] record.  ROS  The following are not active complaints unless bolded sore throat,  dental problems, itching, sneezing,  nasal congestion or excess/ purulent secretions, ear ache,   fever, chills, sweats, unintended wt loss, classically pleuritic or exertional cp, hemoptysis,  orthopnea pnd or leg swelling, presyncope, palpitations, abdominal pain, anorexia, nausea, vomiting, diarrhea  or change in bowel or bladder habits, change in stools or urine, dysuria,hematuria,  rash, arthralgias, visual complaints, headache, numbness, weakness or ataxia or or coordination,  change in mood/affect or memory.                   Objective:   Physical Exam  Frail elderly wf walker  Vitals:   01/22/16 1531  BP: 130/68  Pulse: 80  Temp: 98.3 F (36.8 C)  TempSrc: Oral  SpO2: 97%  Weight:  125 lb (56.7 kg)  Height: 5\' 2"  (1.575 m)     Vital signs reviewed   HEENT: nl dentition, turbinates, and oropharynx. Nl external ear canals without cough reflex   NECK :  without JVD/Nodes/TM/ nl carotid upstrokes bilaterally   LUNGS: no acc muscle use,  slt barrel contour chest which is clear to A and P c distant bs  bilaterally without cough on insp or exp maneuvers   CV:  RRR  no s3 or murmur or increase in P2, no edema   ABD:  soft and nontender with end insp hoover's sign  in the supine position. No bruits or organomegaly, bowel sounds nl  MS:  Nl gait/ ext warm without deformities, calf tenderness, cyanosis or clubbing No obvious joint restrictions   SKIN: warm and dry without lesions    NEURO:  alert, approp, nl sensorium with  no motor deficits but  gen weakness       CXR:  11/25/14  Stable exam. Cardiomegaly with chronic changes   Julies Carmickle NP-C  [facility]  01/22/16

## 2016-01-23 LAB — RESPIRATORY ALLERGY PROFILE REGION II ~~LOC~~
Allergen, A. alternata, m6: <0.1 kU/L
Allergen, D pternoyssinus,d7: <0.1 kU/L
Allergen, Oak,t7: <0.1 kU/L
Aspergillus fumigatus, m3: <0.1 kU/L
Cat Dander: <0.1 kU/L
D. farinae: <0.1 kU/L
IGE (IMMUNOGLOBULIN E), SERUM: 3 kU/L (ref <115)
Johnson Grass: <0.1 kU/L
Pecan/Hickory Tree IgE: <0.1 kU/L
Rough Pigweed  IgE: <0.1 kU/L
Timothy Grass: <0.1 kU/L

## 2016-01-23 NOTE — Assessment & Plan Note (Signed)
Control for triggers  

## 2016-01-23 NOTE — Assessment & Plan Note (Addendum)
Chronic cough vs cough variant asthma  Check cxr and lab  Add spacer with MDI   Plan  Begin Zyrtec 10mg  daily 1/2 At bedtime  .  Begin Robitussin DM 1 tsp every 4-6hr As needed  Cough .  Use sips of water to avoid throat clearing or cough.  NO MINT products.  Begin QVAR with spacer 2 puffs Twice daily  , rinse well after use.  Continue on Protonix 40mg  daily before meal .  Continue on Pepcid 20mg  At bedtime  .  Labs and chest xray today.  Follow up Dr. Melvyn Novas in 6-8 weeks and As needed   Please contact office for sooner follow up if symptoms do not improve or worsen or seek emergency care

## 2016-01-25 NOTE — Progress Notes (Signed)
Chart and office note reviewed in detail  > agree with a/p as outlined    

## 2016-01-26 NOTE — Progress Notes (Signed)
Called spoke with patient, advised of lab results / recs as stated by TP.  Pt verbalized her understanding and denied any questions. 

## 2016-01-26 NOTE — Progress Notes (Signed)
Called spoke with patient's daughter Izora Gala and advised of cxr results / recs as stated by TP.  Izora Gala voiced her understanding and denied any questions/concerns.

## 2016-01-27 ENCOUNTER — Ambulatory Visit
Admission: RE | Admit: 2016-01-27 | Discharge: 2016-01-27 | Disposition: A | Discharge status: Home / Self Care | Payer: Medicare Other | Source: Ambulatory Visit | Attending: Obstetrics & Gynecology | Admitting: Obstetrics & Gynecology

## 2016-01-27 ENCOUNTER — Other Ambulatory Visit: Payer: Self-pay | Admitting: Obstetrics & Gynecology

## 2016-01-27 DIAGNOSIS — N6321 Unspecified lump in the left breast, upper outer quadrant: Secondary | ICD-10-CM

## 2016-01-27 DIAGNOSIS — N63 Unspecified lump in unspecified breast: Secondary | ICD-10-CM

## 2016-01-27 DIAGNOSIS — N6310 Unspecified lump in the right breast, unspecified quadrant: Secondary | ICD-10-CM

## 2016-02-02 ENCOUNTER — Other Ambulatory Visit: Payer: Self-pay | Admitting: Obstetrics & Gynecology

## 2016-02-02 DIAGNOSIS — N63 Unspecified lump in unspecified breast: Secondary | ICD-10-CM

## 2016-02-05 ENCOUNTER — Ambulatory Visit
Admission: RE | Admit: 2016-02-05 | Discharge: 2016-02-05 | Disposition: A | Discharge status: Home / Self Care | Payer: Medicare Other | Source: Ambulatory Visit | Attending: Obstetrics & Gynecology | Admitting: Obstetrics & Gynecology

## 2016-02-05 DIAGNOSIS — N63 Unspecified lump in unspecified breast: Secondary | ICD-10-CM

## 2016-02-24 ENCOUNTER — Other Ambulatory Visit: Payer: Self-pay | Admitting: General Surgery

## 2016-03-04 ENCOUNTER — Other Ambulatory Visit (INDEPENDENT_AMBULATORY_CARE_PROVIDER_SITE_OTHER): Payer: Medicare Other

## 2016-03-04 ENCOUNTER — Ambulatory Visit (INDEPENDENT_AMBULATORY_CARE_PROVIDER_SITE_OTHER): Payer: Medicare Other | Admitting: Nurse Practitioner

## 2016-03-04 ENCOUNTER — Encounter: Payer: Self-pay | Admitting: Nurse Practitioner

## 2016-03-04 VITALS — BP 140/78 | HR 88 | Temp 97.7°F | Ht 62.0 in | Wt 129.1 lb

## 2016-03-04 DIAGNOSIS — R600 Localized edema: Secondary | ICD-10-CM

## 2016-03-04 DIAGNOSIS — Z23 Encounter for immunization: Secondary | ICD-10-CM

## 2016-03-04 LAB — BASIC METABOLIC PANEL
BUN: 23 mg/dL (ref 6–23)
CO2: 27 mEq/L (ref 19–32)
CREATININE: 1.41 mg/dL — AB (ref 0.40–1.20)
Calcium: 9.2 mg/dL (ref 8.4–10.5)
Chloride: 102 mEq/L (ref 96–112)
GFR: 37.05 mL/min — AB (ref >60.00)
Glucose, Bld: 105 mg/dL — ABNORMAL HIGH (ref 70–99)
Potassium: 4 mEq/L (ref 3.5–5.1)
Sodium: 136 mEq/L (ref 135–145)

## 2016-03-04 MED ORDER — FUROSEMIDE 20 MG PO TABS: 10.0000 mg | ORAL_TABLET | Freq: Every day | ORAL | 0 refills | Status: DC

## 2016-03-04 NOTE — Patient Instructions (Addendum)
Edema may be due to venous insufficiency vs side effect of amlodipine vs DVT. No diuretic prescribed at this time due to history of chronic kidney disease stage III. Will review lab results before considering diuretic use. Take amlodipine at night Wear compression stocking during day and off at night. Go to lab for blood draw. You will be contacted for the venous Doppler appointment tomorrow.

## 2016-03-04 NOTE — Progress Notes (Signed)
Pre visit review using our clinic review tool, if applicable. No additional management support is needed unless otherwise documented below in the visit note. 

## 2016-03-04 NOTE — Progress Notes (Signed)
abnormal, Inform patient that BMP indicates persistent kidney disease at stage III. We'll send prescription for furosemide which is a loop diuretic for 3 days. Return to office for venous Doppler when scheduled.

## 2016-03-04 NOTE — Progress Notes (Signed)
Subjective:  Patient ID: Stephanie Martinez, female    DOB: 1922/02/24  Age: 82 y.o. MRN: [id number]  CC: Leg Swelling (left foot and ankle swollen FOR 2 WEEKS )   HPI Stephanie Martinez presents with bilateral ankle edema(left greater than right), onset 2 weeks ago, edema in left lower extremity is associated with pain. Mild improvement of swelling with elevation overnight. Left leg feels tight and heavy. Denies any recent immobilization, no hospitalization, no injury, no chest pain or shortness of breath, no palpitations, no PND. No abdominal fullness or nausea. No numbness or tingling. No history of DVT, has history of venous insufficiency with venous ligation.  Outpatient Medications Prior to Visit  Medication Sig Dispense Refill  . acetaminophen (TYLENOL) 500 MG tablet Take 500 mg by mouth every 6 (six) hours as needed.    Marland Kitchen amLODipine (NORVASC) 10 MG tablet Take 1 tablet (10 mg total) by mouth daily. 30 tablet 3  . aspirin 81 MG tablet Take 81 mg by mouth daily.    . calcium-vitamin D (OSCAL WITH D) 500-200 MG-UNIT tablet Take 1 tablet by mouth daily.    . carvedilol (COREG) 12.5 MG tablet Take 1 tablet (12.5 mg total) by mouth 2 (two) times daily. 60 tablet 3  . diphenhydramine-acetaminophen (TYLENOL PM) 25-500 MG TABS tablet Take 1 tablet by mouth at bedtime as needed.    . famotidine (PEPCID) 20 MG tablet TAKE 1 TABLET BY MOUTH EVERY DAY AFTER SUPPER 30 tablet 0  . GLUCOSAMINE HCL PO Take 1,000 mg by mouth daily.    . hydrocortisone (ANUSOL-HC) 2.5 % rectal cream Place 1 application rectally 3 (three) times daily. 30 g 1  . latanoprost (XALATAN) 0.005 % ophthalmic solution Place 1 drop into both eyes at bedtime.     . LUTEIN PO Take 25 mg by mouth daily.    . pantoprazole (PROTONIX) 40 MG tablet TAKE 1 TABLET BY MOUTH EVERY DAY. TAKE 30-60 MINUTES BEFORE FIRST MEAL OF THE DAY 30 tablet 5  . Spacer/Aero-Holding Chambers (AEROCHAMBER MV) inhaler Use as instructed 1 each 0  . vitamin C  (ASCORBIC ACID) 500 MG tablet Take 500 mg by mouth daily.    . Vitamin D, Ergocalciferol, (DRISDOL) 50000 units CAPS capsule TAKE ONE CAPSULE BY MOUTH EVERY WEEK 12 capsule 0   No facility-administered medications prior to visit.     ROS See HPI  Objective:  BP 140/78 (BP Location: Left Arm, Patient Position: Sitting, Cuff Size: Normal)   Pulse 88   Temp 97.7 F (36.5 C)   Ht _0  (1.575 m)   Wt 129 lb 1.9 oz (58.6 kg)   SpO2 96%   BMI 23.62 kg/m   BP Readings from Last 3 Encounters:  03/04/16 140/78  01/22/16 130/68  01/20/16 138/80    Wt Readings from Last 3 Encounters:  03/04/16 129 lb 1.9 oz (58.6 kg)  01/22/16 125 lb (56.7 kg)  01/20/16 125 lb (56.7 kg)    Physical Exam  Constitutional: Stephanie Martinez is oriented to person, place, and time. No distress.  Cardiovascular: Normal rate.   Pulmonary/Chest: Effort normal and breath sounds normal.  Musculoskeletal: Stephanie Martinez exhibits edema and tenderness.  Left lower extremity edema(pitting, 2+, significantly greater compared to right leg, from knee to toes) Mild pitting edema and right ankle only. No signs of cellulitis at this time.  Neurological: Stephanie Martinez is alert and oriented to person, place, and time.  Skin: Skin is warm and dry. No erythema.  Vitals reviewed.  Lab Results  Component Value Date   WBC 8.1 01/22/2016   HGB 11.6 (L) 01/22/2016   HCT 33.8 (L) 01/22/2016   PLT 346.0 01/22/2016   GLUCOSE 105 (H) 03/04/2016   CHOL 155 11/26/2014   TRIG 102 11/26/2014   HDL 33 (L) 11/26/2014   LDLDIRECT 124.0 11/14/2014   LDLCALC 102 (H) 11/26/2014   ALT 15 11/26/2014   AST 19 11/26/2014   NA 136 03/04/2016   K 4.0 03/04/2016   CL 102 03/04/2016   CREATININE 1.41 (H) 03/04/2016   BUN 23 03/04/2016   CO2 27 03/04/2016   TSH 2.05 11/14/2014   INR 1.11 11/25/2014   HGBA1C 5.7 09/18/2012    Mm Digital Diagnostic Unilat L  Result Date: 02/05/2016 CLINICAL DATA:  Post ultrasound-guided core needle biopsy of left breast 3  o'clock mass. EXAM: DIAGNOSTIC LEFT MAMMOGRAM POST ULTRASOUND BIOPSY COMPARISON:  Previous exam(s). FINDINGS: Mammographic images were obtained following ultrasound guided biopsy of left breast 3 o'clock mass. Two-view mammography demonstrates presence of coil shaped marker within the left 3 o'clock breast, in satisfactory radiographic position. Expected post biopsy changes are seen. IMPRESSION: Successful placement of coil shaped marker within the left breast 3 o'clock, post ultrasound-guided core needle biopsy. 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 Final Assessment: Post Procedure Mammograms for Marker Placement Electronically Signed   By: Ted Mcalpine M.D.   On: 02/05/2016 14:40   [place] Lt Breast Bx W Loc Dev 1st Lesion Img Bx Spec US Guide  Addendum Date: 02/06/2016   ADDENDUM REPORT: 02/06/2016 14:18 ADDENDUM: Pathology revealed DENSE STROMAL FIBROSIS of the Left breast at the 3:00 o'clock location. This was found to be concordant by Dr. Ted Mcalpine. Pathology results were discussed with the patient's daughter, Zona Pedro, by telephone. Stephanie Martinez reported her mother did well after the biopsy with tenderness at the site. Post biopsy instructions and care were reviewed and questions were answered. The patient's daughter was encouraged to call [facility] for any additional concerns. Surgical consultation has been arranged with Dr. 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 Pathology results reported by Rene Kocher, RN on 02/06/2016. Electronically Signed   By: Ted Mcalpine M.D.    On: 02/06/2016 14:18   Result Date: 02/06/2016 CLINICAL DATA:  Left breast 3 o'clock mass. EXAM: ULTRASOUND GUIDED LEFT BREAST CORE NEEDLE BIOPSY COMPARISON:  Previous exam(s). FINDINGS: I met with the patient and we discussed the procedure of ultrasound-guided biopsy, including benefits and alternatives. We discussed the high likelihood of a successful procedure. We discussed the risks of the procedure, including infection, bleeding, tissue injury, clip migration, and inadequate sampling. Informed written consent was given. The usual time-out protocol was performed immediately prior to the procedure. Using sterile technique and 1% Lidocaine as local anesthetic, under direct ultrasound visualization, a 14 gauge spring-loaded device was used to perform biopsy of left breast 3 o'clock mass using a lateral approach. At the conclusion of the procedure a coil shaped tissue marker clip was deployed into the biopsy cavity. Follow up 2 view mammogram was performed and dictated separately. IMPRESSION: Ultrasound guided biopsy of left breast 3 o'clock mass. No apparent  complications. 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 Electronically Signed: By: Fidela Salisbury M.D. On: 02/05/2016 14:40    Assessment & Plan:   Kenzli was seen today for leg swelling.  Diagnoses and all orders for this visit:  Lower leg edema -     Basic metabolic panel; Future -     VAS [place] LOWER EXTREMITY VENOUS (DVT); Future -     furosemide (LASIX) 20 MG tablet; Take 0.5 tablets (10 mg total) by mouth daily.  Need for prophylactic vaccination and inoculation against influenza -     Flu vaccine HIGH DOSE PF   I am having Stephanie Martinez start on furosemide. I  am also having her maintain her latanoprost, LUTEIN PO, aspirin, GLUCOSAMINE HCL PO, vitamin C, diphenhydramine-acetaminophen, pantoprazole, calcium-vitamin D, acetaminophen, carvedilol, amLODipine, Vitamin D (Ergocalciferol), famotidine, hydrocortisone, and AEROCHAMBER MV.  Meds ordered this encounter  Medications  . furosemide (LASIX) 20 MG tablet    Sig: Take 0.5 tablets (10 mg total) by mouth daily.    Dispense:  3 tablet    Refill:  0    Order Specific Question:   Supervising Provider    Answer:   Cassandria Anger [[id number]]    Follow-up: Return if symptoms worsen or fail to improve.  Wilfred Lacy, NP

## 2016-03-09 ENCOUNTER — Ambulatory Visit: Payer: Medicare Other | Admitting: Internal Medicine

## 2016-03-10 ENCOUNTER — Telehealth: Payer: Self-pay

## 2016-03-10 ENCOUNTER — Telehealth: Payer: Self-pay | Admitting: Emergency Medicine

## 2016-03-10 NOTE — Telephone Encounter (Signed)
Routing to [place], please advise, thanks 

## 2016-03-10 NOTE — Telephone Encounter (Signed)
Left voicemail on patient's daugher's voicemail (nancy Sick)--reminding of appt tomorrow 9/21 at 3:30 for ct to check for possible dvt----no need for any additional lasix

## 2016-03-10 NOTE — Telephone Encounter (Signed)
I called patients daughter and she states she is at work and wouldn't have time to get off work, get to her moms and get her ready today. Do you recommend anything else?

## 2016-03-10 NOTE — Telephone Encounter (Signed)
I have left voice mail on daughter, nancy's cell phone reminding of ct appt tomorrow 9/21 at 3:30---per [place], no need to take anymore lasix---but need to keep appt tomorrow to check for possible dvt

## 2016-03-10 NOTE — Telephone Encounter (Signed)
Will it be possible to reschedule her venous doppler for today?

## 2016-03-10 NOTE — Telephone Encounter (Signed)
Pts daughter called. She stated her moms legs are still swollen and her doppler appt isnt until tomorrow. She wants to know if she needs another prescription for Lasik. Pt isnt using the restroom anymore than before so shes not sure how well its working. Please advise thanks.

## 2016-03-10 NOTE — Telephone Encounter (Signed)
Since swelling did not improve with use of lasix. I do not recommend additional lasix at this time till we get doppler results. Maintain appt for venous doppler tomorrow. Thank you

## 2016-03-11 ENCOUNTER — Ambulatory Visit (HOSPITAL_COMMUNITY)
Admission: RE | Admit: 2016-03-11 | Discharge: 2016-03-11 | Disposition: A | Discharge status: Home / Self Care | Payer: Medicare Other | Source: Ambulatory Visit | Attending: Nurse Practitioner | Admitting: Nurse Practitioner

## 2016-03-11 DIAGNOSIS — I10 Essential (primary) hypertension: Secondary | ICD-10-CM | POA: Insufficient documentation

## 2016-03-11 DIAGNOSIS — E785 Hyperlipidemia, unspecified: Secondary | ICD-10-CM | POA: Diagnosis not present

## 2016-03-11 DIAGNOSIS — Z87891 Personal history of nicotine dependence: Secondary | ICD-10-CM | POA: Insufficient documentation

## 2016-03-11 DIAGNOSIS — J449 Chronic obstructive pulmonary disease, unspecified: Secondary | ICD-10-CM | POA: Diagnosis not present

## 2016-03-11 DIAGNOSIS — R600 Localized edema: Secondary | ICD-10-CM | POA: Insufficient documentation

## 2016-03-12 ENCOUNTER — Telehealth: Payer: Self-pay | Admitting: Internal Medicine

## 2016-03-12 ENCOUNTER — Other Ambulatory Visit: Payer: Self-pay | Admitting: Nurse Practitioner

## 2016-03-12 ENCOUNTER — Other Ambulatory Visit: Payer: Self-pay | Admitting: Internal Medicine

## 2016-03-12 DIAGNOSIS — R899 Unspecified abnormal finding in specimens from other organs, systems and tissues: Secondary | ICD-10-CM

## 2016-03-12 DIAGNOSIS — R600 Localized edema: Secondary | ICD-10-CM

## 2016-03-12 MED ORDER — FUROSEMIDE 20 MG PO TABS: 20.0000 mg | ORAL_TABLET | Freq: Every day | ORAL | 3 refills | Status: DC

## 2016-03-12 NOTE — Telephone Encounter (Signed)
Daughter called in and has several questions about pt and meds.  Can you call her when you get a chance

## 2016-03-13 NOTE — Telephone Encounter (Signed)
Spoke to [place] (pt dtr). Informed of dc lasix after the initial increase and to prop feet up as much as possible.  She wanted to know the type of compression stocking (how much pressure) she should get.  She is bringing pt back in on the 27th for repeat BMET and will schedule follow up for the next day or two.

## 2016-03-17 ENCOUNTER — Other Ambulatory Visit (INDEPENDENT_AMBULATORY_CARE_PROVIDER_SITE_OTHER): Payer: Medicare Other

## 2016-03-17 DIAGNOSIS — R899 Unspecified abnormal finding in specimens from other organs, systems and tissues: Secondary | ICD-10-CM

## 2016-03-17 LAB — BASIC METABOLIC PANEL
BUN: 23 mg/dL (ref 6–23)
CO2: 31 meq/L (ref 19–32)
Calcium: 9.4 mg/dL (ref 8.4–10.5)
Chloride: 101 mEq/L (ref 96–112)
Creatinine, Ser: 1.53 mg/dL — ABNORMAL HIGH (ref 0.40–1.20)
GFR: 33.72 mL/min — AB (ref >60.00)
GLUCOSE: 98 mg/dL (ref 70–99)
POTASSIUM: 4.1 meq/L (ref 3.5–5.1)
SODIUM: 140 meq/L (ref 135–145)

## 2016-03-22 ENCOUNTER — Encounter: Payer: Self-pay | Admitting: Internal Medicine

## 2016-03-22 ENCOUNTER — Ambulatory Visit (INDEPENDENT_AMBULATORY_CARE_PROVIDER_SITE_OTHER): Payer: Medicare Other | Admitting: Internal Medicine

## 2016-03-22 VITALS — BP 126/84 | HR 77 | Ht 62.0 in

## 2016-03-22 DIAGNOSIS — R05 Cough: Secondary | ICD-10-CM | POA: Diagnosis not present

## 2016-03-22 DIAGNOSIS — R058 Other specified cough: Secondary | ICD-10-CM

## 2016-03-22 NOTE — Patient Instructions (Addendum)
Leave off the inhaler for now   Continue Pantoprazole (protonix) 40 mg   Take  30-60 min before first meal of the day and Pepcid (famotidine)  20 mg one @  bedtime until return to office - this is the best way to tell whether stomach acid is contributing to your problem.    For cough > delsym 2tsp every 12 hours as needed  For drainage / throat tickle try take CHLORPHENIRAMINE  4 mg - take one every 4 hours as needed - available over the counter- may cause drowsiness so start with just a bedtime dose or two and see how you tolerate it before trying in daytime    Please see patient coordinator before you leave today  to schedule sinus CT and Dg Esophagram  At [facility]    If you are satisfied with your treatment plan,  let your doctor know and he/she can either refill your medications or you can return here when your prescription runs out.     If in any way you are not 100% satisfied,  please tell us.  If 100% better, tell your friends!  Pulmonary follow up is as needed

## 2016-03-22 NOTE — Assessment & Plan Note (Addendum)
Allergy profile 01/22/2016 >  Eos 0.6/  IgE 3 neg RAST  - sinus CT 03/22/2016 >>>  - Dg Es 03/22/2016 >>>    I had an extended discussion with the patient daughter reviewing all relevant studies completed to date and  lasting 15 to 20 minutes of a 25 minute visit on the following ongoing concerns:   The standardized cough guidelines published in Chest by Lissa Morales in 2006 are still the best available and consist of a multiple step process (up to 12!) , not a single office visit,  and are intended  to address this problem logically,  with an alogrithm dependent on response to empiric treatment at  each progressive step  to determine a specific diagnosis with  minimal addtional testing needed. Therefore if adherence is an issue or can't be accurately verified,  it's very unlikely the standard evaluation and treatment will be successful here.    Furthermore, response to therapy (other than acute cough suppression, which should only be used short term with avoidance of narcotic containing cough syrups if possible), can be a gradual process for which the patient is not likely to  perceive immediate benefit.  Unlike going to an eye doctor where the best perscription is almost always the first one and is immediately effective, this is almost never the case in the management of chronic cough syndromes. Therefore the patient needs to commit up front to consistently adhere to recommendations  for up to 6 weeks of therapy directed at the likely underlying problem(s) before the response can be reasonably evaluated.    Next step in the process is to check DgEs and sinus CT and try back off qvar since she's not convinced it helped and very little to suggest this is really asthma (esp given lack of noct flares) and try 1st gen h1 to see if helps the component that appears to be related to pnds   Each maintenance medication was reviewed in detail including most importantly the difference between maintenance and as  needed and under what circumstances the prns are to be used.  Please see instructions for details which were reviewed in writing and the patient given a copy.

## 2016-03-22 NOTE — Progress Notes (Signed)
Subjective:    Patient ID: Stephanie Martinez, female    DOB: [birth year],    MRN: [id number]    Brief patient profile:  89 yowf quit smoking x 1980 s resp complaints until then cough x 07/2014 refractory to rx for copd/ab so referred to pulmonary clinic 05/23/2015 by Dr Alona Bene    History of Present Illness  05/23/2015 1st [facility] office visit/ Stephanie Martinez   Chief Complaint  Patient presents with  . PULMONARY CONSULT    Referred by Dr. Kennith Center. Pt c/o dry cough but feels like there is mucus in her chest and some hoarseness. Pt denies wheeze/SOB/CP/tightness. Pt has sampled several inhalers and none seem to have really helped much. Pt's daughter reports that she breathes very shallowly when sleeping.   indolent onset x 07/2014 persistent daily cough tends to be after supper > wolicki eval neg ? Takes omeprazole at hs though not on her med list. No better with Breo/ mostly dry assoc with hoarseness and sense of pnds/ not present generally at hs or disturbing sleep.  rec Prednisone 10 mg take  4 each am x 2 days,   2 each am x 2 days,  1 each am x 2 days and stop  Take delsym two tsp every 12 hours and supplement if needed with  Tylenol #3  every 4 hours  GERD diet   Pantoprazole (protonix) 40 mg   Take  30-60 min before first meal of the day and Pepcid (famotidine)  20 mg one after supper  until return to office - this is the best way to tell whether stomach acid is contributing to your problem.   Please schedule a follow up office visit in 2 weeks, sooner if needed - bring all active meds with you   06/13/2015  f/u ov/Stephanie Martinez re: cough since 07/2014  resolved - no meds in hand  Chief Complaint  Patient presents with  . Follow-up    Pt states that she improved greatly after last OV and cough stopped, however 2 days ago she has developed a slight dry cough. Pt denies wheeze/SOB/CP/tightness.   never actually used the Tylenol 3 but within 48 hours of starting the prednisone and taking the  reflux medicine the cough has resolved. Does have some sense of cough assoc with meals     rec Take delsym two tsp every 12 hours and supplement if needed with tylenol #3  mg up to 2 every 4 hours to suppress the urge to cough. Swallowing water or using ice chips/non mint and menthol containing candies (such as lifesavers or sugarless jolly ranchers) are also effective.  You should rest your voice and avoid activities that you know make you cough. Once you have eliminated the cough for 3 straight days try reducing the tylenol #3 first,  then the delsym as tolerated.   If needed >>> Prednisone 10 mg take  4 each am x 2 days,   2 each am x 2 days,  1 each am x 2 days and stop  We may need to consider alternative blood pressure pill on trial basis if this cough persists    01/22/2016 NP Acute OV  : Chronic Cough (since 07/2014)  Pt presents for an acute office visit . She is accompanied by family member.  Complains of flare of cough for last few months. She complains of dry cough . Cough is wearing her out. Cough so hard she gets nausea .  Denies any chest congestion/tightness,  sinus pressure/drainage, fever, nausea or vomiting.   Has tried delsym with no relief.  Previously treated with BREO with no benefit. Says 1 inhaler in past helped some .  Says on ocaasion she will get better but not totally gone and then coughs some back and gets progressively worse.  Placed on PPI and Pecid . No overt reflux.  Spirometry today is normal . FENO is 37 today .  Former smoker , heavy smoker , quit in her 72s.  Have 3 cats. No birds or chickens.  Rec Begin Zyrtec 10mg  daily 1/2 At bedtime  .  Begin Robitussin DM 1 tsp every 4-6hr As needed  Cough .  Use sips of water to avoid throat clearing or cough.  NO MINT products.  Begin QVAR with spacer 2 puffs Twice daily  , rinse well after use.  Continue on Protonix 40mg  daily before meal .  Continue on Pepcid 20mg  At bedtime  .         03/22/2016  f/u ov/Stephanie Martinez  re: chronic dry cough better p rx with qvar but still "living on deslym"   Chief Complaint  Patient presents with  . Follow-up    Pt. c/o some coughing, no sob, Pt. denies any chest pain or chest tightness, Used all of her inhaler  cough p stirring and eating bfast  Not typcial waking up  Assoc with pnds but no excess/ purulent sputum or mucus plugs on tyl pm nightly    Not limited by breathing from desired activities    No obvious day to day or daytime variability or assoc  mucus plugs or hemoptysis or cp or chest tightness, subjective wheeze or overt  hb symptoms. No unusual exp hx or h/o childhood pna/ asthma or knowledge of premature birth.  Sleeping ok without nocturnal  or early am exacerbation  of respiratory  c/o's or need for noct saba. Also denies any obvious fluctuation of symptoms with weather or environmental changes or other aggravating or alleviating factors except as outlined above   Current Medications, Allergies, Complete Past Medical History, Past Surgical History, Family History, and Social History were reviewed in [employer] record.  ROS  The following are not active complaints unless bolded sore throat, dysphagia, dental problems, itching, sneezing,  nasal congestion or excess/ purulent secretions, ear ache,   fever, chills, sweats, unintended wt loss, classically pleuritic or exertional cp,  orthopnea pnd or leg swelling, presyncope, palpitations, abdominal pain, anorexia, nausea, vomiting, diarrhea  or change in bowel or bladder habits, change in stools or urine, dysuria,hematuria,  rash, arthralgias, visual complaints, headache, numbness, weakness or ataxia or problems with walking or coordination,  change in mood/affect or memory.                     Objective:   Physical Exam  Frail elderly hoarse wf nad walks with walker but able to get up on exam table with min assist    Wt Readings from Last 3 Encounters:  03/04/16 129 lb 1.9 oz  (58.6 kg)  01/22/16 125 lb (56.7 kg)  01/20/16 125 lb (56.7 kg)    Vital signs reviewed  - sats 95% RA on arrival    HEENT: nl dentition, turbinates, and oropharynx. Nl external ear canals without cough reflex   NECK :  without JVD/Nodes/TM/ nl carotid upstrokes bilaterally   LUNGS: no acc muscle use,  slt barrel contour chest which is clear to A and P c distant bs  bilaterally without cough on insp or exp maneuvers   CV:  RRR  no s3 or murmur or increase in P2, no edema   ABD:  soft and nontender with end insp hoover's sign  in the supine position. No bruits or organomegaly, bowel sounds nl  MS:  Nl gait/ ext warm without deformities, calf tenderness, cyanosis or clubbing No obvious joint restrictions   SKIN: warm and dry without lesions    NEURO:  alert, approp, nl sensorium with  no motor deficits but  gen weakness       I personally reviewed images and agree with radiology impression as follows:  CXR:   8/3/317 Chronic bronchitic changes without infiltrate.  Aortic atherosclerosis.

## 2016-03-24 NOTE — Telephone Encounter (Signed)
Order removed and encounter closed.

## 2016-03-26 ENCOUNTER — Other Ambulatory Visit: Payer: Self-pay | Admitting: Internal Medicine

## 2016-03-27 ENCOUNTER — Other Ambulatory Visit: Payer: Self-pay | Admitting: Cardiology

## 2016-03-30 ENCOUNTER — Other Ambulatory Visit: Payer: Self-pay | Admitting: Internal Medicine

## 2016-03-31 ENCOUNTER — Ambulatory Visit (HOSPITAL_COMMUNITY)
Admission: RE | Admit: 2016-03-31 | Discharge: 2016-03-31 | Disposition: A | Discharge status: Home / Self Care | Payer: Medicare Other | Source: Ambulatory Visit | Attending: Internal Medicine | Admitting: Internal Medicine

## 2016-03-31 DIAGNOSIS — R05 Cough: Secondary | ICD-10-CM | POA: Diagnosis present

## 2016-03-31 DIAGNOSIS — J342 Deviated nasal septum: Secondary | ICD-10-CM | POA: Diagnosis not present

## 2016-03-31 DIAGNOSIS — R058 Other specified cough: Secondary | ICD-10-CM

## 2016-03-31 NOTE — Progress Notes (Signed)
LMTCB

## 2016-04-01 NOTE — Progress Notes (Signed)
Spoke with pt and notified of results per Dr. Wert. Pt verbalized understanding and denied any questions. 

## 2016-04-01 NOTE — Progress Notes (Signed)
Spoke with Stephanie Martinez and notified of results per Dr. Wert. Stephanie Martinez verbalized understanding and denied any questions. 

## 2016-04-24 ENCOUNTER — Other Ambulatory Visit: Payer: Self-pay | Admitting: Cardiology

## 2016-05-09 ENCOUNTER — Other Ambulatory Visit: Payer: Self-pay | Admitting: Cardiology

## 2016-05-20 ENCOUNTER — Other Ambulatory Visit: Payer: Self-pay | Admitting: Cardiology

## 2016-05-20 NOTE — Telephone Encounter (Signed)
Medication Detail    Disp Refills Start End   carvedilol (COREG) 12.5 MG tablet 180 tablet 3 03/29/2016    Sig: TAKE 1 TABLET BY MOUTH TWICE DAILY   Notes to Pharmacy: **Patient requests 90 days supply**   E-Prescribing Status: Receipt confirmed by pharmacy (03/29/2016 2:03 PM EDT)   Pharmacy   [id number] - [place], [address] AT [address]

## 2016-06-07 ENCOUNTER — Other Ambulatory Visit: Payer: Self-pay | Admitting: Cardiology

## 2016-06-08 NOTE — Telephone Encounter (Signed)
Medication Detail    Disp Refills Start End   carvedilol (COREG) 12.5 MG tablet 180 tablet 3 03/29/2016    Sig: TAKE 1 TABLET BY MOUTH TWICE DAILY   Notes to Pharmacy: **Patient requests 90 days supply**   E-Prescribing Status: Receipt confirmed by pharmacy (03/29/2016 2:03 PM EDT)   Pharmacy   [id number] - [place], [address] AT [address]

## 2016-06-09 ENCOUNTER — Encounter (HOSPITAL_COMMUNITY): Payer: Self-pay | Admitting: Emergency Medicine

## 2016-06-09 ENCOUNTER — Emergency Department (HOSPITAL_COMMUNITY)
Admission: EM | Admit: 2016-06-09 | Discharge: 2016-06-10 | Disposition: A | Discharge status: Home / Self Care | Payer: Medicare Other | Attending: Emergency Medicine | Admitting: Emergency Medicine

## 2016-06-09 ENCOUNTER — Other Ambulatory Visit: Payer: Self-pay | Admitting: Cardiology

## 2016-06-09 DIAGNOSIS — Y999 Unspecified external cause status: Secondary | ICD-10-CM | POA: Insufficient documentation

## 2016-06-09 DIAGNOSIS — Y939 Activity, unspecified: Secondary | ICD-10-CM | POA: Diagnosis not present

## 2016-06-09 DIAGNOSIS — I129 Hypertensive chronic kidney disease with stage 1 through stage 4 chronic kidney disease, or unspecified chronic kidney disease: Secondary | ICD-10-CM | POA: Insufficient documentation

## 2016-06-09 DIAGNOSIS — Y929 Unspecified place or not applicable: Secondary | ICD-10-CM | POA: Insufficient documentation

## 2016-06-09 DIAGNOSIS — J449 Chronic obstructive pulmonary disease, unspecified: Secondary | ICD-10-CM | POA: Diagnosis not present

## 2016-06-09 DIAGNOSIS — S81811A Laceration without foreign body, right lower leg, initial encounter: Secondary | ICD-10-CM | POA: Insufficient documentation

## 2016-06-09 DIAGNOSIS — X58XXXA Exposure to other specified factors, initial encounter: Secondary | ICD-10-CM | POA: Diagnosis not present

## 2016-06-09 DIAGNOSIS — Z853 Personal history of malignant neoplasm of breast: Secondary | ICD-10-CM | POA: Diagnosis not present

## 2016-06-09 DIAGNOSIS — Z87891 Personal history of nicotine dependence: Secondary | ICD-10-CM | POA: Diagnosis not present

## 2016-06-09 DIAGNOSIS — Z85038 Personal history of other malignant neoplasm of large intestine: Secondary | ICD-10-CM | POA: Insufficient documentation

## 2016-06-09 DIAGNOSIS — N183 Chronic kidney disease, stage 3 (moderate): Secondary | ICD-10-CM | POA: Diagnosis not present

## 2016-06-09 DIAGNOSIS — Z96643 Presence of artificial hip joint, bilateral: Secondary | ICD-10-CM | POA: Diagnosis not present

## 2016-06-09 DIAGNOSIS — Z7982 Long term (current) use of aspirin: Secondary | ICD-10-CM | POA: Diagnosis not present

## 2016-06-09 DIAGNOSIS — Z79899 Other long term (current) drug therapy: Secondary | ICD-10-CM | POA: Insufficient documentation

## 2016-06-09 NOTE — ED Triage Notes (Signed)
Pt presents with daughter stating she is a Copy and now her leg will not stop bleeding. Pt denies blood thinners. Bleeding not controlled, had to apply pressure dressing, puddle of blood in triage.

## 2016-06-09 NOTE — ED Provider Notes (Signed)
82 year old female tends to pick at her skin and she reported the skin of her right lower leg about 3 days ago. Since then, it has been bleeding intermittently. Daughter is concerned that she has lost a lot of blood. On exam, she does have a skin tear of the right lower leg. She has 2+ pitting edema as well. She has a normal heart rate and is actually hypertensive. Doubt significant blood loss.  Medical screening examination/treatment/procedure(s) were conducted as a shared visit with non-physician practitioner(s) and myself.  I personally evaluated the patient during the encounter.     Delora Fuel, MD [id number]

## 2016-06-09 NOTE — ED Provider Notes (Signed)
[facility] DEPT Provider Note   CSN: [id number] Arrival date & time: 06/09/16  2136  By signing my name below, I, Macon Large, attest that this documentation has been prepared under the direction and in the presence of Gay Filler PA-C Electronically Signed: Macon Large, ED Scribe. 06/09/16. 11:41 PM.  History   Chief Complaint Chief Complaint  Patient presents with  . Skin tear   The history is provided by the patient and a relative. No language interpreter was used.   HPI Comments: Stephanie Martinez is a 82 y.o. female who presents to the Emergency Department complaining of moderate, constant, right shin pain onset two days ago. She notes feeling a burning sensation to the affected area accompanied by sharp pain to her right shin. Pt's daughter notes she picked a piece of dry and flaky skin on her right shin a couple of days ago. She reports that tonight while Pt was watching TV, the area where she had recently picked at had "exploded" and began to have drainage of blood. She notes this happened once more at home and one in the ED waiting room. She notes bleeding was brought under control with pressure and bandage. Pt notes b/l pitting edema. Pt denies fall, head trauma, or LOC. Pt's daughter denies pt being on blood thinners. She denies light-headedness, CP, SOB, nausea, vomiting, abdominal pain.   Past Medical History:  Diagnosis Date  . Arthritis   . Breast cancer ([place])    lumpectomy and radiation  . CKD (chronic kidney disease), stage III   . Colon cancer ([place])    resection  . GERD (gastroesophageal reflux disease)   . Glaucoma   . Hyperlipidemia   . Hypertension   . Meniere disease   . Osteoporosis   . Overactive thyroid gland    treated with liquid iodine  . Vertigo    doing PT     Patient Active Problem List   Diagnosis Date Noted  . Spells 09/03/2015  . Faintness 09/03/2015  . Seizures ([place]) 07/22/2015  . Upper airway cough syndrome 05/23/2015    . Vitamin D deficiency 12/05/2014  . Orthostatic hypotension 11/27/2014  . CKD (chronic kidney disease), stage III   . Allergic rhinitis 01/22/2013  . COPD bronchitis 01/05/2013  . Other abnormal glucose 09/18/2012  . Spondylosis of lumbar region without myelopathy or radiculopathy 07/27/2012  . Low back pain 04/26/2012  . Meniere disease   . GERD (gastroesophageal reflux disease)   . Osteoarthritis of hip   . Breast cancer ([place])   . Eczema 12/29/2011  . Essential hypertension, benign 08/27/2011  . Other screening mammogram 08/27/2011  . Pure hypercholesterolemia 08/27/2011  . Glaucoma 08/27/2011    Past Surgical History:  Procedure Laterality Date  . APPENDECTOMY    . BREAST SURGERY     lumpectomy-left  . ORIF HUMERUS FRACTURE Right 11/26/2014   Procedure: OPEN REDUCTION INTERNAL FIXATION (ORIF) HUMERAL SHAFT FRACTURE;  Surgeon: Altamese Wilton Manors, MD;  Location: [facility];  Service: Orthopedics;  Laterality: Right;  . SMALL INTESTINE SURGERY     resection  . SPINE SURGERY     x2  . TONSILLECTOMY AND ADENOIDECTOMY  before age 37  . TOTAL HIP ARTHROPLASTY Bilateral    bilateral one 20 years ago, other 8-10 years ago    OB History    Gravida Para Term Preterm AB Living   '2 2       2   '$ SAB TAB Ectopic Multiple Live Births  Obstetric Comments   ? If had MAB       Home Medications    Prior to Admission medications   Medication Sig Start Date End Date Taking? Authorizing Provider  acetaminophen (TYLENOL) 500 MG tablet Take 500 mg by mouth every 6 (six) hours as needed.    Historical Provider, MD  amLODipine (NORVASC) 10 MG tablet TAKE 1 TABLET BY MOUTH EVERY DAY 03/29/16   Will Jorja Loa, MD  amLODipine (NORVASC) 10 MG tablet TAKE 1 TABLET BY MOUTH EVERY DAY 04/26/16   Will Jorja Loa, MD  aspirin 81 MG tablet Take 81 mg by mouth daily.    Historical Provider, MD  calcium-vitamin D (OSCAL WITH D) 500-200 MG-UNIT tablet Take 1 tablet by mouth daily.     Historical Provider, MD  carvedilol (COREG) 12.5 MG tablet TAKE 1 TABLET BY MOUTH TWICE DAILY 03/29/16   Will Jorja Loa, MD  famotidine (PEPCID) 20 MG tablet TAKE 1 TABLET BY MOUTH EVERY DAY AFTER SUPPER 03/15/16   Nyoka Cowden, MD  furosemide (LASIX) 20 MG tablet Take 1 tablet (20 mg total) by mouth daily. Patient not taking: Reported on 03/22/2016 03/12/16   Anne Ng, NP  GLUCOSAMINE HCL PO Take 1,000 mg by mouth daily.    Historical Provider, MD  hydrocortisone (ANUSOL-HC) 2.5 % rectal cream Place 1 application rectally 3 (three) times daily. 01/20/16   Jerene Bears, MD  latanoprost (XALATAN) 0.005 % ophthalmic solution Place 1 drop into both eyes at bedtime.     Historical Provider, MD  LUTEIN PO Take 25 mg by mouth daily.    Historical Provider, MD  pantoprazole (PROTONIX) 40 MG tablet TAKE 1 TABLET BY MOUTH EVERY DAY. TAKE 30-60 MINUTES BEFORE FIRST MEAL OF THE DAY 03/30/16   Nyoka Cowden, MD  vitamin C (ASCORBIC ACID) 500 MG tablet Take 500 mg by mouth daily.    Historical Provider, MD  Vitamin D, Ergocalciferol, (DRISDOL) 50000 units CAPS capsule TAKE ONE CAPSULE BY MOUTH EVERY WEEK 03/26/16   Etta Grandchild, MD    Family History Family History  Problem Relation Age of Onset  . Stroke Mother   . Colon cancer Father     dx in 38s  . Lung cancer Sister 32  . BRCA 1/2 Daughter     BRCA2 positive  . BRCA 1/2 Daughter     BRCA2 positive  . Breast cancer Sister 72    bilateral breast cancer, 2nd dx at 27  . Colon cancer Sister 65  . Breast cancer Sister 25    bilateral breast cancer; 2nd dx age 41  . Leukemia Other     dx in his 62s, youngest sister's son  . Hypertension Other     materal side  . Breast cancer Paternal Aunt     <50  . Thyroid disease Daughter   . Heart disease Neg Hx   . Hyperlipidemia Neg Hx     Social History Social History  Substance Use Topics  . Smoking status: Former Smoker    Years: 35.00    Types: Cigarettes  . Smokeless tobacco:  Never Used     Comment: in her early 78s  . Alcohol use 3.0 - 3.6 oz/week    5 - 6 Standard drinks or equivalent per week     Comment: Wine     Allergies   Iodine   Review of Systems Review of Systems  Constitutional: Negative for fever.  Respiratory: Negative for shortness of  breath.   Cardiovascular: Negative for chest pain.  Gastrointestinal: Negative for abdominal pain, nausea and vomiting.  Skin: Positive for wound.  Neurological: Negative for syncope and light-headedness.  All other systems reviewed and are negative.    Physical Exam Updated Vital Signs BP 160/94 (BP Location: Right Arm)   Pulse 92   Temp 97.5 F (36.4 C) (Oral)   Resp 18   SpO2 98%   Physical Exam  Constitutional: She appears well-developed and well-nourished. No distress.  HENT:  Head: Normocephalic and atraumatic.  Mouth/Throat: Oropharynx is clear and moist. No oropharyngeal exudate.  Eyes: Conjunctivae and EOM are normal. Pupils are equal, round, and reactive to light. Right eye exhibits no discharge. Left eye exhibits no discharge. No scleral icterus.  Neck: Normal range of motion. Neck supple.  Cardiovascular: Normal rate, regular rhythm, normal heart sounds and intact distal pulses.  Exam reveals no gallop and no friction rub.   No murmur heard. Pulmonary/Chest: Effort normal and breath sounds normal. No respiratory distress. She has no wheezes. She has no rales.  Abdominal: Soft. Bowel sounds are normal. She exhibits no distension and no mass. There is no tenderness.  Musculoskeletal: Normal range of motion. She exhibits edema ( 2+ b/l pedal edema). She exhibits no tenderness.  Lymphadenopathy:    She has no cervical adenopathy.  Neurological: She is alert. Coordination normal.  Skin: Skin is warm and dry. No rash noted. No erythema.  Bilateral 2+ pedal edema. 2 cm superficial skin tear to her lower extremity. No evidence of infection.   Psychiatric: She has a normal mood and affect.  Her behavior is normal.  Nursing note and vitals reviewed.    ED Treatments / Results   DIAGNOSTIC STUDIES: Oxygen Saturation is 94% on RA, adequate by my interpretation.    COORDINATION OF CARE: 11:38 PM Discussed treatment plan with pt at bedside which includes f/u with PCP in a week and pt agreed to plan.   Labs (all labs ordered are listed, but only abnormal results are displayed) Labs Reviewed  HEMOGLOBIN AND HEMATOCRIT, BLOOD - Abnormal; Notable for the following:       Result Value   Hemoglobin 11.4 (*)    HCT 32.6 (*)    All other components within normal limits    EKG  EKG Interpretation None       Radiology No results found.  Procedures Procedures (including critical care time)  Medications Ordered in ED Medications - No data to display   Initial Impression / Assessment and Plan / ED Course  I have reviewed the triage vital signs and the nursing notes.  Pertinent labs & imaging results that were available during my care of the patient were reviewed by me and considered in my medical decision making (see chart for details).  Clinical Course     Patient presents to ED with complaint of skin tear to right lower extremity. Patient is afebrile and non-toxic appearing in NAD. Vital signs remarkable for mildly hypertensive. Superficial skin tear noted to right lower extremity. Bleeding is controlled at this time. No signs of secondary infection. No head trauma or LOC. No anti-coagulation therapy. Hemoglobin stable compared to previous. Pt is hypertensive. Low suspicion for significant blood loss. Wound irrigated. Damp to dry dressing applied. Wound care discussed. Encouraged follow up with PCP for re-evaluation. Return precautions provided - specifically signs of infection. Pt and daughter voiced understanding and are agreeable.     Discussed pt with Dr. Preston Fleeting, who also evaluated, agrees  with plan.   Final Clinical Impressions(s) / ED Diagnoses   Final  diagnoses:  Noninfected skin tear of right lower extremity, initial encounter    New Prescriptions Discharge Medication List as of 06/10/2016  2:34 AM      I personally performed the services described in this documentation, which was scribed in my presence. The recorded information has been reviewed and is accurate.     Roxanna Mew, [place] 00/34/96 1164    Delora Fuel, MD 35/39/12 [id number]    Delora Fuel, MD 46/21/94 [id number]

## 2016-06-09 NOTE — ED Notes (Signed)
Skin tear to right mid shin. Patient states she "picked some dry skin" bleeding controlled at time of assessment. Dried blood noted to right lower leg. Right lower leg cleaned, and pressure dressing applied to right lower leg. Patient tolerated well. Family at bedside.

## 2016-06-10 LAB — HEMOGLOBIN AND HEMATOCRIT, BLOOD
HCT: 32.6 % — ABNORMAL LOW (ref 36.0–46.0)
HEMOGLOBIN: 11.4 g/dL — AB (ref 12.0–15.0)

## 2016-06-10 NOTE — Telephone Encounter (Signed)
carvedilol (COREG) 12.5 MG tablet  Medication  Date: 03/29/2016 Department: [facility] Office Ordering/Authorizing: Will Meredith Leeds, MD  Order Providers   Prescribing Provider Encounter Provider  Will Meredith Leeds, MD Will Meredith Leeds, MD  Medication Detail    Disp Refills Start End   carvedilol (COREG) 12.5 MG tablet 180 tablet 3 03/29/2016    Sig: TAKE 1 TABLET BY MOUTH TWICE DAILY   Notes to Pharmacy: **Patient requests 90 days supply**   E-Prescribing Status: Receipt confirmed by pharmacy (03/29/2016 2:03 PM EDT)   Pharmacy   [id number] - [place], [address] AT [address]

## 2016-06-10 NOTE — Discharge Instructions (Signed)
Read the information below.  Your hemoglobin was stable. Your wound was cleaned and a dressing applied.  Please change dressing every 24 hours and assess wound. Clean wound with warm soap and water. Apply antibiotic ointment. Apply a damp dressing, followed by dry, followed up a wrap.  Please follow up with your primary doctor within one week for a re-evaluation and wound check.  If any signs of infection - redness, swelling, warmth, purulent discharge, red streaking, or fever - return to ED immediately.  Use the prescribed medication as directed.  Please discuss all new medications with your pharmacist.   You may return to the Emergency Department at any time for worsening condition or any new symptoms that concern you.

## 2016-06-10 NOTE — ED Notes (Signed)
Patient and family verbalize understanding of discharge instructions, home care and follow up care. Patient out of department at this time with family. 

## 2016-06-25 ENCOUNTER — Telehealth: Payer: Self-pay | Admitting: Internal Medicine

## 2016-06-25 NOTE — Telephone Encounter (Signed)
Patient has been exposed to whooping cough in her house abnd all of them are being treated. Patient has had a cough for a few weeks. Please advise if abx are needed for this patient.

## 2016-06-25 NOTE — Telephone Encounter (Signed)
Pt needs to be seen. Can you call her for an appt.

## 2016-06-26 ENCOUNTER — Other Ambulatory Visit: Payer: Self-pay | Admitting: Internal Medicine

## 2016-06-30 NOTE — Telephone Encounter (Signed)
tammy

## 2016-07-01 NOTE — Telephone Encounter (Signed)
Called daughter.  Daughter is getting ready to leave for [facility] to see new Grandbaby.  Will not be able to bring mother in until she comes back to town.  Daughter just wanted to be proactive with her mom since her and her husband since her and her spouse was diagnosed with whooping cough and patient had cough as well.   States does not believe this is whooping cough though.

## 2016-07-01 NOTE — Telephone Encounter (Signed)
Pt spouse was dx with whooping. Is there any precaution for patient or treatment. Pt dtr is not able to bring her in to be seen.

## 2016-07-01 NOTE — Telephone Encounter (Signed)
What does her D want me to do?

## 2016-07-02 NOTE — Telephone Encounter (Signed)
Dtr did not know if pt needed to be treated for whooping cough as well since pt spouse was dx with it recently.

## 2016-07-21 ENCOUNTER — Ambulatory Visit (INDEPENDENT_AMBULATORY_CARE_PROVIDER_SITE_OTHER): Payer: Medicare Other | Admitting: Nurse Practitioner

## 2016-07-21 ENCOUNTER — Other Ambulatory Visit: Payer: Self-pay | Admitting: Nurse Practitioner

## 2016-07-21 ENCOUNTER — Encounter: Payer: Self-pay | Admitting: Nurse Practitioner

## 2016-07-21 ENCOUNTER — Other Ambulatory Visit (INDEPENDENT_AMBULATORY_CARE_PROVIDER_SITE_OTHER): Payer: Medicare Other

## 2016-07-21 VITALS — BP 132/78 | HR 82 | Temp 97.3°F | Ht 62.0 in | Wt 129.0 lb

## 2016-07-21 DIAGNOSIS — R600 Localized edema: Secondary | ICD-10-CM

## 2016-07-21 LAB — BASIC METABOLIC PANEL
BUN: 22 mg/dL (ref 6–23)
CALCIUM: 9.6 mg/dL (ref 8.4–10.5)
CO2: 26 meq/L (ref 19–32)
Chloride: 105 mEq/L (ref 96–112)
Creatinine, Ser: 1.45 mg/dL — ABNORMAL HIGH (ref 0.40–1.20)
GFR: 35.84 mL/min — ABNORMAL LOW (ref >60.00)
Glucose, Bld: 106 mg/dL — ABNORMAL HIGH (ref 70–99)
Potassium: 3.9 mEq/L (ref 3.5–5.1)
SODIUM: 140 meq/L (ref 135–145)

## 2016-07-21 MED ORDER — FUROSEMIDE 20 MG PO TABS: 20.0000 mg | ORAL_TABLET | Freq: Every day | ORAL | 0 refills | Status: DC

## 2016-07-21 NOTE — Progress Notes (Signed)
Pre visit review using our clinic review tool, if applicable. No additional management support is needed unless otherwise documented below in the visit note. 

## 2016-07-21 NOTE — Progress Notes (Signed)
Subjective:  Patient ID: Stephanie Martinez, female    DOB: Apr 01, 1922  Age: 83 y.o. MRN: [id number]  CC: Leg Swelling (left leg swelling,painful,burning, hard to walk. had venour doppler=normal. going on for a little but getting bad since last month.  )   HPI Patient in office with daughter  LE EDEMA: Presents with persistent LE edema, worse in walking and at the end of the day. Improves with elevation and use of compression stocking. Has not used compression in last 2weeks. Denies any fever or redness, no claudication.  Outpatient Medications Prior to Visit  Medication Sig Dispense Refill  . acetaminophen (TYLENOL) 500 MG tablet Take 500 mg by mouth every 6 (six) hours as needed.    . calcium-vitamin D (OSCAL WITH D) 500-200 MG-UNIT tablet Take 1 tablet by mouth daily.    . carvedilol (COREG) 12.5 MG tablet TAKE 1 TABLET BY MOUTH TWICE DAILY 180 tablet 3  . famotidine (PEPCID) 20 MG tablet TAKE 1 TABLET BY MOUTH EVERY DAY AFTER SUPPER 90 tablet 1  . GLUCOSAMINE HCL PO Take 1,000 mg by mouth daily.    Marland Kitchen latanoprost (XALATAN) 0.005 % ophthalmic solution Place 1 drop into both eyes at bedtime.     . LUTEIN PO Take 25 mg by mouth daily.    . pantoprazole (PROTONIX) 40 MG tablet TAKE 1 TABLET BY MOUTH EVERY DAY. TAKE 30-60 MINUTES BEFORE FIRST MEAL OF THE DAY 90 tablet 0  . vitamin C (ASCORBIC ACID) 500 MG tablet Take 500 mg by mouth daily.    . Vitamin D, Ergocalciferol, (DRISDOL) 50000 units CAPS capsule TAKE ONE CAPSULE BY MOUTH EVERY WEEK 12 capsule 3  . amLODipine (NORVASC) 10 MG tablet TAKE 1 TABLET BY MOUTH EVERY DAY 30 tablet 8  . aspirin 81 MG tablet Take 81 mg by mouth daily.    Marland Kitchen amLODipine (NORVASC) 10 MG tablet TAKE 1 TABLET BY MOUTH EVERY DAY (Patient not taking: Reported on 07/21/2016) 90 tablet 3  . furosemide (LASIX) 20 MG tablet Take 1 tablet (20 mg total) by mouth daily. (Patient not taking: Reported on 03/22/2016) 3 tablet 3  . hydrocortisone (ANUSOL-HC) 2.5 % rectal  cream Place 1 application rectally 3 (three) times daily. (Patient not taking: Reported on 07/21/2016) 30 g 1   No facility-administered medications prior to visit.     ROS See HPI  Objective:  BP 132/78   Pulse 82   Temp 97.3 F (36.3 C)   Ht 5\' 2"  (1.575 m)   Wt 129 lb (58.5 kg)   SpO2 96%   BMI 23.59 kg/m   BP Readings from Last 3 Encounters:  07/21/16 132/78  06/10/16 160/94  03/22/16 126/84    Wt Readings from Last 3 Encounters:  07/21/16 129 lb (58.5 kg)  03/04/16 129 lb 1.9 oz (58.6 kg)  01/22/16 125 lb (56.7 kg)    Physical Exam  Constitutional: She is oriented to person, place, and time.  Cardiovascular: Normal rate.   Pulmonary/Chest: Effort normal.  Musculoskeletal: She exhibits edema.  Bilateral LE edema (pitting) L>R.  Neurological: She is alert and oriented to person, place, and time.  Skin: Skin is warm and dry. No erythema.    Lab Results  Component Value Date   WBC 8.1 01/22/2016   HGB 11.4 (L) 06/10/2016   HCT 32.6 (L) 06/10/2016   PLT 346.0 01/22/2016   GLUCOSE 106 (H) 07/21/2016   CHOL 155 11/26/2014   TRIG 102 11/26/2014   HDL 33 (L) 11/26/2014  LDLDIRECT 124.0 11/14/2014   LDLCALC 102 (H) 11/26/2014   ALT 15 11/26/2014   AST 19 11/26/2014   NA 140 07/21/2016   K 3.9 07/21/2016   CL 105 07/21/2016   CREATININE 1.45 (H) 07/21/2016   BUN 22 07/21/2016   CO2 26 07/21/2016   TSH 2.05 11/14/2014   INR 1.11 11/25/2014   HGBA1C 5.7 09/18/2012    No results found.  Assessment & Plan:   Stephanie Martinez was seen today for leg swelling.  Diagnoses and all orders for this visit:  Lower leg edema -     Basic metabolic panel; Future -     furosemide (LASIX) 20 MG tablet; Take 1 tablet (20 mg total) by mouth daily.   I have discontinued Stephanie Martinez's hydrocortisone, furosemide, and amLODipine. I have also changed her amLODipine. Additionally, I am having her start on furosemide. Lastly, I am having her maintain her latanoprost, LUTEIN PO,  aspirin, GLUCOSAMINE HCL PO, vitamin C, calcium-vitamin D, acetaminophen, famotidine, Vitamin D (Ergocalciferol), carvedilol, and pantoprazole.  Meds ordered this encounter  Medications  . amLODipine (NORVASC) 10 MG tablet    Sig: Take 1 tablet (10 mg total) by mouth at bedtime.    Dispense:  30 tablet    Refill:  8    Order Specific Question:   Supervising Provider    Answer:   Cassandria Anger [[id number]]  . furosemide (LASIX) 20 MG tablet    Sig: Take 1 tablet (20 mg total) by mouth daily.    Dispense:  3 tablet    Refill:  0    Order Specific Question:   Supervising Provider    Answer:   Cassandria Anger [[id number]]    Follow-up: Return if symptoms worsen or fail to improve.  Wilfred Lacy, NP

## 2016-07-21 NOTE — Patient Instructions (Addendum)
Take amlodipine at bedtime. Wear compression stocking during the day and off at night. Go to basement for repeat BMP. If kidney kidney function is stable, will prescribed additional lasix x 3days.

## 2016-07-22 NOTE — Telephone Encounter (Signed)
Routing to [place]---patient is requesting 90 day supply---your rx was for 3 tab---are you ok with 90 day supply----please advise, thanks

## 2016-09-11 ENCOUNTER — Other Ambulatory Visit: Payer: Self-pay | Admitting: Internal Medicine

## 2016-09-13 ENCOUNTER — Other Ambulatory Visit: Payer: Self-pay | Admitting: Internal Medicine

## 2016-09-14 ENCOUNTER — Telehealth: Payer: Self-pay | Admitting: Internal Medicine

## 2016-09-14 MED ORDER — PANTOPRAZOLE SODIUM 40 MG PO TBEC: DELAYED_RELEASE_TABLET | ORAL | 0 refills | Status: DC

## 2016-09-14 NOTE — Telephone Encounter (Signed)
Really should be coming back to me if still needing cough meds which means the ppi aren't working - if just needing ppi should use her PCP for further refills

## 2016-09-14 NOTE — Telephone Encounter (Signed)
Pts daughter called needing a refill for patients Pantoprazole Rx. Was refused previously; after reviewing chart it appears it was denied under MW and should go through her PCP. Daughter is aware that I will send 1 time Rx so patient does not go without medication and also have MW advise if he wants to continue filling for patient due to cough syndrome or go through her PCP.   MW please advise. Thanks.

## 2016-09-14 NOTE — Telephone Encounter (Signed)
Daughter is aware of recs from [facility]. Nothing more needed at this time.

## 2016-10-01 ENCOUNTER — Other Ambulatory Visit: Payer: Self-pay | Admitting: Internal Medicine

## 2016-12-16 ENCOUNTER — Other Ambulatory Visit: Payer: Self-pay | Admitting: Internal Medicine

## 2017-02-17 ENCOUNTER — Other Ambulatory Visit: Payer: Self-pay | Admitting: Cardiology

## 2017-02-17 NOTE — Telephone Encounter (Signed)
Medication Detail    Disp Refills Start End   amLODipine (NORVASC) 10 MG tablet 15 tablet 0 02/17/2017    Sig: TAKE 1 TABLET BY MOUTH EVERY DAY   Sent to pharmacy as: amLODipine (NORVASC) 10 MG tablet   Notes to Pharmacy: Please call our office to schedule an overdue yearly appointment before anymore refills. [phone]. Thank you 2nd attempt   E-Prescribing Status: Receipt confirmed by pharmacy (02/17/2017 9:40 AM EDT)   Pharmacy   [id number] - [place], [address] AT [address]

## 2017-03-07 ENCOUNTER — Telehealth: Payer: Self-pay | Admitting: Internal Medicine

## 2017-03-07 DIAGNOSIS — N6310 Unspecified lump in the right breast, unspecified quadrant: Secondary | ICD-10-CM

## 2017-03-07 NOTE — Telephone Encounter (Signed)
Patient daughter called and said her mother had a big knot in her R breast. They spoke with the breast center, they are requesting a referral to be send over. She stated it needed to be diagnostic orders.

## 2017-03-07 NOTE — Telephone Encounter (Signed)
yes

## 2017-03-08 ENCOUNTER — Other Ambulatory Visit: Payer: Self-pay | Admitting: Internal Medicine

## 2017-03-08 NOTE — Telephone Encounter (Signed)
Order has been placed.

## 2017-03-08 NOTE — Telephone Encounter (Signed)
This has been reentered.  

## 2017-03-08 NOTE — Telephone Encounter (Signed)
Can you please change order to bilateral mammo /  IMG [phone] and will also need an order for right breast US / IMG [phone]

## 2017-03-09 ENCOUNTER — Other Ambulatory Visit: Payer: Self-pay | Admitting: Internal Medicine

## 2017-03-09 DIAGNOSIS — N63 Unspecified lump in unspecified breast: Secondary | ICD-10-CM

## 2017-03-09 DIAGNOSIS — N6320 Unspecified lump in the left breast, unspecified quadrant: Secondary | ICD-10-CM

## 2017-03-13 ENCOUNTER — Other Ambulatory Visit: Payer: Self-pay | Admitting: Internal Medicine

## 2017-03-14 ENCOUNTER — Ambulatory Visit
Admission: RE | Admit: 2017-03-14 | Discharge: 2017-03-14 | Disposition: A | Discharge status: Home / Self Care | Payer: Medicare Other | Source: Ambulatory Visit | Attending: Internal Medicine | Admitting: Internal Medicine

## 2017-03-14 DIAGNOSIS — N6320 Unspecified lump in the left breast, unspecified quadrant: Secondary | ICD-10-CM

## 2017-03-14 DIAGNOSIS — N63 Unspecified lump in unspecified breast: Secondary | ICD-10-CM

## 2017-03-14 LAB — HM MAMMOGRAPHY

## 2017-03-15 ENCOUNTER — Telehealth: Payer: Self-pay | Admitting: Internal Medicine

## 2017-03-15 ENCOUNTER — Other Ambulatory Visit: Payer: Self-pay | Admitting: Internal Medicine

## 2017-03-15 MED ORDER — PANTOPRAZOLE SODIUM 40 MG PO TBEC: DELAYED_RELEASE_TABLET | ORAL | 0 refills | Status: DC

## 2017-03-15 NOTE — Telephone Encounter (Signed)
Pt daughter called for a refill of the pts pantoprazole (PROTONIX) 40 MG tablet  CPE set up for 10/22 Please advise

## 2017-03-15 NOTE — Telephone Encounter (Signed)
Pt daughter informed of med sent in.

## 2017-03-15 NOTE — Telephone Encounter (Signed)
Per office policy sent 30 day to local pharmacy until appt.../lmb  

## 2017-03-31 ENCOUNTER — Other Ambulatory Visit: Payer: Self-pay | Admitting: Cardiology

## 2017-04-11 ENCOUNTER — Encounter: Payer: Self-pay | Admitting: Internal Medicine

## 2017-04-11 ENCOUNTER — Ambulatory Visit (INDEPENDENT_AMBULATORY_CARE_PROVIDER_SITE_OTHER): Payer: Medicare Other | Admitting: Internal Medicine

## 2017-04-11 ENCOUNTER — Other Ambulatory Visit (INDEPENDENT_AMBULATORY_CARE_PROVIDER_SITE_OTHER): Payer: Medicare Other

## 2017-04-11 VITALS — BP 136/78 | HR 72 | Temp 97.6°F | Ht 62.0 in | Wt 128.0 lb

## 2017-04-11 DIAGNOSIS — N183 Chronic kidney disease, stage 3 unspecified: Secondary | ICD-10-CM

## 2017-04-11 DIAGNOSIS — I10 Essential (primary) hypertension: Secondary | ICD-10-CM

## 2017-04-11 DIAGNOSIS — E7800 Pure hypercholesterolemia, unspecified: Secondary | ICD-10-CM

## 2017-04-11 DIAGNOSIS — Z23 Encounter for immunization: Secondary | ICD-10-CM | POA: Diagnosis not present

## 2017-04-11 LAB — COMPREHENSIVE METABOLIC PANEL
ALT: 10 U/L (ref 0–35)
AST: 18 U/L (ref 0–37)
Albumin: 4.2 g/dL (ref 3.5–5.2)
Alkaline Phosphatase: 88 U/L (ref 39–117)
BUN: 23 mg/dL (ref 6–23)
CHLORIDE: 102 meq/L (ref 96–112)
CO2: 26 meq/L (ref 19–32)
Calcium: 9.4 mg/dL (ref 8.4–10.5)
Creatinine, Ser: 1.34 mg/dL — ABNORMAL HIGH (ref 0.40–1.20)
GFR: 39.2 mL/min — AB (ref >60.00)
Glucose, Bld: 89 mg/dL (ref 70–99)
Potassium: 4 mEq/L (ref 3.5–5.1)
Sodium: 138 mEq/L (ref 135–145)
TOTAL PROTEIN: 7.1 g/dL (ref 6.0–8.3)
Total Bilirubin: 0.4 mg/dL (ref 0.2–1.2)

## 2017-04-11 LAB — URINALYSIS, ROUTINE W REFLEX MICROSCOPIC
Bilirubin Urine: NEGATIVE
HGB URINE DIPSTICK: NEGATIVE
Ketones, ur: NEGATIVE
Leukocytes, UA: NEGATIVE
Nitrite: NEGATIVE
RBC / HPF: NONE SEEN (ref 0–?)
Total Protein, Urine: 30 — AB
Urine Glucose: NEGATIVE
Urobilinogen, UA: 0.2 (ref 0.0–1.0)
pH: 6 (ref 5.0–8.0)

## 2017-04-11 LAB — CBC WITH DIFFERENTIAL/PLATELET
BASOS PCT: 1.3 % (ref 0.0–3.0)
Basophils Absolute: 0.1 10*3/uL (ref 0.0–0.1)
EOS ABS: 0.4 10*3/uL (ref 0.0–0.7)
Eosinophils Relative: 5.8 % — ABNORMAL HIGH (ref 0.0–5.0)
HEMATOCRIT: 36.5 % (ref 36.0–46.0)
Hemoglobin: 12.3 g/dL (ref 12.0–15.0)
LYMPHS ABS: 1.4 10*3/uL (ref 0.7–4.0)
LYMPHS PCT: 18.8 % (ref 12.0–46.0)
MCHC: 33.8 g/dL (ref 30.0–36.0)
MCV: 94.2 fl (ref 78.0–100.0)
MONOS PCT: 8.5 % (ref 3.0–12.0)
Monocytes Absolute: 0.6 10*3/uL (ref 0.1–1.0)
NEUTROS ABS: 4.9 10*3/uL (ref 1.4–7.7)
NEUTROS PCT: 65.6 % (ref 43.0–77.0)
PLATELETS: 278 10*3/uL (ref 150.0–400.0)
RBC: 3.87 Mil/uL (ref 3.87–5.11)
RDW: 13.1 % (ref 11.5–15.5)
WBC: 7.5 10*3/uL (ref 4.0–10.5)

## 2017-04-11 LAB — LDL CHOLESTEROL, DIRECT: LDL DIRECT: 118 mg/dL

## 2017-04-11 LAB — LIPID PANEL
CHOL/HDL RATIO: 5
CHOLESTEROL: 214 mg/dL — AB (ref 0–200)
HDL: 46 mg/dL (ref >39.00)
NONHDL: 167.88
TRIGLYCERIDES: 278 mg/dL — AB (ref 0.0–149.0)
VLDL: 55.6 mg/dL — ABNORMAL HIGH (ref 0.0–40.0)

## 2017-04-11 LAB — TSH: TSH: 2.66 u[IU]/mL (ref 0.35–4.50)

## 2017-04-11 MED ORDER — CARVEDILOL 12.5 MG PO TABS: 12.5000 mg | ORAL_TABLET | Freq: Two times a day (BID) | ORAL | 1 refills | Status: DC

## 2017-04-11 MED ORDER — AMLODIPINE BESYLATE 10 MG PO TABS: 10.0000 mg | ORAL_TABLET | Freq: Every day | ORAL | 1 refills | Status: DC

## 2017-04-11 NOTE — Patient Instructions (Signed)

## 2017-04-11 NOTE — Progress Notes (Signed)
Subjective:  Patient ID: Stephanie Martinez, female    DOB: May 01, 1922  Age: 83 y.o. MRN: [id number]  CC: Hypertension   HPI Stephanie Martinez presents for a BP check - she tells me her blood pressure has been well controlled.  She feels well today and offers no complaints.  Outpatient Medications Prior to Visit  Medication Sig Dispense Refill  . acetaminophen (TYLENOL) 500 MG tablet Take 500 mg by mouth every 6 (six) hours as needed.    Marland Kitchen aspirin 81 MG tablet Take 81 mg by mouth daily.    . calcium-vitamin D (OSCAL WITH D) 500-200 MG-UNIT tablet Take 1 tablet by mouth daily.    . famotidine (PEPCID) 20 MG tablet TAKE 1 TABLET BY MOUTH EVERY DAY AFTER SUPPER 90 tablet 1  . GLUCOSAMINE HCL PO Take 1,000 mg by mouth daily.    Marland Kitchen latanoprost (XALATAN) 0.005 % ophthalmic solution Place 1 drop into both eyes at bedtime.     . LUTEIN PO Take 25 mg by mouth daily.    . pantoprazole (PROTONIX) 40 MG tablet TAKE 1 TABLET BY MOUTH EVERY DAY 30 TO 60 MINUTES BEFORE FIRST MEAL OF THE DAY 90 tablet 1  . vitamin C (ASCORBIC ACID) 500 MG tablet Take 500 mg by mouth daily.    . Vitamin D, Ergocalciferol, (DRISDOL) 50000 units CAPS capsule TAKE ONE CAPSULE BY MOUTH EVERY WEEK 12 capsule 0  . amLODipine (NORVASC) 10 MG tablet TAKE 1 TABLET BY MOUTH EVERY DAY 15 tablet 0  . carvedilol (COREG) 12.5 MG tablet TAKE 1 TABLET BY MOUTH TWICE DAILY 180 tablet 3  . furosemide (LASIX) 20 MG tablet Take 1 tablet (20 mg total) by mouth daily. 3 tablet 0   No facility-administered medications prior to visit.     ROS Review of Systems  Constitutional: Negative.  Negative for chills, diaphoresis, fatigue and fever.  HENT: Negative.   Eyes: Negative.   Respiratory: Negative.  Negative for cough, chest tightness, shortness of breath and wheezing.   Cardiovascular: Negative.  Negative for chest pain, palpitations and leg swelling.  Gastrointestinal: Negative for abdominal pain, blood in stool, constipation,  diarrhea, nausea and vomiting.  Endocrine: Negative.   Genitourinary: Negative for decreased urine volume, difficulty urinating, hematuria and urgency.  Musculoskeletal: Negative.   Skin: Negative.   Allergic/Immunologic: Negative.   Neurological: Negative.  Negative for dizziness, weakness and headaches.  Hematological: Negative for adenopathy. Does not bruise/bleed easily.  Psychiatric/Behavioral: Negative.     Objective:  BP 136/78 (BP Location: Left Arm, Patient Position: Sitting, Cuff Size: Normal)   Pulse 72   Temp 97.6 F (36.4 C) (Oral)   Ht 5\' 2"  (1.575 m)   Wt 128 lb (58.1 kg)   SpO2 97%   BMI 23.41 kg/m   BP Readings from Last 3 Encounters:  04/11/17 136/78  07/21/16 132/78  06/10/16 160/94    Wt Readings from Last 3 Encounters:  04/11/17 128 lb (58.1 kg)  07/21/16 129 lb (58.5 kg)  03/04/16 129 lb 1.9 oz (58.6 kg)    Physical Exam  Constitutional: She is oriented to person, place, and time. No distress.  HENT:  Mouth/Throat: Oropharynx is clear and moist. No oropharyngeal exudate.  Eyes: Conjunctivae are normal. Right eye exhibits no discharge. Left eye exhibits no discharge. No scleral icterus.  Neck: Normal range of motion. Neck supple. No JVD present. No thyromegaly present.  Cardiovascular: Normal rate, regular rhythm and intact distal pulses.  Exam reveals no gallop and  no friction rub.   No murmur heard. Pulmonary/Chest: Effort normal and breath sounds normal. No respiratory distress. She has no wheezes. She has no rales. She exhibits no tenderness.  Abdominal: Soft. Bowel sounds are normal. She exhibits no distension and no mass. There is no tenderness. There is no rebound and no guarding.  Musculoskeletal: Normal range of motion. She exhibits no edema, tenderness or deformity.  Lymphadenopathy:    She has no cervical adenopathy.  Neurological: She is alert and oriented to person, place, and time.  Skin: Skin is warm and dry. No rash noted. She is  not diaphoretic. No erythema. No pallor.  Vitals reviewed.   Lab Results  Component Value Date   WBC 7.5 04/11/2017   HGB 12.3 04/11/2017   HCT 36.5 04/11/2017   PLT 278.0 04/11/2017   GLUCOSE 89 04/11/2017   CHOL 214 (H) 04/11/2017   TRIG 278.0 (H) 04/11/2017   HDL 46.00 04/11/2017   LDLDIRECT 118.0 04/11/2017   LDLCALC 102 (H) 11/26/2014   ALT 10 04/11/2017   AST 18 04/11/2017   NA 138 04/11/2017   K 4.0 04/11/2017   CL 102 04/11/2017   CREATININE 1.34 (H) 04/11/2017   BUN 23 04/11/2017   CO2 26 04/11/2017   TSH 2.66 04/11/2017   INR 1.11 11/25/2014   HGBA1C 5.7 09/18/2012    US Breast Ltd Uni Left Inc Axilla  Result Date: 03/14/2017 CLINICAL DATA:  Patient states that she had swelling and erythema in the 2 o'clock region of the left breast that has resolved. History of left breast cancer status post lumpectomy in 2008. Patient had a benign left breast biopsy in 2017. EXAM: 2D DIGITAL DIAGNOSTIC BILATERAL MAMMOGRAM WITH CAD AND ADJUNCT TOMO ULTRASOUND LEFT BREAST COMPARISON:  Previous exam(s). ACR Breast Density Category c: The breast tissue is heterogeneously dense, which may obscure small masses. FINDINGS: No suspicious mass, malignant type microcalcifications or distortion detected in either breast. Mammographic images were processed with CAD. On physical exam, I do not palpate a discrete mass in the 2 o'clock region of the left breast 10 cm from the nipple. There is no erythema of the breast. Targeted ultrasound is performed, showing normal tissue in the left breast at 2 o'clock 10 cm from the nipple. No solid or cystic mass, abnormal shadowing or distortion visualized. IMPRESSION: No evidence of malignancy in either breast. RECOMMENDATION: Bilateral screening mammogram in 1 year is recommended. I have discussed the findings and recommendations with the patient. Results were also provided in writing at the conclusion of the visit. If applicable, a reminder letter will be sent to  the patient regarding the next appointment. BI-RADS CATEGORY  2: Benign. Electronically Signed   By: Lillia Mountain M.D.   On: 03/14/2017 15:43   Mm Diag Breast Tomo Bilateral  Result Date: 03/14/2017 CLINICAL DATA:  Patient states that she had swelling and erythema in the 2 o'clock region of the left breast that has resolved. History of left breast cancer status post lumpectomy in 2008. Patient had a benign left breast biopsy in 2017. EXAM: 2D DIGITAL DIAGNOSTIC BILATERAL MAMMOGRAM WITH CAD AND ADJUNCT TOMO ULTRASOUND LEFT BREAST COMPARISON:  Previous exam(s). ACR Breast Density Category c: The breast tissue is heterogeneously dense, which may obscure small masses. FINDINGS: No suspicious mass, malignant type microcalcifications or distortion detected in either breast. Mammographic images were processed with CAD. On physical exam, I do not palpate a discrete mass in the 2 o'clock region of the left breast 10 cm from  the nipple. There is no erythema of the breast. Targeted ultrasound is performed, showing normal tissue in the left breast at 2 o'clock 10 cm from the nipple. No solid or cystic mass, abnormal shadowing or distortion visualized. IMPRESSION: No evidence of malignancy in either breast. RECOMMENDATION: Bilateral screening mammogram in 1 year is recommended. I have discussed the findings and recommendations with the patient. Results were also provided in writing at the conclusion of the visit. If applicable, a reminder letter will be sent to the patient regarding the next appointment. BI-RADS CATEGORY  2: Benign. Electronically Signed   By: Lillia Mountain M.D.   On: 03/14/2017 15:43    Assessment & Plan:   Birtha was seen today for hypertension.  Diagnoses and all orders for this visit:  Essential hypertension, benign- her blood pressure is well controlled.  Electrolytes are normal, renal function is stable. -     Comprehensive metabolic panel; Future -     CBC with Differential/Platelet;  Future -     carvedilol (COREG) 12.5 MG tablet; Take 1 tablet (12.5 mg total) by mouth 2 (two) times daily. -     amLODipine (NORVASC) 10 MG tablet; Take 1 tablet (10 mg total) by mouth daily.  Pure hypercholesterolemia-statin therapy is not indicated. -     Lipid panel; Future -     TSH; Future  CKD (chronic kidney disease), stage III ([place])- her renal function is stable.  Will continue to maintain strict blood pressure control.  She will avoid nephrotoxic agents. -     Comprehensive metabolic panel; Future -     Urinalysis, Routine w reflex microscopic; Future  Need for influenza vaccination -     Flu vaccine HIGH DOSE PF (Fluzone High dose)  Need for pneumococcal vaccination -     Pneumococcal polysaccharide vaccine 23-valent greater than or equal to 2yo subcutaneous/IM   I have discontinued Ms. Surles's furosemide. I have also changed her carvedilol and amLODipine. Additionally, I am having her maintain her latanoprost, LUTEIN PO, aspirin, GLUCOSAMINE HCL PO, vitamin C, calcium-vitamin D, acetaminophen, famotidine, Vitamin D (Ergocalciferol), and pantoprazole.  Meds ordered this encounter  Medications  . carvedilol (COREG) 12.5 MG tablet    Sig: Take 1 tablet (12.5 mg total) by mouth 2 (two) times daily.    Dispense:  180 tablet    Refill:  1    **Patient requests 90 days supply**  . amLODipine (NORVASC) 10 MG tablet    Sig: Take 1 tablet (10 mg total) by mouth daily.    Dispense:  90 tablet    Refill:  1     Follow-up: Return in about 6 months (around 10/10/2017).  Scarlette Calico, MD

## 2017-04-12 ENCOUNTER — Telehealth: Payer: Self-pay | Admitting: Internal Medicine

## 2017-04-12 NOTE — Telephone Encounter (Signed)
Called and spoke to dtr about pt's symptoms.   Informed to look for warm and raised area and also use a warm compress to help with the pain. Informed to come in if the pain and look of location begin to change appearance.

## 2017-04-12 NOTE — Telephone Encounter (Signed)
Pt is having a reaction to her pneumonia shot, she is very weak and and is having arm pain  Please call back in regard

## 2017-09-01 ENCOUNTER — Other Ambulatory Visit: Payer: Self-pay | Admitting: Internal Medicine

## 2017-09-30 ENCOUNTER — Other Ambulatory Visit: Payer: Self-pay | Admitting: Internal Medicine

## 2017-09-30 DIAGNOSIS — I10 Essential (primary) hypertension: Secondary | ICD-10-CM

## 2017-10-10 ENCOUNTER — Ambulatory Visit: Payer: Medicare Other | Admitting: Internal Medicine

## 2017-10-10 ENCOUNTER — Encounter: Payer: Self-pay | Admitting: Internal Medicine

## 2017-10-10 VITALS — BP 154/84 | HR 76 | Temp 97.7°F | Resp 16 | Wt 129.0 lb

## 2017-10-10 DIAGNOSIS — I10 Essential (primary) hypertension: Secondary | ICD-10-CM

## 2017-10-10 DIAGNOSIS — J449 Chronic obstructive pulmonary disease, unspecified: Secondary | ICD-10-CM

## 2017-10-10 NOTE — Patient Instructions (Signed)

## 2017-10-10 NOTE — Progress Notes (Signed)
Subjective:  Patient ID: Stephanie Martinez, female    DOB: [birth year]  Age: 84 y.o. MRN: [id number]  CC: Hypertension and COPD   HPI Stephanie Martinez presents for f/up -she is with her daughter today.  She is doing well and offers no complaints.  She has rare cough.  She denies any recent episodes of chest pain, hemoptysis, abdominal pain, fever, or chills.  Outpatient Medications Prior to Visit  Medication Sig Dispense Refill  . acetaminophen (TYLENOL) 500 MG tablet Take 500 mg by mouth every 6 (six) hours as needed.    Marland Kitchen amLODipine (NORVASC) 10 MG tablet Take 1 tablet (10 mg total) by mouth daily. 90 tablet 1  . aspirin 81 MG tablet Take 81 mg by mouth daily.    . calcium-vitamin D (OSCAL WITH D) 500-200 MG-UNIT tablet Take 1 tablet by mouth daily.    . carvedilol (COREG) 12.5 MG tablet Take 1 tablet (12.5 mg total) by mouth 2 (two) times daily. 180 tablet 1  . famotidine (PEPCID) 20 MG tablet TAKE 1 TABLET BY MOUTH EVERY DAY AFTER SUPPER 90 tablet 1  . GLUCOSAMINE HCL PO Take 1,000 mg by mouth daily.    Marland Kitchen latanoprost (XALATAN) 0.005 % ophthalmic solution Place 1 drop into both eyes at bedtime.     . LUTEIN PO Take 25 mg by mouth daily.    . pantoprazole (PROTONIX) 40 MG tablet TAKE 1 TABLET BY MOUTH EVERY DAY 30 TO 60 MINUTES BEFORE FIRST MEAL OF THE DAY 90 tablet 1  . vitamin C (ASCORBIC ACID) 500 MG tablet Take 500 mg by mouth daily.    . Vitamin D, Ergocalciferol, (DRISDOL) 50000 units CAPS capsule TAKE ONE CAPSULE BY MOUTH EVERY WEEK 12 capsule 1   No facility-administered medications prior to visit.     ROS Review of Systems  Constitutional: Negative.  Negative for appetite change, diaphoresis, fatigue and fever.  HENT: Negative.   Eyes: Negative for visual disturbance.  Respiratory: Positive for cough. Negative for chest tightness, shortness of breath and wheezing.   Cardiovascular: Negative for chest pain, palpitations and leg swelling.  Gastrointestinal: Negative  for abdominal pain, constipation, diarrhea, nausea and vomiting.  Genitourinary: Negative.  Negative for difficulty urinating.  Musculoskeletal: Negative.  Negative for arthralgias, back pain, myalgias and neck pain.  Skin: Negative.  Negative for color change, pallor and rash.  Neurological: Negative for dizziness, weakness, light-headedness and headaches.  Hematological: Negative for adenopathy. Does not bruise/bleed easily.  Psychiatric/Behavioral: Negative.     Objective:  BP (!) 154/84   Pulse 76   Temp 97.7 F (36.5 C) (Oral)   Resp 16   Wt 129 lb (58.5 kg)   SpO2 94%   BMI 23.59 kg/m   BP Readings from Last 3 Encounters:  10/10/17 (!) 154/84  04/11/17 136/78  07/21/16 132/78    Wt Readings from Last 3 Encounters:  10/10/17 129 lb (58.5 kg)  04/11/17 128 lb (58.1 kg)  07/21/16 129 lb (58.5 kg)    Physical Exam  Constitutional: She is oriented to person, place, and time. Vital signs are normal. No distress.  HENT:  Mouth/Throat: Oropharynx is clear and moist. No oropharyngeal exudate.  Eyes: Conjunctivae are normal. Left eye exhibits no discharge. No scleral icterus.  Neck: Normal range of motion. Neck supple. No JVD present. No thyromegaly present.  Cardiovascular: Normal rate, regular rhythm and normal heart sounds. Exam reveals no gallop and no friction rub.  No murmur heard. Pulmonary/Chest: Effort normal and  breath sounds normal. No stridor. No respiratory distress. She has no wheezes. She has no rales.  Abdominal: Soft. Bowel sounds are normal. She exhibits no distension and no mass. There is no tenderness.  Musculoskeletal: Normal range of motion. She exhibits no edema, tenderness or deformity.  Lymphadenopathy:    She has no cervical adenopathy.  Neurological: She is alert and oriented to person, place, and time.  Skin: Skin is warm and dry. No rash noted. She is not diaphoretic. No erythema. No pallor.  Psychiatric: She has a normal mood and affect. Her  behavior is normal. Judgment and thought content normal.    Lab Results  Component Value Date   WBC 7.5 04/11/2017   HGB 12.3 04/11/2017   HCT 36.5 04/11/2017   PLT 278.0 04/11/2017   GLUCOSE 89 04/11/2017   CHOL 214 (H) 04/11/2017   TRIG 278.0 (H) 04/11/2017   HDL 46.00 04/11/2017   LDLDIRECT 118.0 04/11/2017   LDLCALC 102 (H) 11/26/2014   ALT 10 04/11/2017   AST 18 04/11/2017   NA 138 04/11/2017   K 4.0 04/11/2017   CL 102 04/11/2017   CREATININE 1.34 (H) 04/11/2017   BUN 23 04/11/2017   CO2 26 04/11/2017   TSH 2.66 04/11/2017   INR 1.11 11/25/2014   HGBA1C 5.7 09/18/2012    US Breast Ltd Uni Left Inc Axilla  Result Date: 03/14/2017 CLINICAL DATA:  Patient states that she had swelling and erythema in the 2 o'clock region of the left breast that has resolved. History of left breast cancer status post lumpectomy in 2008. Patient had a benign left breast biopsy in 2017. EXAM: 2D DIGITAL DIAGNOSTIC BILATERAL MAMMOGRAM WITH CAD AND ADJUNCT TOMO ULTRASOUND LEFT BREAST COMPARISON:  Previous exam(s). ACR Breast Density Category c: The breast tissue is heterogeneously dense, which may obscure small masses. FINDINGS: No suspicious mass, malignant type microcalcifications or distortion detected in either breast. Mammographic images were processed with CAD. On physical exam, I do not palpate a discrete mass in the 2 o'clock region of the left breast 10 cm from the nipple. There is no erythema of the breast. Targeted ultrasound is performed, showing normal tissue in the left breast at 2 o'clock 10 cm from the nipple. No solid or cystic mass, abnormal shadowing or distortion visualized. IMPRESSION: No evidence of malignancy in either breast. RECOMMENDATION: Bilateral screening mammogram in 1 year is recommended. I have discussed the findings and recommendations with the patient. Results were also provided in writing at the conclusion of the visit. If applicable, a reminder letter will be sent to  the patient regarding the next appointment. BI-RADS CATEGORY  2: Benign. Electronically Signed   By: Lillia Mountain M.D.   On: 03/14/2017 15:43   Mm Diag Breast Tomo Bilateral  Result Date: 03/14/2017 CLINICAL DATA:  Patient states that she had swelling and erythema in the 2 o'clock region of the left breast that has resolved. History of left breast cancer status post lumpectomy in 2008. Patient had a benign left breast biopsy in 2017. EXAM: 2D DIGITAL DIAGNOSTIC BILATERAL MAMMOGRAM WITH CAD AND ADJUNCT TOMO ULTRASOUND LEFT BREAST COMPARISON:  Previous exam(s). ACR Breast Density Category c: The breast tissue is heterogeneously dense, which may obscure small masses. FINDINGS: No suspicious mass, malignant type microcalcifications or distortion detected in either breast. Mammographic images were processed with CAD. On physical exam, I do not palpate a discrete mass in the 2 o'clock region of the left breast 10 cm from the nipple. There is no  erythema of the breast. Targeted ultrasound is performed, showing normal tissue in the left breast at 2 o'clock 10 cm from the nipple. No solid or cystic mass, abnormal shadowing or distortion visualized. IMPRESSION: No evidence of malignancy in either breast. RECOMMENDATION: Bilateral screening mammogram in 1 year is recommended. I have discussed the findings and recommendations with the patient. Results were also provided in writing at the conclusion of the visit. If applicable, a reminder letter will be sent to the patient regarding the next appointment. BI-RADS CATEGORY  2: Benign. Electronically Signed   By: Lillia Mountain M.D.   On: 03/14/2017 15:43    Assessment & Plan:   Naevia was seen today for hypertension and copd.  Diagnoses and all orders for this visit:  Essential hypertension, benign - Her blood pressure is well controlled.  Will continue amlodipine and carvedilol at the current doses.  COPD mixed type ([place])- She has minimal symptom burden and does not  want to use an inhaler or nebulizer for symptom relief.  She will let me know if she develops any new or worsening symptoms.   I am having Barnetta Chapel A. Mostek maintain her latanoprost, LUTEIN PO, aspirin, GLUCOSAMINE HCL PO, vitamin C, calcium-vitamin D, acetaminophen, famotidine, pantoprazole, carvedilol, amLODipine, and Vitamin D (Ergocalciferol).  No orders of the defined types were placed in this encounter.    Follow-up: Return in about 6 months (around 04/11/2018).  Scarlette Calico, MD

## 2017-11-07 ENCOUNTER — Other Ambulatory Visit: Payer: Self-pay | Admitting: Internal Medicine

## 2017-11-07 DIAGNOSIS — I10 Essential (primary) hypertension: Secondary | ICD-10-CM

## 2017-11-13 ENCOUNTER — Other Ambulatory Visit: Payer: Self-pay | Admitting: Internal Medicine

## 2017-11-13 DIAGNOSIS — I10 Essential (primary) hypertension: Secondary | ICD-10-CM

## 2017-11-25 ENCOUNTER — Other Ambulatory Visit: Payer: Self-pay | Admitting: Internal Medicine

## 2018-01-13 ENCOUNTER — Other Ambulatory Visit: Payer: Self-pay | Admitting: Internal Medicine

## 2018-02-03 ENCOUNTER — Other Ambulatory Visit: Payer: Self-pay | Admitting: Internal Medicine

## 2018-02-03 DIAGNOSIS — Z1231 Encounter for screening mammogram for malignant neoplasm of breast: Secondary | ICD-10-CM

## 2018-02-21 ENCOUNTER — Other Ambulatory Visit: Payer: Self-pay | Admitting: Internal Medicine

## 2018-03-07 ENCOUNTER — Other Ambulatory Visit: Payer: Self-pay | Admitting: Internal Medicine

## 2018-03-07 DIAGNOSIS — I10 Essential (primary) hypertension: Secondary | ICD-10-CM

## 2018-03-12 ENCOUNTER — Other Ambulatory Visit: Payer: Self-pay | Admitting: Internal Medicine

## 2018-03-12 DIAGNOSIS — I10 Essential (primary) hypertension: Secondary | ICD-10-CM

## 2018-03-17 ENCOUNTER — Ambulatory Visit
Admission: RE | Admit: 2018-03-17 | Discharge: 2018-03-17 | Disposition: A | Discharge status: Home / Self Care | Payer: Medicare Other | Source: Ambulatory Visit | Attending: Internal Medicine | Admitting: Internal Medicine

## 2018-03-17 DIAGNOSIS — Z1231 Encounter for screening mammogram for malignant neoplasm of breast: Secondary | ICD-10-CM

## 2018-03-24 LAB — HM MAMMOGRAPHY

## 2018-05-18 ENCOUNTER — Other Ambulatory Visit: Payer: Self-pay | Admitting: Internal Medicine

## 2018-06-09 ENCOUNTER — Other Ambulatory Visit: Payer: Self-pay | Admitting: Internal Medicine

## 2018-06-09 DIAGNOSIS — I10 Essential (primary) hypertension: Secondary | ICD-10-CM

## 2018-08-13 ENCOUNTER — Other Ambulatory Visit: Payer: Self-pay | Admitting: Internal Medicine

## 2018-08-14 ENCOUNTER — Other Ambulatory Visit: Payer: Self-pay | Admitting: Family Medicine

## 2018-08-14 ENCOUNTER — Other Ambulatory Visit: Payer: Self-pay | Admitting: Internal Medicine

## 2018-08-15 ENCOUNTER — Telehealth: Payer: Self-pay | Admitting: Internal Medicine

## 2018-08-15 NOTE — Telephone Encounter (Signed)
Spoke to pt dtr and informed that an appt was needed. Stephanie Martinez stated understanding and will call back tomorrow.

## 2018-08-15 NOTE — Telephone Encounter (Signed)
OV please  In my experience the breast center usually wants a documentation of where the lump is before they will see the patient.

## 2018-08-15 NOTE — Telephone Encounter (Signed)
Do you want to see her before entering the referral? She was to be seen in October.

## 2018-08-15 NOTE — Telephone Encounter (Signed)
Copied from [place] [id number]. Topic: Referral - Request for Referral >> Aug 15, 2018 11:58 AM Carolyn Stare wrote:  Pt daughter call and said pt found a lump in her right breast and she is ca breast cancer survior. She will need a referral to the breast center since she already had her annual in Sept 2019 and is not due

## 2018-08-21 ENCOUNTER — Other Ambulatory Visit: Payer: Self-pay | Admitting: Internal Medicine

## 2018-08-21 DIAGNOSIS — I10 Essential (primary) hypertension: Secondary | ICD-10-CM

## 2018-08-23 ENCOUNTER — Other Ambulatory Visit: Payer: Self-pay

## 2018-08-23 DIAGNOSIS — I10 Essential (primary) hypertension: Secondary | ICD-10-CM

## 2018-08-23 MED ORDER — AMLODIPINE BESYLATE 10 MG PO TABS: 10.0000 mg | ORAL_TABLET | Freq: Every day | ORAL | 0 refills | Status: DC

## 2018-09-04 ENCOUNTER — Ambulatory Visit: Payer: Medicare Other | Admitting: Internal Medicine

## 2018-09-05 ENCOUNTER — Encounter: Payer: Self-pay | Admitting: Internal Medicine

## 2018-09-05 ENCOUNTER — Ambulatory Visit (INDEPENDENT_AMBULATORY_CARE_PROVIDER_SITE_OTHER): Payer: Medicare Other | Admitting: Internal Medicine

## 2018-09-05 ENCOUNTER — Other Ambulatory Visit: Payer: Self-pay

## 2018-09-05 ENCOUNTER — Other Ambulatory Visit (INDEPENDENT_AMBULATORY_CARE_PROVIDER_SITE_OTHER): Payer: Medicare Other

## 2018-09-05 VITALS — BP 164/84 | HR 74 | Temp 97.4°F | Resp 16 | Ht 62.0 in | Wt 126.5 lb

## 2018-09-05 DIAGNOSIS — Z0000 Encounter for general adult medical examination without abnormal findings: Secondary | ICD-10-CM | POA: Diagnosis not present

## 2018-09-05 DIAGNOSIS — A09 Infectious gastroenteritis and colitis, unspecified: Secondary | ICD-10-CM

## 2018-09-05 DIAGNOSIS — E559 Vitamin D deficiency, unspecified: Secondary | ICD-10-CM

## 2018-09-05 DIAGNOSIS — E7800 Pure hypercholesterolemia, unspecified: Secondary | ICD-10-CM

## 2018-09-05 DIAGNOSIS — Z23 Encounter for immunization: Secondary | ICD-10-CM

## 2018-09-05 DIAGNOSIS — N183 Chronic kidney disease, stage 3 unspecified: Secondary | ICD-10-CM

## 2018-09-05 DIAGNOSIS — I10 Essential (primary) hypertension: Secondary | ICD-10-CM | POA: Diagnosis not present

## 2018-09-05 LAB — LIPID PANEL
Cholesterol: 211 mg/dL — ABNORMAL HIGH (ref 0–200)
HDL: 42.5 mg/dL (ref >39.00)
NONHDL: 168.47
Total CHOL/HDL Ratio: 5
Triglycerides: 310 mg/dL — ABNORMAL HIGH (ref 0.0–149.0)
VLDL: 62 mg/dL — ABNORMAL HIGH (ref 0.0–40.0)

## 2018-09-05 LAB — COMPREHENSIVE METABOLIC PANEL
ALT: 8 U/L (ref 0–35)
AST: 14 U/L (ref 0–37)
Albumin: 4.5 g/dL (ref 3.5–5.2)
Alkaline Phosphatase: 90 U/L (ref 39–117)
BUN: 30 mg/dL — ABNORMAL HIGH (ref 6–23)
CO2: 22 mEq/L (ref 19–32)
Calcium: 10.2 mg/dL (ref 8.4–10.5)
Chloride: 101 mEq/L (ref 96–112)
Creatinine, Ser: 1.39 mg/dL — ABNORMAL HIGH (ref 0.40–1.20)
GFR: 35.25 mL/min — ABNORMAL LOW (ref >60.00)
GLUCOSE: 104 mg/dL — AB (ref 70–99)
Potassium: 4.2 mEq/L (ref 3.5–5.1)
Sodium: 135 mEq/L (ref 135–145)
Total Bilirubin: 0.4 mg/dL (ref 0.2–1.2)
Total Protein: 8 g/dL (ref 6.0–8.3)

## 2018-09-05 LAB — TSH: TSH: 2.43 u[IU]/mL (ref 0.35–4.50)

## 2018-09-05 LAB — VITAMIN D 25 HYDROXY (VIT D DEFICIENCY, FRACTURES): VITD: 101.26 ng/mL (ref 30.00–100.00)

## 2018-09-05 LAB — URINALYSIS, ROUTINE W REFLEX MICROSCOPIC
Bilirubin Urine: NEGATIVE
Hgb urine dipstick: NEGATIVE
Ketones, ur: NEGATIVE
Leukocytes,Ua: NEGATIVE
Nitrite: NEGATIVE
Specific Gravity, Urine: 1.01 (ref 1.000–1.030)
TOTAL PROTEIN, URINE-UPE24: 30 — AB
Urine Glucose: NEGATIVE
Urobilinogen, UA: 0.2 (ref 0.0–1.0)
pH: 6 (ref 5.0–8.0)

## 2018-09-05 LAB — CBC WITH DIFFERENTIAL/PLATELET
Basophils Absolute: 0.1 10*3/uL (ref 0.0–0.1)
Basophils Relative: 1 % (ref 0.0–3.0)
Eosinophils Absolute: 0.6 10*3/uL (ref 0.0–0.7)
Eosinophils Relative: 6.4 % — ABNORMAL HIGH (ref 0.0–5.0)
HEMATOCRIT: 39.8 % (ref 36.0–46.0)
Hemoglobin: 13.4 g/dL (ref 12.0–15.0)
LYMPHS PCT: 13.1 % (ref 12.0–46.0)
Lymphs Abs: 1.3 10*3/uL (ref 0.7–4.0)
MCHC: 33.8 g/dL (ref 30.0–36.0)
MCV: 93.3 fl (ref 78.0–100.0)
Monocytes Absolute: 0.6 10*3/uL (ref 0.1–1.0)
Monocytes Relative: 6.2 % (ref 3.0–12.0)
Neutro Abs: 7.2 10*3/uL (ref 1.4–7.7)
Neutrophils Relative %: 73.3 % (ref 43.0–77.0)
Platelets: 355 10*3/uL (ref 150.0–400.0)
RBC: 4.26 Mil/uL (ref 3.87–5.11)
RDW: 12.9 % (ref 11.5–15.5)
WBC: 9.8 10*3/uL (ref 4.0–10.5)

## 2018-09-05 LAB — SEDIMENTATION RATE: Sed Rate: 41 mm/hr — ABNORMAL HIGH (ref 0–30)

## 2018-09-05 LAB — LDL CHOLESTEROL, DIRECT: Direct LDL: 102 mg/dL

## 2018-09-05 NOTE — Progress Notes (Signed)
Subjective:  Patient ID: Stephanie Martinez, female    DOB: Oct 03, 1921  Age: 85 y.o. MRN: [id number]  CC: Annual Exam; Hypertension; and Hyperlipidemia   HPI Stephanie Martinez presents for a CPX.  She complains of a 92-monthhistory of intermittent, watery diarrhea.  She has about 2 loose bowel movements a day with stool urgency.  She denies abdominal pain, blood in the stool, cramping, weight loss, loss of appetite, or fever fever, or chills.  She complains of chronic nonproductive cough and generalized weakness.  Also complains of lower extremity swelling that is at times uncomfortable.  Past Medical History:  Diagnosis Date  . Arthritis   . [place]    [place]um[place] and [place]  . CKD (chronic kidney disease), stage III ([place]   . [place]    [place]  . GERD (gastroesophageal reflux disease)   . Glaucoma   . Hyperlipidemia   . Hypertension   . Meniere disease   . Osteoporosis   . Overactive thyroid gland    treated with liquid iodine  . Vertigo    doing PT    Past Surgical History:  Procedure Laterality Date  . APPENDECTOMY    . BREAST LUMPECTOMY Left   . BREAST SURGERY     lumpectomy-left  . ORIF HUMERUS FRACTURE Right 11/26/2014   Procedure: OPEN REDUCTION INTERNAL FIXATION (ORIF) HUMERAL SHAFT FRACTURE;  Surgeon: MAltamese Jardine MD;  [place]  Service: Orthopedics;  Laterality: Right;  . SMALL INTESTINE SURGERY     resection  . SPINE SURGERY     x2  . TONSILLECTOMY AND ADENOIDECTOMY  before age 85 . TOTAL HIP ARTHROPLASTY Bilateral    bilateral one 20 years ago, other 8-10 years ago    reports that she has quit smoking. Her smoking use included cigarettes. She quit after 35.00 years of use. She has never used smokeless tobacco. She reports current alcohol use of about 5.0 - 6.0 standard drinks of alcohol per week. She reports that she does not use drugs. family history includes BRCA 1/2 in her daughter and daughter; Breast cancer in  her paternal aunt; Breast cancer (age of onset: 513 in her sister; Breast cancer (age of onset: 771 in her sister; Colon cancer in her father; Colon cancer (age of onset: 445 in her sister; Hypertension in an other family member; Leukemia in an other family member; Lung cancer (age of onset: 669 in her sister; Stroke in her mother; Thyroid disease in her daughter. Allergies  Allergen Reactions  . Iodine Anaphylaxis  . Amlodipine Other (See Comments)    Leg edema    Outpatient Medications Prior to Visit  Medication Sig Dispense Refill  . acetaminophen (TYLENOL) 500 MG tablet Take 500 mg by mouth every 6 (six) hours as needed.    . calcium-vitamin D (OSCAL WITH D) 500-200 MG-UNIT tablet Take 1 tablet by mouth daily.    . carvedilol (COREG) 12.5 MG tablet TAKE 1 TABLET BY MOUTH TWICE DAILY 180 tablet 1  . famotidine (PEPCID) 20 MG tablet TAKE 1 TABLET BY MOUTH EVERY DAY AFTER SUPPER 90 tablet 1  . latanoprost (XALATAN) 0.005 % ophthalmic solution Place 1 drop into both eyes at bedtime.     . LUTEIN PO Take 25 mg by mouth daily.    . pantoprazole (PROTONIX) 40 MG tablet TAKE 1 TABLET BY MOUTH EVERY DAY 30 TO 60 MINUTES BEFORE FIRST MEAL OF THE DAY 90 tablet 1  . amLODipine (NORVASC) 10  MG tablet Take 1 tablet (10 mg total) by mouth daily. 15 tablet 0  . aspirin 81 MG tablet Take 81 mg by mouth daily.    Marland Kitchen GLUCOSAMINE HCL PO Take 1,000 mg by mouth daily.    . Vitamin D, Ergocalciferol, (DRISDOL) 50000 units CAPS capsule TAKE ONE CAPSULE BY MOUTH EVERY WEEK 12 capsule 1  . vitamin C (ASCORBIC ACID) 500 MG tablet Take 500 mg by mouth daily.     No facility-administered medications prior to visit.     ROS Review of Systems  Constitutional: Positive for fatigue. Negative for diaphoresis and unexpected weight change.  HENT: Negative.  Negative for trouble swallowing.   Eyes: Negative for visual disturbance.  Respiratory: Positive for cough. Negative for chest tightness, shortness of breath and  wheezing.   Cardiovascular: Positive for leg swelling. Negative for chest pain and palpitations.  Gastrointestinal: Positive for diarrhea. Negative for abdominal pain, blood in stool, constipation, nausea and vomiting.  Endocrine: Negative.   Genitourinary: Negative.  Negative for difficulty urinating and dysuria.  Musculoskeletal: Negative.  Negative for arthralgias and myalgias.  Skin: Negative.  Negative for pallor and rash.  Neurological: Positive for weakness. Negative for dizziness, syncope, light-headedness and headaches.  Hematological: Negative for adenopathy. Does not bruise/bleed easily.  Psychiatric/Behavioral: Negative.     Objective:  BP (!) 164/84 (BP Location: Left Arm, Patient Position: Sitting, Cuff Size: Normal)   Pulse 74   Temp (!) 97.4 F (36.3 C) (Oral)   Resp 16   Ht '5\' 2"'$  (1.575 m)   Wt 126 lb 8 oz (57.4 kg)   SpO2 97%   BMI 23.14 kg/m   BP Readings from Last 3 Encounters:  09/05/18 (!) 164/84  10/10/17 (!) 154/84  04/11/17 136/78    Wt Readings from Last 3 Encounters:  09/05/18 126 lb 8 oz (57.4 kg)  10/10/17 129 lb (58.5 kg)  04/11/17 128 lb (58.1 kg)    Physical Exam Vitals signs reviewed.  Constitutional:      General: She is not in acute distress.    Appearance: She is not ill-appearing, toxic-appearing or diaphoretic.  HENT:     Nose: Nose normal.     Mouth/Throat:     Mouth: Mucous membranes are moist.     Pharynx: Oropharynx is clear. No oropharyngeal exudate or posterior oropharyngeal erythema.  Eyes:     General: No scleral icterus.    Conjunctiva/sclera: Conjunctivae normal.  Neck:     Musculoskeletal: Normal range of motion and neck supple.  Cardiovascular:     Rate and Rhythm: Normal rate and regular rhythm.     Heart sounds: No murmur.  Pulmonary:     Effort: Pulmonary effort is normal.     Breath sounds: No stridor. No wheezing, rhonchi or rales.  Abdominal:     General: Abdomen is flat. Bowel sounds are normal.      Palpations: There is no hepatomegaly, splenomegaly or mass.     Tenderness: There is no abdominal tenderness. There is no guarding.  Musculoskeletal: Normal range of motion.        General: No swelling.     Right lower leg: 1+ Pitting Edema present.     Left lower leg: 1+ Pitting Edema present.  Skin:    General: Skin is warm and dry.     Coloration: Skin is not pale.  Neurological:     General: No focal deficit present.     Mental Status: She is alert and oriented to person,  place, and time. Mental status is at baseline.  Psychiatric:        Mood and Affect: Mood normal.        Behavior: Behavior normal.     Lab Results  Component Value Date   WBC 9.8 09/05/2018   HGB 13.4 09/05/2018   HCT 39.8 09/05/2018   PLT 355.0 09/05/2018   GLUCOSE 104 (H) 09/05/2018   CHOL 211 (H) 09/05/2018   TRIG 310.0 (H) 09/05/2018   HDL 42.50 09/05/2018   LDLDIRECT 102.0 09/05/2018   LDLCALC 102 (H) 11/26/2014   ALT 8 09/05/2018   AST 14 09/05/2018   NA 135 09/05/2018   K 4.2 09/05/2018   CL 101 09/05/2018   CREATININE 1.39 (H) 09/05/2018   BUN 30 (H) 09/05/2018   CO2 22 09/05/2018   TSH 2.43 09/05/2018   INR 1.11 11/25/2014   HGBA1C 5.7 09/18/2012    Mm 3d Screen Breast Bilateral  Result Date: 03/20/2018 CLINICAL DATA:  Screening. History of LEFT breast cancer in 2008 status post breast conservation surgery. History of benign LEFT breast biopsy in 2017. EXAM: DIGITAL SCREENING BILATERAL MAMMOGRAM WITH TOMO AND CAD COMPARISON:  Previous exam(s). ACR Breast Density Category c: The breast tissue is heterogeneously dense, which may obscure small masses. FINDINGS: There are postsurgical changes within the LEFT breast. There are no findings suspicious for malignancy within either breast. Images were processed with CAD. IMPRESSION: No mammographic evidence of malignancy. A result letter of this screening mammogram will be mailed directly to the patient. RECOMMENDATION: Screening mammogram in one  year. (Code:SM-B-01Y) BI-RADS CATEGORY  2: Benign. Electronically Signed   By: Franki Cabot M.D.   On: 03/20/2018 07:57    Assessment & Plan:   Falana was seen today for annual exam, hypertension and hyperlipidemia.  Diagnoses and all orders for this visit:  Need for influenza vaccination -     Flu vaccine HIGH DOSE PF (Fluzone High dose)  Essential hypertension, benign- She has symptomatic lower extremity edema so I have asked her to stop taking amlodipine.  She has significant renal insufficiency so I do not think it is prudent to add an ARB or a diuretic.  Will continue carvedilol at the current dose and at her advanced age will permit some degree of systolic hypertension to avoid complications. -     CBC with Differential/Platelet; Future -     Comprehensive metabolic panel; Future -     TSH; Future -     Urinalysis, Routine w reflex microscopic; Future  CKD (chronic kidney disease), stage III ([place])- Her renal function has declined slightly.  I have asked to avoid nephrotoxic agents. -     CBC with Differential/Platelet; Future -     Comprehensive metabolic panel; Future -     Urinalysis, Routine w reflex microscopic; Future  Pure hypercholesterolemia- Statin therapy is not indicated. -     Lipid panel; Future  Vitamin D deficiency- Her vitamin D level is a little too high so I have asked her to stop taking a vitamin D supplement. -     VITAMIN D 25 Hydroxy (Vit-D Deficiency, Fractures); Future  Diarrhea of infectious origin- Her white cell is count is very slightly elevated so I will screen her for C. difficile infection as well as parasites.  Will also check a stool elastase to see if there is concern for pancreatic insufficiency.  Her sed rate is not elevated considering her age so I do not think there is concern for inflammatory  bowel disease. -     CBC with Differential/Platelet; Future -     C. Difficile Toxin; Future -     Ova and parasite examination; Future -      Fecal lactoferrin, quant; Future -     Giardia/cryptosporidium (EIA); Future -     Pancreatic elastase, fecal; Future -     Sedimentation rate; Future  Routine general medical examination at a health care facility   I have discontinued Barnetta Chapel A. Bertagnolli's aspirin, GLUCOSAMINE HCL PO, vitamin C, Vitamin D (Ergocalciferol), and amLODipine. I am also having her maintain her latanoprost, LUTEIN PO, calcium-vitamin D, acetaminophen, famotidine, pantoprazole, and carvedilol.  No orders of the defined types were placed in this encounter.  See AVS for instructions about healthy living and anticipatory guidance.  Follow-up: Return in about 3 months (around 12/06/2018).  Scarlette Calico, MD

## 2018-09-05 NOTE — Patient Instructions (Signed)
Preventive Care 83 Years and Older, Female Preventive care refers to lifestyle choices and visits with your health care provider that can promote health and wellness. What does preventive care include?  A yearly physical exam. This is also called an annual well check.  Dental exams once or twice a year.  Routine eye exams. Ask your health care provider how often you should have your eyes checked.  Personal lifestyle choices, including: ? Daily care of your teeth and gums. ? Regular physical activity. ? Eating a healthy diet. ? Avoiding tobacco and drug use. ? Limiting alcohol use. ? Practicing safe sex. ? Taking low-dose aspirin every day. ? Taking vitamin and mineral supplements as recommended by your health care provider. What happens during an annual well check? The services and screenings done by your health care provider during your annual well check will depend on your age, overall health, lifestyle risk factors, and family history of disease. Counseling Your health care provider may ask you questions about your:  Alcohol use.  Tobacco use.  Drug use.  Emotional well-being.  Home and relationship well-being.  Sexual activity.  Eating habits.  History of falls.  Memory and ability to understand (cognition).  Work and work Statistician.  Reproductive health.  Screening You may have the following tests or measurements:  Height, weight, and BMI.  Blood pressure.  Lipid and cholesterol levels. These may be checked every 5 years, or more frequently if you are over 30 years old.  Skin check.  Lung cancer screening. You may have this screening every year starting at age 27 if you have a 30-pack-year history of smoking and currently smoke or have quit within the past 15 years.  Colorectal cancer screening. All adults should have this screening starting at age 33 and continuing until age 46. You will have tests every 1-10 years, depending on your results and the  type of screening test. People at increased risk should start screening at an earlier age. Screening tests may include: ? Guaiac-based fecal occult blood testing. ? Fecal immunochemical test (FIT). ? Stool DNA test. ? Virtual colonoscopy. ? Sigmoidoscopy. During this test, a flexible tube with a tiny camera (sigmoidoscope) is used to examine your rectum and lower colon. The sigmoidoscope is inserted through your anus into your rectum and lower colon. ? Colonoscopy. During this test, a long, thin, flexible tube with a tiny camera (colonoscope) is used to examine your entire colon and rectum.  Hepatitis C blood test.  Hepatitis B blood test.  Sexually transmitted disease (STD) testing.  Diabetes screening. This is done by checking your blood sugar (glucose) after you have not eaten for a while (fasting). You may have this done every 1-3 years.  Bone density scan. This is done to screen for osteoporosis. You may have this done starting at age 37.  Mammogram. This may be done every 1-2 years. Talk to your health care provider about how often you should have regular mammograms. Talk with your health care provider about your test results, treatment options, and if necessary, the need for more tests. Vaccines Your health care provider may recommend certain vaccines, such as:  Influenza vaccine. This is recommended every year.  Tetanus, diphtheria, and acellular pertussis (Tdap, Td) vaccine. You may need a Td booster every 10 years.  Varicella vaccine. You may need this if you have not been vaccinated.  Zoster vaccine. You may need this after age 38.  Measles, mumps, and rubella (MMR) vaccine. You may need at least  one dose of MMR if you were born in 1957 or later. You may also need a second dose.  Pneumococcal 13-valent conjugate (PCV13) vaccine. One dose is recommended after age 24.  Pneumococcal polysaccharide (PPSV23) vaccine. One dose is recommended after age 24.  Meningococcal  vaccine. You may need this if you have certain conditions.  Hepatitis A vaccine. You may need this if you have certain conditions or if you travel or work in places where you may be exposed to hepatitis A.  Hepatitis B vaccine. You may need this if you have certain conditions or if you travel or work in places where you may be exposed to hepatitis B.  Haemophilus influenzae type b (Hib) vaccine. You may need this if you have certain conditions. Talk to your health care provider about which screenings and vaccines you need and how often you need them. This information is not intended to replace advice given to you by your health care provider. Make sure you discuss any questions you have with your health care provider. Document Released: 07/04/2015 Document Revised: 07/28/2017 Document Reviewed: 04/08/2015 Elsevier Interactive Patient Education  2019 [employer].

## 2018-09-06 DIAGNOSIS — Z0000 Encounter for general adult medical examination without abnormal findings: Secondary | ICD-10-CM | POA: Insufficient documentation

## 2018-09-06 NOTE — Assessment & Plan Note (Signed)

## 2018-09-08 ENCOUNTER — Other Ambulatory Visit: Payer: Medicare Other

## 2018-09-08 DIAGNOSIS — A09 Infectious gastroenteritis and colitis, unspecified: Secondary | ICD-10-CM

## 2018-09-08 LAB — C.DIFFICILE TOXIN: C. Difficile Toxin A: NOT DETECTED

## 2018-09-10 ENCOUNTER — Emergency Department (HOSPITAL_COMMUNITY)
Admission: EM | Admit: 2018-09-10 | Discharge: 2018-09-10 | Disposition: A | Discharge status: Home / Self Care | Payer: Medicare Other | Attending: Emergency Medicine | Admitting: Emergency Medicine

## 2018-09-10 ENCOUNTER — Other Ambulatory Visit: Payer: Self-pay

## 2018-09-10 ENCOUNTER — Emergency Department (HOSPITAL_COMMUNITY): Payer: Medicare Other

## 2018-09-10 DIAGNOSIS — S42342A Displaced spiral fracture of shaft of humerus, left arm, initial encounter for closed fracture: Secondary | ICD-10-CM | POA: Insufficient documentation

## 2018-09-10 DIAGNOSIS — N183 Chronic kidney disease, stage 3 (moderate): Secondary | ICD-10-CM | POA: Diagnosis not present

## 2018-09-10 DIAGNOSIS — Y9301 Activity, walking, marching and hiking: Secondary | ICD-10-CM | POA: Insufficient documentation

## 2018-09-10 DIAGNOSIS — I129 Hypertensive chronic kidney disease with stage 1 through stage 4 chronic kidney disease, or unspecified chronic kidney disease: Secondary | ICD-10-CM | POA: Diagnosis not present

## 2018-09-10 DIAGNOSIS — Z79899 Other long term (current) drug therapy: Secondary | ICD-10-CM | POA: Diagnosis not present

## 2018-09-10 DIAGNOSIS — Y929 Unspecified place or not applicable: Secondary | ICD-10-CM | POA: Insufficient documentation

## 2018-09-10 DIAGNOSIS — R9389 Abnormal findings on diagnostic imaging of other specified body structures: Secondary | ICD-10-CM

## 2018-09-10 DIAGNOSIS — Y999 Unspecified external cause status: Secondary | ICD-10-CM | POA: Diagnosis not present

## 2018-09-10 DIAGNOSIS — W010XXA Fall on same level from slipping, tripping and stumbling without subsequent striking against object, initial encounter: Secondary | ICD-10-CM | POA: Insufficient documentation

## 2018-09-10 DIAGNOSIS — J449 Chronic obstructive pulmonary disease, unspecified: Secondary | ICD-10-CM | POA: Insufficient documentation

## 2018-09-10 DIAGNOSIS — W19XXXA Unspecified fall, initial encounter: Secondary | ICD-10-CM

## 2018-09-10 DIAGNOSIS — R930 Abnormal findings on diagnostic imaging of skull and head, not elsewhere classified: Secondary | ICD-10-CM | POA: Insufficient documentation

## 2018-09-10 DIAGNOSIS — S4992XA Unspecified injury of left shoulder and upper arm, initial encounter: Secondary | ICD-10-CM | POA: Diagnosis present

## 2018-09-10 DIAGNOSIS — Z87891 Personal history of nicotine dependence: Secondary | ICD-10-CM | POA: Insufficient documentation

## 2018-09-10 MED ORDER — HYDROCODONE-ACETAMINOPHEN 5-325 MG PO TABS: 0.5000 | ORAL_TABLET | ORAL | 0 refills | Status: DC | PRN

## 2018-09-10 MED ORDER — FENTANYL CITRATE (PF) 100 MCG/2ML IJ SOLN
50.0000 ug | Freq: Once | INTRAMUSCULAR | Status: AC
  Administered 2018-09-10: 50 ug via INTRAVENOUS
  Filled 2018-09-10: qty 2

## 2018-09-10 NOTE — ED Notes (Signed)
Ortho tech at bedside 

## 2018-09-10 NOTE — ED Provider Notes (Signed)
Medical screening examination/treatment/procedure(s) were conducted as a shared visit with non-physician practitioner(s) and myself.  I personally evaluated the patient during the encounter.  EKG Interpretation  Date/Time:  Sunday September 10 2018 12:47:51 EDT Ventricular Rate:  68 PR Interval:    QRS Duration: 97 QT Interval:  427 QTC Calculation: 455 R Axis:     Text Interpretation:  Sinus rhythm Abnormal R-wave progression, early transition Minimal ST elevation, inferior leads Baseline wander in lead(s) V3 No significant change since last tracing Confirmed by Fredia Sorrow [phone]) on 09/10/2018 1:08:06 PM   Results for orders placed or performed in visit on 09/08/18  C. Difficile Toxin  Result Value Ref Range   C. Difficile Toxin A Not Detected Not Detected   Dg Elbow Complete Left  Result Date: 09/10/2018 CLINICAL DATA:  Fall, LEFT upper extremity pain. EXAM: LEFT ELBOW - COMPLETE 3+ VIEW COMPARISON:  None. FINDINGS: Osteopenia limits characterization of osseous detail, however, there is no fracture line or displaced fracture fragment seen about the elbow. Overall osseous alignment appears normal. No appreciable joint effusion and adjacent soft tissues are unremarkable. IMPRESSION: Osteopenia. No acute findings. No osseous fracture or dislocation seen at the LEFT elbow. Electronically Signed   By: Franki Cabot M.D.   On: 09/10/2018 14:03   Dg Wrist Complete Left  Result Date: 09/10/2018 CLINICAL DATA:  Fall, LEFT upper extremity pain. EXAM: LEFT WRIST - COMPLETE 3+ VIEW COMPARISON:  None. FINDINGS: No osseous fracture or dislocation seen. 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 Associated radial subluxation of the first metacarpal bone. Extensive calcifications at the level of the TFCC indicating underlying CPPD. IMPRESSION:  1. No acute findings. No osseous fracture or dislocation. 2. Degenerative changes, as detailed above. Electronically Signed   By: Franki Cabot M.D.   On: 09/10/2018 14:04   Ct Head Wo Contrast  Result Date: 09/10/2018 CLINICAL DATA:  Fall neck pain EXAM: CT HEAD WITHOUT CONTRAST CT CERVICAL SPINE WITHOUT CONTRAST TECHNIQUE: Multidetector CT imaging of the head and cervical spine was performed following the standard protocol without intravenous contrast. Multiplanar CT image reconstructions of the cervical spine were also generated. COMPARISON:  CT head 11/25/2014 FINDINGS: CT HEAD FINDINGS Brain: Generalized atrophy with chronic microvascular ischemic changes in the white matter. Negative for acute infarct, hemorrhage, or mass. Vascular: Vascular calcification.  Negative for hyperdense vessel Skull: Negative for skull fracture Sinuses/Orbits: Paranasal sinuses clear.  Bilateral ocular surgery Other: None CT CERVICAL SPINE FINDINGS Alignment: Mild anterolisthesis C3-4.  Mild retrolisthesis C5-6. Skull base and vertebrae: Negative for fracture. 8 mm sclerotic lesion at C3 vertebral body on the left compatible with bone island unchanged from 2016 Soft tissues and spinal canal: Negative for mass or adenopathy Disc levels: Disc degeneration and spondylosis. Diffuse facet degeneration. Multilevel foraminal stenosis bilaterally due to spurring Upper chest: 11 mm ill-defined nodular density right upper lobe adjacent to the fissure. No prior studies available for comparison. Other: None IMPRESSION: 1. No acute intracranial abnormality. Atrophy and chronic microvascular ischemia. 2. Negative for cervical spine fracture 3. 11 mm nodular density right upper lobe. No prior studies available. This could represent pneumonia scarring, or neoplasm. CT chest recommended on an elective basis. Electronically Signed   By: Franchot Gallo M.D.   On: 09/10/2018 13:45   Ct Cervical Spine Wo Contrast  Result Date:  09/10/2018 CLINICAL DATA:  Fall neck pain EXAM: CT HEAD  WITHOUT CONTRAST CT CERVICAL SPINE WITHOUT CONTRAST TECHNIQUE: Multidetector CT imaging of the head and cervical spine was performed following the standard protocol without intravenous contrast. Multiplanar CT image reconstructions of the cervical spine were also generated. COMPARISON:  CT head 11/25/2014 FINDINGS: CT HEAD FINDINGS Brain: Generalized atrophy with chronic microvascular ischemic changes in the white matter. Negative for acute infarct, hemorrhage, or mass. Vascular: Vascular calcification.  Negative for hyperdense vessel Skull: Negative for skull fracture Sinuses/Orbits: Paranasal sinuses clear.  Bilateral ocular surgery Other: None CT CERVICAL SPINE FINDINGS Alignment: Mild anterolisthesis C3-4.  Mild retrolisthesis C5-6. Skull base and vertebrae: Negative for fracture. 8 mm sclerotic lesion at C3 vertebral body on the left compatible with bone island unchanged from 2016 Soft tissues and spinal canal: Negative for mass or adenopathy Disc levels: Disc degeneration and spondylosis. Diffuse facet degeneration. Multilevel foraminal stenosis bilaterally due to spurring Upper chest: 11 mm ill-defined nodular density right upper lobe adjacent to the fissure. No prior studies available for comparison. Other: None IMPRESSION: 1. No acute intracranial abnormality. Atrophy and chronic microvascular ischemia. 2. Negative for cervical spine fracture 3. 11 mm nodular density right upper lobe. No prior studies available. This could represent pneumonia scarring, or neoplasm. CT chest recommended on an elective basis. Electronically Signed   By: Franchot Gallo M.D.   On: 09/10/2018 13:45   Dg Shoulder Left  Result Date: 09/10/2018 CLINICAL DATA:  Fall, LEFT upper extremity pain. EXAM: LEFT SHOULDER - 2+ VIEW COMPARISON:  None. FINDINGS: Significantly displaced fracture within the proximal LEFT humerus, centered at the proximal diaphysis, with approximately 2  cm cortical displacement and slight overriding of the fracture fragments. Humeral head remains well positioned relative to the glenoid fossa. Soft tissues about the LEFT shoulder are unremarkable. IMPRESSION: Significantly displaced fracture within the proximal diaphysis of the LEFT humerus. Electronically Signed   By: Franki Cabot M.D.   On: 09/10/2018 14:02   Dg Hips Bilat W Or Wo Pelvis 3-4 Views  Result Date: 09/10/2018 CLINICAL DATA:  Fall today. History of bilateral hip surgeries. EXAM: DG HIP (WITH OR WITHOUT PELVIS) 3-4V BILAT COMPARISON:  None. FINDINGS: Bilateral hip prostheses in place. Hardware appears intact and appropriately positioned. No evidence of hardware loosening. Surrounding osseous structures appear intact and normally aligned. No osseous fracture line or displacement seen. Soft tissues about the pelvis and bilateral hips are unremarkable. IMPRESSION: No acute findings. Bilateral hip prostheses appears intact and appropriately positioned. No osseous fracture or dislocation seen. Electronically Signed   By: Franki Cabot M.D.   On: 09/10/2018 14:06     Patient seen by me along with physician assistant.  Significant findings are humerus fracture on the left side.  With some displacement.  Discussed with orthopedics.  Recommending coaptation splint.  They will see her in the office on Tuesday.  Patient very hard of hearing but is alert no loss of consciousness.  Did hit her left side shoulder arm side of her face no loss of consciousness as stated above.  Patient not on blood thinners.  Left arm with some tenderness in and deformity at the midshaft area of the humerus.  Radial pulse distally is 2+.  Good movement of the fingers.   Fredia Sorrow, MD 09/10/18 1447

## 2018-09-10 NOTE — ED Provider Notes (Signed)
[facility] DEPT Provider Note   CSN: [id number] Arrival date & time: 09/10/18  1232    History   Chief Complaint Chief Complaint  Patient presents with  . Arm Pain  . Arm Injury    HPI Stephanie Martinez is a 85 y.o. female presenting for evaluation after fall.  Level 5 caveat, patient is very hard of hearing.  Patient states just prior to arrival she tripped and fell, landing on her left side.  She did hit the left side of her head, but denies loss of consciousness.  She is not on blood thinners.  She reports pain of her left shoulder.  She denies pain elsewhere.  Denies headache, vision changes, neck pain, back pain, nausea, vomiting, dental pain, numbness, or tingling.  She has not taken anything for pain including Tylenol or ibuprofen.  She was given fentanyl and Zofran with EMS, which improved her pain significantly.  Patient states prior to the fall, she did not have any dizziness, chest pain, shortness of breath, or lightheadedness.  Patient lives at home with her daughter.   Additional history obtained from chart review.  Patient with a history of hypertension, Mnire's disease, GERD, bilateral hip replacements, breast cancer status post lumpectomy and radiation, colon cancer s/p resection, COPD, CKD.      HPI  Past Medical History:  Diagnosis Date  . Arthritis   . Breast cancer ([place])    lumpectomy and radiation  . CKD (chronic kidney disease), stage III ([place])   . Colon cancer ([place])    resection  . GERD (gastroesophageal reflux disease)   . Glaucoma   . Hyperlipidemia   . Hypertension   . Meniere disease   . Osteoporosis   . Overactive thyroid gland    treated with liquid iodine  . Vertigo    doing PT     Patient Active Problem List   Diagnosis Date Noted  . Routine general medical examination at a health care facility 09/06/2018  . Diarrhea of infectious origin 09/05/2018  . Seizures ([place]) 07/22/2015  . Upper airway  cough syndrome 05/23/2015  . Vitamin D deficiency 12/05/2014  . CKD (chronic kidney disease), stage III ([place])   . Allergic rhinitis 01/22/2013  . COPD mixed type ([place]) 01/05/2013  . Other abnormal glucose 09/18/2012  . Spondylosis of lumbar region without myelopathy or radiculopathy 07/27/2012  . Low back pain 04/26/2012  . Meniere disease   . GERD (gastroesophageal reflux disease)   . Osteoarthritis of hip   . Breast cancer ([place])   . Eczema 12/29/2011  . Essential hypertension, benign 08/27/2011  . Other screening mammogram 08/27/2011  . Pure hypercholesterolemia 08/27/2011  . Glaucoma 08/27/2011    Past Surgical History:  Procedure Laterality Date  . APPENDECTOMY    . BREAST LUMPECTOMY Left   . BREAST SURGERY     lumpectomy-left  . ORIF HUMERUS FRACTURE Right 11/26/2014   Procedure: OPEN REDUCTION INTERNAL FIXATION (ORIF) HUMERAL SHAFT FRACTURE;  Surgeon: Altamese Morgan, MD;  Location: [facility];  Service: Orthopedics;  Laterality: Right;  . SMALL INTESTINE SURGERY     resection  . SPINE SURGERY     x2  . TONSILLECTOMY AND ADENOIDECTOMY  before age 30  . TOTAL HIP ARTHROPLASTY Bilateral    bilateral one 20 years ago, other 8-10 years ago     OB History    Gravida  2   Para  2   Term      Preterm  AB      Living  2     SAB      TAB      Ectopic      Multiple      Live Births           Obstetric Comments  ? If had MAB         Home Medications    Prior to Admission medications   Medication Sig Start Date End Date Taking? Authorizing Provider  acetaminophen (TYLENOL) 500 MG tablet Take 500 mg by mouth every 6 (six) hours as needed.    [provider]  calcium-vitamin D (OSCAL WITH D) 500-200 MG-UNIT tablet Take 1 tablet by mouth daily.    [provider]  carvedilol (COREG) 12.5 MG tablet TAKE 1 TABLET BY MOUTH TWICE DAILY 08/21/18   Janith Lima, MD  famotidine (PEPCID) 20 MG tablet TAKE 1 TABLET BY MOUTH EVERY DAY AFTER SUPPER  03/15/16   Tanda Rockers, MD  latanoprost (XALATAN) 0.005 % ophthalmic solution Place 1 drop into both eyes at bedtime.     [provider]  LUTEIN PO Take 25 mg by mouth daily.    [provider]  pantoprazole (PROTONIX) 40 MG tablet TAKE 1 TABLET BY MOUTH EVERY DAY 30 TO 60 MINUTES BEFORE FIRST MEAL OF THE DAY 08/13/18   Janith Lima, MD    Family History Family History  Problem Relation Age of Onset  . Stroke Mother   . Colon cancer Father        dx in 68s  . Lung cancer Sister 49  . BRCA 1/2 Daughter        BRCA2 positive  . BRCA 1/2 Daughter        BRCA2 positive  . Breast cancer Sister 18       bilateral breast cancer, 2nd dx at 18  . Colon cancer Sister 44  . Breast cancer Sister 49       bilateral breast cancer; 2nd dx age 84  . Leukemia Other        dx in his 52s, youngest sister's son  . Hypertension Other        materal side  . Breast cancer Paternal Aunt        <50  . Thyroid disease Daughter   . Heart disease Neg Hx   . Hyperlipidemia Neg Hx     Social History Social History   Tobacco Use  . Smoking status: Former Smoker    Years: 35.00    Types: Cigarettes  . Smokeless tobacco: Never Used  . Tobacco comment: in her early 37s  Substance Use Topics  . Alcohol use: Yes    Alcohol/week: 5.0 - 6.0 standard drinks    Types: 5 - 6 Standard drinks or equivalent per week    Comment: Wine  . Drug use: No     Allergies   Iodine and Amlodipine   Review of Systems Review of Systems  Musculoskeletal: Positive for arthralgias.  Neurological: Negative for numbness.  Hematological: Does not bruise/bleed easily.  All other systems reviewed and are negative.    Physical Exam Updated Vital Signs BP (!) 157/69   Pulse 71   Temp 97.8 F (36.6 C) (Oral)   Resp 20   SpO2 98%   Physical Exam Vitals signs and nursing note reviewed.  Constitutional:      General: She is not in acute distress.    Appearance: She is well-developed.  Comments: Elderly female laying in the bed in no acute distress  HENT:     Head: Normocephalic.      Comments: Small injury on the left temple.  No injury noted elsewhere in the head of the skull.  No hemotympanum or nasal septal hematoma. Eyes:     Extraocular Movements: Extraocular movements intact.     Conjunctiva/sclera: Conjunctivae normal.     Pupils: Pupils are equal, round, and reactive to light.     Comments: EOMI and PERRLA.  Neck:     Musculoskeletal: Normal range of motion and neck supple.     Comments: No tenderness palpation midline C-spine.  No step-offs. Cardiovascular:     Rate and Rhythm: Normal rate and regular rhythm.     Pulses: Normal pulses.  Pulmonary:     Effort: Pulmonary effort is normal. No respiratory distress.     Breath sounds: Normal breath sounds. No wheezing.  Abdominal:     General: There is no distension.     Palpations: Abdomen is soft. There is no mass.     Tenderness: There is no abdominal tenderness. There is no guarding or rebound.     Comments: No tenderness palpation of the abdomen.  Musculoskeletal:        General: Tenderness and deformity present.     Comments: Deformity of the left shoulder/arm.  Radial pulses intact.  Good cap refill.  Sensation of distal upper extremities intact bilaterally.  Patient able to make a fist bilaterally.  Tenderness palpation of the left shoulder and humerus. No obvious deformity noted on any other extremity.  No tenderness palpation of the pelvis.  Skin:    General: Skin is warm and dry.     Capillary Refill: Capillary refill takes less than 2 seconds.  Neurological:     Mental Status: She is alert and oriented to person, place, and time.     GCS: GCS eye subscore is 4. GCS verbal subscore is 5. GCS motor subscore is 6.     Sensory: Sensation is intact.     Motor: Motor function is intact.      ED Treatments / Results  Labs (all labs ordered are listed, but only abnormal results are displayed)  Labs Reviewed - No data to display  EKG EKG Interpretation  Date/Time:  Sunday September 10 2018 12:47:51 EDT Ventricular Rate:  68 PR Interval:    QRS Duration: 97 QT Interval:  427 QTC Calculation: 455 R Axis:     Text Interpretation:  Sinus rhythm Abnormal R-wave progression, early transition Minimal ST elevation, inferior leads Baseline wander in lead(s) V3 No significant change since last tracing Confirmed by Fredia Sorrow [id number]) on 09/10/2018 1:08:06 PM   Radiology Dg Elbow Complete Left  Result Date: 09/10/2018 CLINICAL DATA:  Fall, LEFT upper extremity pain. EXAM: LEFT ELBOW - COMPLETE 3+ VIEW COMPARISON:  None. FINDINGS: Osteopenia limits characterization of osseous detail, however, there is no fracture line or displaced fracture fragment seen about the elbow. Overall osseous alignment appears normal. No appreciable joint effusion and adjacent soft tissues are unremarkable. IMPRESSION: Osteopenia. No acute findings. No osseous fracture or dislocation seen at the LEFT elbow. Electronically Signed   By: Franki Cabot M.D.   On: 09/10/2018 14:03   Dg Wrist Complete Left  Result Date: 09/10/2018 CLINICAL DATA:  Fall, LEFT upper extremity pain. EXAM: LEFT WRIST - COMPLETE 3+ VIEW COMPARISON:  None. FINDINGS: No osseous fracture or dislocation seen. Advanced degenerative osteoarthritis at the first [facility] joint  and at the underlying scaphotrapezium joint space, with associated joint space narrowings, articular surface sclerosis and osteophyte formation. Associated radial subluxation of the first metacarpal bone. Extensive calcifications at the level of the TFCC indicating underlying CPPD. IMPRESSION: 1. No acute findings. No osseous fracture or dislocation. 2. Degenerative changes, as detailed above. Electronically Signed   By: Franki Cabot M.D.   On: 09/10/2018 14:04   Ct Head Wo Contrast  Result Date: 09/10/2018 CLINICAL DATA:  Fall neck pain EXAM: CT HEAD WITHOUT CONTRAST CT CERVICAL  SPINE WITHOUT CONTRAST TECHNIQUE: Multidetector CT imaging of the head and cervical spine was performed following the standard protocol without intravenous contrast. Multiplanar CT image reconstructions of the cervical spine were also generated. COMPARISON:  CT head 11/25/2014 FINDINGS: CT HEAD FINDINGS Brain: Generalized atrophy with chronic microvascular ischemic changes in the white matter. Negative for acute infarct, hemorrhage, or mass. Vascular: Vascular calcification.  Negative for hyperdense vessel Skull: Negative for skull fracture Sinuses/Orbits: Paranasal sinuses clear.  Bilateral ocular surgery Other: None CT CERVICAL SPINE FINDINGS Alignment: Mild anterolisthesis C3-4.  Mild retrolisthesis C5-6. Skull base and vertebrae: Negative for fracture. 8 mm sclerotic lesion at C3 vertebral body on the left compatible with bone island unchanged from 2016 Soft tissues and spinal canal: Negative for mass or adenopathy Disc levels: Disc degeneration and spondylosis. Diffuse facet degeneration. Multilevel foraminal stenosis bilaterally due to spurring Upper chest: 11 mm ill-defined nodular density right upper lobe adjacent to the fissure. No prior studies available for comparison. Other: None IMPRESSION: 1. No acute intracranial abnormality. Atrophy and chronic microvascular ischemia. 2. Negative for cervical spine fracture 3. 11 mm nodular density right upper lobe. No prior studies available. This could represent pneumonia scarring, or neoplasm. CT chest recommended on an elective basis. Electronically Signed   By: Franchot Gallo M.D.   On: 09/10/2018 13:45   Ct Cervical Spine Wo Contrast  Result Date: 09/10/2018 CLINICAL DATA:  Fall neck pain EXAM: CT HEAD WITHOUT CONTRAST CT CERVICAL SPINE WITHOUT CONTRAST TECHNIQUE: Multidetector CT imaging of the head and cervical spine was performed following the standard protocol without intravenous contrast. Multiplanar CT image reconstructions of the cervical spine were  also generated. COMPARISON:  CT head 11/25/2014 FINDINGS: CT HEAD FINDINGS Brain: Generalized atrophy with chronic microvascular ischemic changes in the white matter. Negative for acute infarct, hemorrhage, or mass. Vascular: Vascular calcification.  Negative for hyperdense vessel Skull: Negative for skull fracture Sinuses/Orbits: Paranasal sinuses clear.  Bilateral ocular surgery Other: None CT CERVICAL SPINE FINDINGS Alignment: Mild anterolisthesis C3-4.  Mild retrolisthesis C5-6. Skull base and vertebrae: Negative for fracture. 8 mm sclerotic lesion at C3 vertebral body on the left compatible with bone island unchanged from 2016 Soft tissues and spinal canal: Negative for mass or adenopathy Disc levels: Disc degeneration and spondylosis. Diffuse facet degeneration. Multilevel foraminal stenosis bilaterally due to spurring Upper chest: 11 mm ill-defined nodular density right upper lobe adjacent to the fissure. No prior studies available for comparison. Other: None IMPRESSION: 1. No acute intracranial abnormality. Atrophy and chronic microvascular ischemia. 2. Negative for cervical spine fracture 3. 11 mm nodular density right upper lobe. No prior studies available. This could represent pneumonia scarring, or neoplasm. CT chest recommended on an elective basis. Electronically Signed   By: Franchot Gallo M.D.   On: 09/10/2018 13:45   Dg Shoulder Left  Result Date: 09/10/2018 CLINICAL DATA:  Fall, LEFT upper extremity pain. EXAM: LEFT SHOULDER - 2+ VIEW COMPARISON:  None. FINDINGS: Significantly displaced fracture within the  proximal LEFT humerus, centered at the proximal diaphysis, with approximately 2 cm cortical displacement and slight overriding of the fracture fragments. Humeral head remains well positioned relative to the glenoid fossa. Soft tissues about the LEFT shoulder are unremarkable. IMPRESSION: Significantly displaced fracture within the proximal diaphysis of the LEFT humerus. Electronically Signed    By: Franki Cabot M.D.   On: 09/10/2018 14:02   Dg Hips Bilat W Or Wo Pelvis 3-4 Views  Result Date: 09/10/2018 CLINICAL DATA:  Fall today. History of bilateral hip surgeries. EXAM: DG HIP (WITH OR WITHOUT PELVIS) 3-4V BILAT COMPARISON:  None. FINDINGS: Bilateral hip prostheses in place. Hardware appears intact and appropriately positioned. No evidence of hardware loosening. Surrounding osseous structures appear intact and normally aligned. No osseous fracture line or displacement seen. Soft tissues about the pelvis and bilateral hips are unremarkable. IMPRESSION: No acute findings. Bilateral hip prostheses appears intact and appropriately positioned. No osseous fracture or dislocation seen. Electronically Signed   By: Franki Cabot M.D.   On: 09/10/2018 14:06    Procedures Procedures (including critical care time)  Medications Ordered in ED Medications - No data to display   Initial Impression / Assessment and Plan / ED Course  I have reviewed the triage vital signs and the nursing notes.  Pertinent labs & imaging results that were available during my care of the patient were reviewed by me and considered in my medical decision making (see chart for details).        Patient presenting for evaluation after a fall.  Physical exam reassuring, patient is neurovascularly intact.  However, has obvious deformity of the left shoulder/upper arm.  As such, will obtain imaging of the upper extremity.  Due to patient's age, evidence of head trauma, and possibly distracting injury, will obtain CT head and neck for further evaluation.  Pelvis x-ray also ordered considering nature of fall.  As follows clearly mechanical, I do not believe she needs further labs for work-up.  EKG obtained, unchanged from previous.  CT head and neck negative for acute trauma. Does show abnormality of the R upper lobe of the chest, can obtain further evaluation in an OP setting.  xrays viewed and interpreted by me, left  shoulder x-ray shows displaced spiral fracture of the left humerus.  No other acute fracture dislocation found on remaining x-rays.  Will consult with orthopedics for further evaluation. Discussed with attending, Dr. Rogene Houston evaluated the pt.   Discussed with Dr. Griffin Basil from orthopedics, who recommends coaptation splint and follow-up in the clinic on Tuesday.  Findings and plan discussed with patient and daughter. PMP checked, pt without concerning controlled substance rx. At this time, pt appears safe fpr d/c. Return precautions given. Pt and daughter state they understand and agree to plan.    Final Clinical Impressions(s) / ED Diagnoses   Final diagnoses:  Closed displaced spiral fracture of shaft of left humerus, initial encounter  Fall, initial encounter  Abnormal chest CT    ED Discharge Orders    None       Franchot Heidelberg, PA-C 09/10/18 1539    Fredia Sorrow, MD 09/13/18 2043

## 2018-09-10 NOTE — ED Notes (Signed)
Bed: MM76 Expected date:  Expected time:  Means of arrival:  Comments: 85 yo arm deformity

## 2018-09-10 NOTE — ED Triage Notes (Signed)
Pt was witnessed falling at home. Hit left side face, shoulder, arm. No loss of consciousness. Deformity to left upper arm and shoulder  Pt stated "arm feels like floating away and falling off"  Pain 10 out of 10 originally, now 0 out of 10 after 150 fentanyl 4 mg zofran   22 Rt forearm   156/64     HR 70    85% on RA 94% on 4L O2   Daughter Amarrah Meinhart (instructed to stay away until called) [phone]

## 2018-09-10 NOTE — Discharge Instructions (Addendum)
Keep the splint on until evaluated by orthopedics. Use ice to help with pain and swelling. Use Tylenol and ibuprofen as needed for mild to moderate pain. Use 1/2 norco as needed for severe or breakthrough pain. Have caution, this is a narcotic pain medication that may increase confusion and causes increased risk for fall.  Follow-up with your primary care doctor for further evaluation of the abnormal finding on the chest.  Follow-up with orthopedics on Tuesday in the clinic.  You should call on Monday to make an appointment. Return to the emergency room with any new, worsening, concerning symptoms.

## 2018-09-10 NOTE — ED Notes (Signed)
Spoke with ortho to come place splint on pt.

## 2018-09-11 ENCOUNTER — Telehealth: Payer: Self-pay

## 2018-09-11 NOTE — Telephone Encounter (Signed)
Pt is on TCM list after ED visit for humeral fracture (left side).

## 2018-09-19 LAB — GIARDIA/CRYPTOSPORIDIUM (EIA)
MICRO NUMBER:: 340912
MICRO NUMBER:: 340913
RESULT:: NOT DETECTED
RESULT:: NOT DETECTED
SPECIMEN QUALITY:: ADEQUATE
SPECIMEN QUALITY:: ADEQUATE

## 2018-09-19 LAB — OVA AND PARASITE EXAMINATION
CONCENTRATE RESULT:: NONE SEEN
MICRO NUMBER:: 340914
SPECIMEN QUALITY:: ADEQUATE
TRICHROME RESULT:: NONE SEEN

## 2018-09-19 LAB — FECAL LACTOFERRIN, QUANT
FECAL LACTOFERRIN: NEGATIVE
MICRO NUMBER:: 340911
SPECIMEN QUALITY:: ADEQUATE

## 2018-09-19 LAB — PANCREATIC ELASTASE, FECAL: Pancreatic Elastase-1, Stool: 452 mcg/g

## 2018-09-20 ENCOUNTER — Other Ambulatory Visit: Payer: Self-pay | Admitting: Internal Medicine

## 2018-09-20 ENCOUNTER — Ambulatory Visit: Payer: Self-pay

## 2018-09-20 DIAGNOSIS — K591 Functional diarrhea: Secondary | ICD-10-CM

## 2018-09-20 DIAGNOSIS — K589 Irritable bowel syndrome without diarrhea: Secondary | ICD-10-CM | POA: Insufficient documentation

## 2018-09-20 MED ORDER — DIPHENOXYLATE-ATROPINE 2.5-0.025 MG PO TABS: 1.0000 | ORAL_TABLET | Freq: Two times a day (BID) | ORAL | 3 refills | Status: DC | PRN

## 2018-09-20 NOTE — Telephone Encounter (Signed)
Spoke with Izora Gala (pt's daughter) who reports over a month of diarrhea. Pt has started lomotil today. Pt has mild diarrhea now loose with some chunks of formed stool. Pt is weak and has little appetite.Pt experiencing some cramping before BM's only. Cramping stops after BM. Per daughter she has excellent urine output and mouth is not dry. Daughter is concerned about the weakness. No vomiting , no blood in stools, no fever. Pt has tried Kaopectate with slight improvement in symptoms.  Pt was prescribed Lomotil today. Gave daughter care advice and verified her e-mail. Discussed with daughter how to get on Mychart.  Care advice given and daughter verbalized understanding.  Reason for Disposition . [1] MILD diarrhea (e.g., 1-3 or more stools than normal in past 24 hours) without known cause AND [2] present >  7 days  Answer Assessment - Initial Assessment Questions 1. DIARRHEA SEVERITY: "How bad is the diarrhea?" "How many extra stools have you had in the past 24 hours than normal?"    - NO DIARRHEA (SCALE 0)   - MILD (SCALE 1-3): Few loose or mushy BMs; increase of 1-3 stools over normal daily number of stools; mild increase in ostomy output.   -  MODERATE (SCALE 4-7): Increase of 4-6 stools daily over normal; moderate increase in ostomy output. * SEVERE (SCALE 8-10; OR 'WORST POSSIBLE'): Increase of 7 or more stools daily over normal; moderate increase in ostomy output; incontinence.     mild 2. ONSET: "When did the diarrhea begin?"      More than a month 3. BM CONSISTENCY: "How loose or watery is the diarrhea?"     Varies loose now with occasional formed pieces of stool 4. VOMITING: "Are you also vomiting?" If so, ask: "How many times in the past 24 hours?"      No- poor appetite- 5. ABDOMINAL PAIN: "Are you having any abdominal pain?" If yes: "What does it feel like?" (e.g., crampy, dull, intermittent, constant)      Cramping before BM and then goes away after she has had a BM 6. ABDOMINAL PAIN  SEVERITY: If present, ask: "How bad is the pain?"  (e.g., Scale 1-10; mild, moderate, or severe)   - MILD (1-3): doesn't interfere with normal activities, abdomen soft and not tender to touch    - MODERATE (4-7): interferes with normal activities or awakens from sleep, tender to touch    - SEVERE (8-10): excruciating pain, doubled over, unable to do any normal activities       mild 7. ORAL INTAKE: If vomiting, "Have you been able to drink liquids?" "How much fluids have you had in the past 24 hours?"     Not vomiting- tolerating fluids and protein drink  8. HYDRATION: "Any signs of dehydration?" (e.g., dry mouth [not just dry lips], too weak to stand, dizziness, new weight loss) "When did you last urinate?"     Weakness 9. EXPOSURE: "Have you traveled to a foreign country recently?" "Have you been exposed to anyone with diarrhea?" "Could you have eaten any food that was spoiled?"     No-no-no 10. ANTIBIOTIC USE: "Are you taking antibiotics now or have you taken antibiotics in the past 2 months?"       no 11. OTHER SYMPTOMS: "Do you have any other symptoms?" (e.g., fever, blood in stool)       no 12. PREGNANCY: "Is there any chance you are pregnant?" "When was your last menstrual period?"       n/a  Protocols used:  DIARRHEA-A-AH

## 2018-09-21 ENCOUNTER — Encounter: Payer: Self-pay | Admitting: Internal Medicine

## 2018-11-29 ENCOUNTER — Other Ambulatory Visit: Payer: Self-pay | Admitting: Orthopedic Surgery

## 2018-11-29 DIAGNOSIS — S42202A Unspecified fracture of upper end of left humerus, initial encounter for closed fracture: Secondary | ICD-10-CM

## 2018-12-06 ENCOUNTER — Ambulatory Visit
Admission: RE | Admit: 2018-12-06 | Discharge: 2018-12-06 | Disposition: A | Discharge status: Home / Self Care | Payer: Medicare Other | Source: Ambulatory Visit | Attending: Orthopedic Surgery | Admitting: Orthopedic Surgery

## 2018-12-06 DIAGNOSIS — S42202A Unspecified fracture of upper end of left humerus, initial encounter for closed fracture: Secondary | ICD-10-CM

## 2019-01-10 ENCOUNTER — Other Ambulatory Visit: Payer: Self-pay | Admitting: Internal Medicine

## 2019-01-24 ENCOUNTER — Other Ambulatory Visit: Payer: Self-pay | Admitting: Internal Medicine

## 2019-01-29 ENCOUNTER — Other Ambulatory Visit: Payer: Self-pay | Admitting: Internal Medicine

## 2019-01-29 DIAGNOSIS — I10 Essential (primary) hypertension: Secondary | ICD-10-CM

## 2019-01-30 ENCOUNTER — Other Ambulatory Visit (HOSPITAL_COMMUNITY)
Admission: RE | Admit: 2019-01-30 | Discharge: 2019-01-30 | Disposition: A | Discharge status: Home / Self Care | Payer: Medicare Other | Source: Ambulatory Visit | Attending: Orthopedic Surgery | Admitting: Orthopedic Surgery

## 2019-01-30 DIAGNOSIS — Z20828 Contact with and (suspected) exposure to other viral communicable diseases: Secondary | ICD-10-CM | POA: Diagnosis not present

## 2019-01-30 DIAGNOSIS — Z01812 Encounter for preprocedural laboratory examination: Secondary | ICD-10-CM | POA: Insufficient documentation

## 2019-01-30 LAB — SARS CORONAVIRUS 2 (TAT 6-24 HRS): SARS Coronavirus 2: NEGATIVE

## 2019-02-01 ENCOUNTER — Encounter (HOSPITAL_COMMUNITY): Payer: Self-pay | Admitting: *Deleted

## 2019-02-01 ENCOUNTER — Other Ambulatory Visit: Payer: Self-pay

## 2019-02-01 NOTE — H&P (Signed)
Orthopaedic Trauma Service (OTS) Consult   Patient ID: Stephanie Martinez MRN: [id number] DOB/AGE: 1922/02/05 85 y.o.     HPI: Stephanie Martinez is an 85 y.o. female well known to OTS. She sustained a closed Left humerus fracture 09/10/2018. Pt wanted to try to treat it non-operatively but the fracture has failed to unite and it is causing her much discomfort and impairs her ADLs. 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 Anemia   . Arthritis   . Breast cancer ([place])    lumpectomy and radiation  . CKD (chronic kidney disease), stage III ([place])   . Colon cancer ([place])    resection  . GERD (gastroesophageal reflux disease)   . Glaucoma   . Hyperlipidemia   . Hypertension   . Meniere disease   . Osteoporosis   . Overactive thyroid gland    treated with liquid iodine  . Vertigo    doing PT     Past Surgical History:  Procedure Laterality Date  . APPENDECTOMY    . BREAST LUMPECTOMY Left   . BREAST SURGERY     lumpectomy-left  . ORIF HUMERUS FRACTURE Right 11/26/2014   Procedure: OPEN REDUCTION INTERNAL FIXATION (ORIF) HUMERAL SHAFT FRACTURE;  Surgeon: Altamese Hartselle, MD;  Location: [facility];  Service: Orthopedics;  Laterality: Right;  . SMALL INTESTINE SURGERY     resection  . SPINE SURGERY     x2  . TONSILLECTOMY AND ADENOIDECTOMY  before age 22  . TOTAL HIP ARTHROPLASTY Bilateral    bilateral one 20 years ago, other 8-10 years ago    Family History  Problem Relation Age of Onset  . Stroke Mother   . Colon cancer Father        dx in 38s  . Lung cancer Sister 10  . BRCA 1/2 Daughter        BRCA2 positive  . BRCA 1/2 Daughter        BRCA2 positive  . Breast cancer Sister 59       bilateral breast cancer, 2nd dx at 71  . Colon cancer Sister 44  . Breast cancer Sister 40       bilateral breast cancer; 2nd dx age 51  . Leukemia Other        dx in his 17s, youngest sister's son  . Hypertension Other       materal side  . Breast cancer Paternal Aunt        <50  . Thyroid disease Daughter   . Heart disease Neg Hx   . Hyperlipidemia Neg Hx     Social History:  reports that she has quit smoking. Her smoking use included cigarettes. She quit after 35.00 years of use. She has never used smokeless tobacco. She reports current alcohol use of about 5.0 - 6.0 standard drinks of alcohol per week. She reports that she does not use drugs.  Allergies:  Allergies  Allergen Reactions  . Iodine Anaphylaxis  . Amlodipine Other (See Comments)    Leg edema    Medications: I have reviewed the patient's current medications. Current Meds  Medication Sig  . acetaminophen (TYLENOL) 500 MG tablet Take 500 mg by mouth every 6 (six) hours as needed (pain.).   Marland Kitchen Calcium-Magnesium-Zinc (CAL-MAG-ZINC PO) Take 1 tablet by mouth daily before lunch.  . carvedilol (COREG) 12.5 MG tablet TAKE 1 TABLET BY MOUTH TWICE DAILY (Patient  taking differently: Take 12.5 mg by mouth 2 (two) times daily. )  . Cholecalciferol (VITAMIN D3) 50 MCG (2000 UT) TABS Take 2,000 Units by mouth daily before lunch.  . diphenhydramine-acetaminophen (TYLENOL PM) 25-500 MG TABS tablet Take 2 tablets by mouth at bedtime.  Marland Kitchen latanoprost (XALATAN) 0.005 % ophthalmic solution Place 1 drop into both eyes at bedtime.   . Lutein 20 MG CAPS Take 20 mg by mouth daily before lunch.  . pantoprazole (PROTONIX) 40 MG tablet TAKE 1 TABLET BY MOUTH EVERY DAY 30 TO 60 MINUTES BEFORE FIRST MEAL OF THE DAY (Patient taking differently: Take 40 mg by mouth daily before breakfast. TAKE 1 TABLET BY MOUTH EVERY DAY 30 TO 60 MINUTES BEFORE FIRST MEAL OF THE DAY)  . vitamin B-12 (CYANOCOBALAMIN) 1000 MCG tablet Take 1,000 mcg by mouth daily before lunch.     Results for orders placed or performed during the hospital encounter of 02/02/19 (from the past 48 hour(s))  CBC WITH DIFFERENTIAL     Status: None   Collection Time: 02/02/19  6:15 AM  Result Value Ref Range    WBC 9.4 4.0 - 10.5 K/uL   RBC 4.29 3.87 - 5.11 MIL/uL   Hemoglobin 13.3 12.0 - 15.0 g/dL   HCT 40.5 36.0 - 46.0 %   MCV 94.4 80.0 - 100.0 fL   MCH 31.0 26.0 - 34.0 pg   MCHC 32.8 30.0 - 36.0 g/dL   RDW 12.3 11.5 - 15.5 %   Platelets 278 150 - 400 K/uL   nRBC 0.0 0.0 - 0.2 %   Neutrophils Relative % 73 %   Neutro Abs 6.9 1.7 - 7.7 K/uL   Lymphocytes Relative 13 %   Lymphs Abs 1.2 0.7 - 4.0 K/uL   Monocytes Relative 7 %   Monocytes Absolute 0.6 0.1 - 1.0 K/uL   Eosinophils Relative 5 %   Eosinophils Absolute 0.5 0.0 - 0.5 K/uL   Basophils Relative 1 %   Basophils Absolute 0.1 0.0 - 0.1 K/uL   Immature Granulocytes 1 %   Abs Immature Granulocytes 0.05 0.00 - 0.07 K/uL    Comment: Performed at [facility], [address] [place], [place] [address]  Comprehensive metabolic panel     Status: Abnormal   Collection Time: 02/02/19  6:15 AM  Result Value Ref Range   Sodium 137 135 - 145 mmol/L   Potassium 3.7 3.5 - 5.1 mmol/L   Chloride 103 98 - 111 mmol/L   CO2 24 22 - 32 mmol/L   Glucose, Bld 114 (H) 70 - 99 mg/dL   BUN 31 (H) 8 - 23 mg/dL   Creatinine, Ser 1.54 (H) 0.44 - 1.00 mg/dL   Calcium 9.7 8.9 - 10.3 mg/dL   Total Protein 7.1 6.5 - 8.1 g/dL   Albumin 3.6 3.5 - 5.0 g/dL   AST 20 15 - 41 U/L   ALT 14 0 - 44 U/L   Alkaline Phosphatase 81 38 - 126 U/L   Total Bilirubin 0.6 0.3 - 1.2 mg/dL   GFR calc non Af Amer 28 (L) >60 mL/min   GFR calc Af Amer 33 (L) >60 mL/min   Anion gap 10 5 - 15    Comment: Performed at [facility] [address], [place], [place] [address]  Protime-INR     Status: None   Collection Time: 02/02/19  6:15 AM  Result Value Ref Range   Prothrombin Time 13.2 11.4 - 15.2 seconds  INR 1.0 0.8 - 1.2    Comment: (NOTE) INR goal varies based on device and disease states. Performed at [facility], [place] [address], [place], [place] [address]   APTT     Status: None   Collection Time: 02/02/19  6:15 AM  Result Value Ref Range    aPTT 32 24 - 36 seconds    Comment: Performed at [facility] [address], [place], [place] [address]    Dg Chest Portable 1 View  Result Date: 02/02/2019 CLINICAL DATA:  Preop evaluation EXAM: PORTABLE CHEST 1 VIEW COMPARISON:  01/22/2016, recent CT from 12/06/2018 FINDINGS: Cardiac shadow is at the upper limits of normal in size. Aortic calcifications are seen. The lungs are well aerated bilaterally without focal infiltrate or sizable effusion. No acute bony abnormality is noted. The known left upper lobe nodular density is not as well appreciated on this exam. IMPRESSION: No acute abnormality noted. Electronically Signed   By: Inez Catalina M.D.   On: 02/02/2019 07:17    Review of Systems  Constitutional: Negative for chills and fever.  Respiratory: Negative for shortness of breath.   Cardiovascular: Negative for chest pain and palpitations.  Gastrointestinal: Negative for abdominal pain, nausea and vomiting.  Neurological: Negative for tingling and sensory change.   Pulse 87, temperature 98.1 F (36.7 C), resp. rate 18, SpO2 97 %. Physical Exam Constitutional:      Comments: Pleasant female, NAD   Cardiovascular:     Rate and Rhythm: Normal rate.     Heart sounds: S1 normal and S2 normal.  Pulmonary:     Effort: No accessory muscle usage or respiratory distress.  Musculoskeletal:     Comments: Left Upper Extremity  + motion through fracture site of L upper arm Motor and sensory functions intact Ext warm  + radial pulse Excellent elbow and wrist ROM  Minimal swelling Skin and soft tissue look excellent as well   Neurological:     Mental Status: She is alert.  Psychiatric:        Attention and Perception: Attention normal.        Mood and Affect: Mood and affect normal.        Behavior: Behavior is cooperative.    Assessment/Plan:  85 y/o RHD white female with L humeral shaft nonunion   -L humeral shaft nonunion   To OR for ORIF L humerus nonunion   Risks  and benefits reviewed with pt and daughter. They wish to proceed  Will restrict active abduction for 6 weeks post op and no lifting >5 lbs x 6 weeks o/w ok to perform ROM exercises   Sling for comfort    outpt vs overnight observation   - Pain management:  Titrate accordingly   - ID:   periop abx   - Activity:  As above  - Dispo:  OR for ORIF L humerus nonunion     Jari Pigg, PA-C [phone] (C) 02/02/2019, 7:27 AM  [facility] [place] [address] [phone] Domingo Sep (F)

## 2019-02-01 NOTE — Progress Notes (Signed)
Spoke with daughter, Izora Gala for pre-op call. DPR on file. She states pt does not have a cardiac history and is not diabetic.  Pt had Covid test done on 01/30/19 and it was negative. Pt has been in quarantine since test done.   Coronavirus Screening  Have you experienced the following symptoms:  Cough NO Fever (>100.36F)  NO Runny nose NO Sore throat NO Difficulty breathing/shortness of breath  NO  Have you or a family member traveled in the last 14 days and where? NO    Izora Gala was reminded that hospital visitation restrictions are in effect and the importance of the restrictions. Izora Gala may sit in waiting area while pt is in pre-op, surgery and PACU. Informed her that she could visit pt during visiting hours once she is admitted to a floor.

## 2019-02-02 ENCOUNTER — Ambulatory Visit (HOSPITAL_COMMUNITY): Payer: Medicare Other

## 2019-02-02 ENCOUNTER — Ambulatory Visit (HOSPITAL_COMMUNITY): Payer: Medicare Other | Admitting: Certified Registered Nurse Anesthetist

## 2019-02-02 ENCOUNTER — Observation Stay (HOSPITAL_COMMUNITY)
Admission: RE | Admit: 2019-02-02 | Discharge: 2019-02-03 | Disposition: A | Discharge status: Home / Self Care | Payer: Medicare Other | Attending: Orthopedic Surgery | Admitting: Orthopedic Surgery

## 2019-02-02 ENCOUNTER — Encounter (HOSPITAL_COMMUNITY)
Admission: RE | Disposition: A | Discharge status: Home / Self Care | Payer: Self-pay | Source: Home / Self Care | Attending: Orthopedic Surgery

## 2019-02-02 ENCOUNTER — Encounter (HOSPITAL_COMMUNITY): Payer: Self-pay | Admitting: Surgery

## 2019-02-02 DIAGNOSIS — Z85038 Personal history of other malignant neoplasm of large intestine: Secondary | ICD-10-CM | POA: Insufficient documentation

## 2019-02-02 DIAGNOSIS — N183 Chronic kidney disease, stage 3 (moderate): Secondary | ICD-10-CM | POA: Diagnosis not present

## 2019-02-02 DIAGNOSIS — K219 Gastro-esophageal reflux disease without esophagitis: Secondary | ICD-10-CM | POA: Diagnosis not present

## 2019-02-02 DIAGNOSIS — Z923 Personal history of irradiation: Secondary | ICD-10-CM | POA: Insufficient documentation

## 2019-02-02 DIAGNOSIS — Z96643 Presence of artificial hip joint, bilateral: Secondary | ICD-10-CM | POA: Diagnosis not present

## 2019-02-02 DIAGNOSIS — J449 Chronic obstructive pulmonary disease, unspecified: Secondary | ICD-10-CM | POA: Diagnosis not present

## 2019-02-02 DIAGNOSIS — H409 Unspecified glaucoma: Secondary | ICD-10-CM | POA: Insufficient documentation

## 2019-02-02 DIAGNOSIS — Z87891 Personal history of nicotine dependence: Secondary | ICD-10-CM | POA: Insufficient documentation

## 2019-02-02 DIAGNOSIS — Z79899 Other long term (current) drug therapy: Secondary | ICD-10-CM | POA: Diagnosis not present

## 2019-02-02 DIAGNOSIS — Z853 Personal history of malignant neoplasm of breast: Secondary | ICD-10-CM | POA: Insufficient documentation

## 2019-02-02 DIAGNOSIS — E559 Vitamin D deficiency, unspecified: Secondary | ICD-10-CM | POA: Diagnosis present

## 2019-02-02 DIAGNOSIS — I10 Essential (primary) hypertension: Secondary | ICD-10-CM | POA: Diagnosis present

## 2019-02-02 DIAGNOSIS — Z419 Encounter for procedure for purposes other than remedying health state, unspecified: Secondary | ICD-10-CM

## 2019-02-02 DIAGNOSIS — X58XXXD Exposure to other specified factors, subsequent encounter: Secondary | ICD-10-CM | POA: Diagnosis not present

## 2019-02-02 DIAGNOSIS — I129 Hypertensive chronic kidney disease with stage 1 through stage 4 chronic kidney disease, or unspecified chronic kidney disease: Secondary | ICD-10-CM | POA: Diagnosis not present

## 2019-02-02 DIAGNOSIS — S42332K Displaced oblique fracture of shaft of humerus, left arm, subsequent encounter for fracture with nonunion: Secondary | ICD-10-CM | POA: Diagnosis not present

## 2019-02-02 DIAGNOSIS — Z9049 Acquired absence of other specified parts of digestive tract: Secondary | ICD-10-CM | POA: Insufficient documentation

## 2019-02-02 DIAGNOSIS — M810 Age-related osteoporosis without current pathological fracture: Secondary | ICD-10-CM | POA: Diagnosis present

## 2019-02-02 DIAGNOSIS — Z01818 Encounter for other preprocedural examination: Secondary | ICD-10-CM

## 2019-02-02 DIAGNOSIS — S42302K Unspecified fracture of shaft of humerus, left arm, subsequent encounter for fracture with nonunion: Secondary | ICD-10-CM | POA: Diagnosis present

## 2019-02-02 HISTORY — PX: ORIF HUMERUS FRACTURE: SHX2126

## 2019-02-02 HISTORY — DX: Anemia, unspecified: D64.9

## 2019-02-02 HISTORY — PX: ORIF HUMERAL SHAFT FRACTURE: SUR937

## 2019-02-02 LAB — CBC WITH DIFFERENTIAL/PLATELET
Abs Immature Granulocytes: 0.05 10*3/uL (ref 0.00–0.07)
Basophils Absolute: 0.1 10*3/uL (ref 0.0–0.1)
Basophils Relative: 1 %
Eosinophils Absolute: 0.5 10*3/uL (ref 0.0–0.5)
Eosinophils Relative: 5 %
HCT: 40.5 % (ref 36.0–46.0)
Hemoglobin: 13.3 g/dL (ref 12.0–15.0)
Immature Granulocytes: 1 %
Lymphocytes Relative: 13 %
Lymphs Abs: 1.2 10*3/uL (ref 0.7–4.0)
MCH: 31 pg (ref 26.0–34.0)
MCHC: 32.8 g/dL (ref 30.0–36.0)
MCV: 94.4 fL (ref 80.0–100.0)
Monocytes Absolute: 0.6 10*3/uL (ref 0.1–1.0)
Monocytes Relative: 7 %
Neutro Abs: 6.9 10*3/uL (ref 1.7–7.7)
Neutrophils Relative %: 73 %
Platelets: 278 10*3/uL (ref 150–400)
RBC: 4.29 MIL/uL (ref 3.87–5.11)
RDW: 12.3 % (ref 11.5–15.5)
WBC: 9.4 10*3/uL (ref 4.0–10.5)
nRBC: 0 % (ref 0.0–0.2)

## 2019-02-02 LAB — COMPREHENSIVE METABOLIC PANEL
ALT: 14 U/L (ref 0–44)
AST: 20 U/L (ref 15–41)
Albumin: 3.6 g/dL (ref 3.5–5.0)
Alkaline Phosphatase: 81 U/L (ref 38–126)
Anion gap: 10 (ref 5–15)
BUN: 31 mg/dL — ABNORMAL HIGH (ref 8–23)
CO2: 24 mmol/L (ref 22–32)
Calcium: 9.7 mg/dL (ref 8.9–10.3)
Chloride: 103 mmol/L (ref 98–111)
Creatinine, Ser: 1.54 mg/dL — ABNORMAL HIGH (ref 0.44–1.00)
GFR calc Af Amer: 33 mL/min — ABNORMAL LOW (ref >60)
GFR calc non Af Amer: 28 mL/min — ABNORMAL LOW (ref >60)
Glucose, Bld: 114 mg/dL — ABNORMAL HIGH (ref 70–99)
Potassium: 3.7 mmol/L (ref 3.5–5.1)
Sodium: 137 mmol/L (ref 135–145)
Total Bilirubin: 0.6 mg/dL (ref 0.3–1.2)
Total Protein: 7.1 g/dL (ref 6.5–8.1)

## 2019-02-02 LAB — PROTIME-INR
INR: 1 (ref 0.8–1.2)
Prothrombin Time: 13.2 seconds (ref 11.4–15.2)

## 2019-02-02 LAB — SURGICAL PCR SCREEN
MRSA, PCR: NEGATIVE
Staphylococcus aureus: NEGATIVE

## 2019-02-02 LAB — APTT: aPTT: 32 seconds (ref 24–36)

## 2019-02-02 SURGERY — OPEN REDUCTION INTERNAL FIXATION (ORIF) HUMERAL SHAFT FRACTURE
Anesthesia: General | Site: Arm Upper | Laterality: Left

## 2019-02-02 MED ORDER — POTASSIUM CHLORIDE IN NACL 20-0.9 MEQ/L-% IV SOLN
INTRAVENOUS | Status: DC
  Administered 2019-02-02 (×2): via INTRAVENOUS
  Filled 2019-02-02: qty 1000

## 2019-02-02 MED ORDER — ONDANSETRON HCL 4 MG/2ML IJ SOLN
INTRAMUSCULAR | Status: DC | PRN
  Administered 2019-02-02: 4 mg via INTRAVENOUS

## 2019-02-02 MED ORDER — VITAMIN B-12 1000 MCG PO TABS
1000.0000 ug | ORAL_TABLET | Freq: Every day | ORAL | Status: DC
  Administered 2019-02-02 – 2019-02-03 (×2): 1000 ug via ORAL
  Filled 2019-02-02 (×2): qty 1

## 2019-02-02 MED ORDER — FENTANYL CITRATE (PF) 100 MCG/2ML IJ SOLN: 25.0000 ug | INTRAMUSCULAR | Status: DC | PRN

## 2019-02-02 MED ORDER — VITAMIN D3 25 MCG (1000 UNIT) PO TABS
2000.0000 [IU] | ORAL_TABLET | Freq: Every day | ORAL | Status: DC
  Administered 2019-02-02: 14:00:00 2000 [IU] via ORAL
  Filled 2019-02-02 (×4): qty 2

## 2019-02-02 MED ORDER — PROPOFOL 10 MG/ML IV BOLUS
INTRAVENOUS | Status: AC
  Filled 2019-02-02: qty 20

## 2019-02-02 MED ORDER — FENTANYL CITRATE (PF) 250 MCG/5ML IJ SOLN
INTRAMUSCULAR | Status: AC
  Filled 2019-02-02: qty 5

## 2019-02-02 MED ORDER — MORPHINE SULFATE (PF) 2 MG/ML IV SOLN: 0.5000 mg | INTRAVENOUS | Status: DC | PRN

## 2019-02-02 MED ORDER — SUCCINYLCHOLINE CHLORIDE 20 MG/ML IJ SOLN
INTRAMUSCULAR | Status: DC | PRN
  Administered 2019-02-02: 100 mg via INTRAVENOUS

## 2019-02-02 MED ORDER — FENTANYL CITRATE (PF) 100 MCG/2ML IJ SOLN
50.0000 ug | Freq: Once | INTRAMUSCULAR | Status: AC
  Administered 2019-02-02: 50 ug via INTRAVENOUS

## 2019-02-02 MED ORDER — CARVEDILOL 12.5 MG PO TABS
12.5000 mg | ORAL_TABLET | Freq: Two times a day (BID) | ORAL | Status: DC
  Administered 2019-02-02 – 2019-02-03 (×3): 12.5 mg via ORAL
  Filled 2019-02-02 (×3): qty 1

## 2019-02-02 MED ORDER — FENTANYL CITRATE (PF) 100 MCG/2ML IJ SOLN
INTRAMUSCULAR | Status: AC
  Administered 2019-02-02: 50 ug via INTRAVENOUS
  Filled 2019-02-02: qty 2

## 2019-02-02 MED ORDER — PROPOFOL 10 MG/ML IV BOLUS
INTRAVENOUS | Status: DC | PRN
  Administered 2019-02-02: 100 mg via INTRAVENOUS

## 2019-02-02 MED ORDER — CEFAZOLIN SODIUM-DEXTROSE 2-4 GM/100ML-% IV SOLN
2.0000 g | INTRAVENOUS | Status: DC
  Filled 2019-02-02: qty 100

## 2019-02-02 MED ORDER — LATANOPROST 0.005 % OP SOLN
1.0000 [drp] | Freq: Every day | OPHTHALMIC | Status: DC
  Administered 2019-02-02: 1 [drp] via OPHTHALMIC
  Filled 2019-02-02: qty 2.5

## 2019-02-02 MED ORDER — MUPIROCIN 2 % EX OINT
1.0000 "application " | TOPICAL_OINTMENT | Freq: Once | CUTANEOUS | Status: AC
  Administered 2019-02-02: 1 via TOPICAL

## 2019-02-02 MED ORDER — MUPIROCIN 2 % EX OINT
TOPICAL_OINTMENT | CUTANEOUS | Status: AC
  Administered 2019-02-02: 1 via TOPICAL
  Filled 2019-02-02: qty 22

## 2019-02-02 MED ORDER — CEFAZOLIN SODIUM-DEXTROSE 1-4 GM/50ML-% IV SOLN
1.0000 g | Freq: Four times a day (QID) | INTRAVENOUS | Status: AC
  Administered 2019-02-02 – 2019-02-03 (×3): 1 g via INTRAVENOUS
  Filled 2019-02-02 (×3): qty 50

## 2019-02-02 MED ORDER — BUPIVACAINE HCL (PF) 0.5 % IJ SOLN
INTRAMUSCULAR | Status: DC | PRN
  Administered 2019-02-02: 15 mL

## 2019-02-02 MED ORDER — DEXAMETHASONE SODIUM PHOSPHATE 4 MG/ML IJ SOLN
INTRAMUSCULAR | Status: DC | PRN
  Administered 2019-02-02: 5 mg via INTRAVENOUS

## 2019-02-02 MED ORDER — METOPROLOL TARTRATE 5 MG/5ML IV SOLN
INTRAVENOUS | Status: DC | PRN
  Administered 2019-02-02 (×2): 1 mg via INTRAVENOUS

## 2019-02-02 MED ORDER — METOCLOPRAMIDE HCL 5 MG/ML IJ SOLN: 10.0000 mg | Freq: Once | INTRAMUSCULAR | Status: DC | PRN

## 2019-02-02 MED ORDER — LACTATED RINGERS IV SOLN
INTRAVENOUS | Status: DC
  Administered 2019-02-02 (×2): via INTRAVENOUS

## 2019-02-02 MED ORDER — 0.9 % SODIUM CHLORIDE (POUR BTL) OPTIME
TOPICAL | Status: DC | PRN
  Administered 2019-02-02: 1000 mL

## 2019-02-02 MED ORDER — MEPERIDINE HCL 25 MG/ML IJ SOLN: 6.2500 mg | INTRAMUSCULAR | Status: DC | PRN

## 2019-02-02 MED ORDER — BUPIVACAINE LIPOSOME 1.3 % IJ SUSP
INTRAMUSCULAR | Status: DC | PRN
  Administered 2019-02-02: 10 mL

## 2019-02-02 MED ORDER — SODIUM CHLORIDE 0.9 % IV SOLN
INTRAVENOUS | Status: DC | PRN
  Administered 2019-02-02: 09:00:00 25 ug/min via INTRAVENOUS

## 2019-02-02 MED ORDER — ACETAMINOPHEN 325 MG PO TABS
325.0000 mg | ORAL_TABLET | Freq: Four times a day (QID) | ORAL | Status: DC | PRN
  Administered 2019-02-03: 650 mg via ORAL
  Filled 2019-02-02: qty 2

## 2019-02-02 MED ORDER — PANTOPRAZOLE SODIUM 40 MG PO TBEC
40.0000 mg | DELAYED_RELEASE_TABLET | Freq: Every day | ORAL | Status: DC
  Administered 2019-02-02 – 2019-02-03 (×2): 40 mg via ORAL
  Filled 2019-02-02 (×2): qty 1

## 2019-02-02 MED ORDER — DOCUSATE SODIUM 100 MG PO CAPS
100.0000 mg | ORAL_CAPSULE | Freq: Two times a day (BID) | ORAL | Status: DC
  Administered 2019-02-02 – 2019-02-03 (×3): 100 mg via ORAL
  Filled 2019-02-02 (×3): qty 1

## 2019-02-02 MED ORDER — MENTHOL 3 MG MT LOZG: 1.0000 | LOZENGE | OROMUCOSAL | Status: DC | PRN

## 2019-02-02 MED ORDER — METOCLOPRAMIDE HCL 5 MG PO TABS: 5.0000 mg | ORAL_TABLET | Freq: Three times a day (TID) | ORAL | Status: DC | PRN

## 2019-02-02 MED ORDER — STERILE WATER FOR IRRIGATION IR SOLN
Status: DC | PRN
  Administered 2019-02-02: 1000 mL

## 2019-02-02 MED ORDER — ACETAMINOPHEN 500 MG PO TABS
500.0000 mg | ORAL_TABLET | Freq: Four times a day (QID) | ORAL | Status: DC | PRN
  Administered 2019-02-03 (×2): 500 mg via ORAL
  Filled 2019-02-02 (×2): qty 1

## 2019-02-02 MED ORDER — LIDOCAINE HCL (CARDIAC) PF 100 MG/5ML IV SOSY
PREFILLED_SYRINGE | INTRAVENOUS | Status: DC | PRN
  Administered 2019-02-02: 50 mg via INTRAVENOUS

## 2019-02-02 MED ORDER — PHENOL 1.4 % MT LIQD: 1.0000 | OROMUCOSAL | Status: DC | PRN

## 2019-02-02 MED ORDER — METOCLOPRAMIDE HCL 5 MG/ML IJ SOLN: 5.0000 mg | Freq: Three times a day (TID) | INTRAMUSCULAR | Status: DC | PRN

## 2019-02-02 MED ORDER — ONDANSETRON HCL 4 MG PO TABS: 4.0000 mg | ORAL_TABLET | Freq: Four times a day (QID) | ORAL | Status: DC | PRN

## 2019-02-02 MED ORDER — CHLORHEXIDINE GLUCONATE 4 % EX LIQD: 60.0000 mL | Freq: Once | CUTANEOUS | Status: DC

## 2019-02-02 MED ORDER — MIDAZOLAM HCL 2 MG/2ML IJ SOLN
INTRAMUSCULAR | Status: AC
  Filled 2019-02-02: qty 2

## 2019-02-02 MED ORDER — HYDROCODONE-ACETAMINOPHEN 5-325 MG PO TABS
1.0000 | ORAL_TABLET | ORAL | Status: DC | PRN
  Administered 2019-02-02: 1 via ORAL
  Filled 2019-02-02: qty 1

## 2019-02-02 MED ORDER — ONDANSETRON HCL 4 MG/2ML IJ SOLN: 4.0000 mg | Freq: Four times a day (QID) | INTRAMUSCULAR | Status: DC | PRN

## 2019-02-02 SURGICAL SUPPLY — 75 items
BENZOIN TINCTURE PRP APPL 2/3 (GAUZE/BANDAGES/DRESSINGS) ×2 IMPLANT
BIT DRILL 2.5X110 QC LCP DISP (BIT) ×1 IMPLANT
BIT DRILL PERC QC 2.8X200 100 (BIT) IMPLANT
BIT DRILL QC 3.5X110 (BIT) ×1 IMPLANT
BNDG ESMARK 4X9 LF (GAUZE/BANDAGES/DRESSINGS) ×2 IMPLANT
BNDG GAUZE ELAST 4 BULKY (GAUZE/BANDAGES/DRESSINGS) ×2 IMPLANT
BRUSH SCRUB EZ PLAIN DRY (MISCELLANEOUS) ×4 IMPLANT
CORD BIPOLAR FORCEPS 12FT (ELECTRODE) ×2 IMPLANT
COVER SURGICAL LIGHT HANDLE (MISCELLANEOUS) ×4 IMPLANT
COVER WAND RF STERILE (DRAPES) ×2 IMPLANT
DRAPE C-ARM 42X72 X-RAY (DRAPES) ×2 IMPLANT
DRAPE ORTHO SPLIT 77X108 STRL (DRAPES) ×2
DRAPE SURG ORHT 6 SPLT 77X108 (DRAPES) ×2 IMPLANT
DRAPE U-SHAPE 47X51 STRL (DRAPES) ×4 IMPLANT
DRILL BIT QUICK COUP 2.8MM 100 (BIT) ×1
DRSG ADAPTIC 3X8 NADH LF (GAUZE/BANDAGES/DRESSINGS) ×1 IMPLANT
DRSG MEPILEX BORDER 4X4 (GAUZE/BANDAGES/DRESSINGS) ×1 IMPLANT
DRSG MEPILEX SACRM 8.7X9.8 (GAUZE/BANDAGES/DRESSINGS) ×1 IMPLANT
DRSG PAD ABDOMINAL 8X10 ST (GAUZE/BANDAGES/DRESSINGS) ×2 IMPLANT
ELECT REM PT RETURN 9FT ADLT (ELECTROSURGICAL) ×2
ELECTRODE REM PT RTRN 9FT ADLT (ELECTROSURGICAL) ×1 IMPLANT
EVACUATOR 1/8 PVC DRAIN (DRAIN) IMPLANT
GAUZE SPONGE 4X4 12PLY STRL (GAUZE/BANDAGES/DRESSINGS) ×2 IMPLANT
GLOVE BIO SURGEON STRL SZ7.5 (GLOVE) ×2 IMPLANT
GLOVE BIO SURGEON STRL SZ8 (GLOVE) ×2 IMPLANT
GLOVE BIOGEL PI IND STRL 7.5 (GLOVE) ×1 IMPLANT
GLOVE BIOGEL PI IND STRL 8 (GLOVE) ×1 IMPLANT
GLOVE BIOGEL PI INDICATOR 7.5 (GLOVE) ×1
GLOVE BIOGEL PI INDICATOR 8 (GLOVE) ×1
GOWN STRL REUS W/ TWL LRG LVL3 (GOWN DISPOSABLE) ×2 IMPLANT
GOWN STRL REUS W/ TWL XL LVL3 (GOWN DISPOSABLE) ×1 IMPLANT
GOWN STRL REUS W/TWL LRG LVL3 (GOWN DISPOSABLE) ×2
GOWN STRL REUS W/TWL XL LVL3 (GOWN DISPOSABLE) ×2
KIT BASIN OR (CUSTOM PROCEDURE TRAY) ×2 IMPLANT
KIT INFUSE LRG II (Orthopedic Implant) ×1 IMPLANT
KIT TURNOVER KIT B (KITS) ×2 IMPLANT
MANIFOLD NEPTUNE II (INSTRUMENTS) ×2 IMPLANT
NDL HYPO 25X1 1.5 SAFETY (NEEDLE) ×1 IMPLANT
NEEDLE HYPO 25X1 1.5 SAFETY (NEEDLE) ×2 IMPLANT
NS IRRIG 1000ML POUR BTL (IV SOLUTION) ×2 IMPLANT
PACK ORTHO EXTREMITY (CUSTOM PROCEDURE TRAY) ×2 IMPLANT
PAD ARMBOARD 7.5X6 YLW CONV (MISCELLANEOUS) ×5 IMPLANT
PROS LCP PLATE 12 163M (Plate) ×2 IMPLANT
PROSTHESIS LCP PLATE 12 163M (Plate) IMPLANT
SCREW CANC FT 4.0X22 (Screw) ×1 IMPLANT
SCREW CORTEX 3.5 20MM (Screw) ×3 IMPLANT
SCREW CORTEX 3.5 26MM (Screw) ×1 IMPLANT
SCREW CORTEX 3.5 32MM (Screw) ×1 IMPLANT
SCREW LOCK CORT ST 3.5X20 (Screw) IMPLANT
SCREW LOCK CORT ST 3.5X26 (Screw) IMPLANT
SCREW LOCK CORT ST 3.5X32 (Screw) IMPLANT
SCREW LOCK T15 FT 24X3.5X2.9X (Screw) IMPLANT
SCREW LOCK T15 FT 28X3.5X2.9X (Screw) IMPLANT
SCREW LOCKING 3.5X24 (Screw) ×1 IMPLANT
SCREW LOCKING 3.5X26 (Screw) ×1 IMPLANT
SCREW LOCKING 3.5X28 (Screw) ×2 IMPLANT
SLING ARM FOAM STRAP MED (SOFTGOODS) ×1 IMPLANT
SPONGE LAP 18X18 RF (DISPOSABLE) ×2 IMPLANT
STAPLER VISISTAT 35W (STAPLE) ×2 IMPLANT
SUCTION FRAZIER HANDLE 10FR (MISCELLANEOUS) ×1
SUCTION TUBE FRAZIER 10FR DISP (MISCELLANEOUS) ×1 IMPLANT
SUT ETHILON 3 0 PS 1 (SUTURE) ×4 IMPLANT
SUT PDS AB 2-0 CT1 27 (SUTURE) IMPLANT
SUT VIC AB 0 CT1 27 (SUTURE) ×2
SUT VIC AB 0 CT1 27XBRD ANBCTR (SUTURE) ×2 IMPLANT
SUT VIC AB 2-0 CT1 27 (SUTURE) ×2
SUT VIC AB 2-0 CT1 TAPERPNT 27 (SUTURE) ×2 IMPLANT
SYR 5ML LL (SYRINGE) IMPLANT
SYR CONTROL 10ML LL (SYRINGE) ×2 IMPLANT
TOWEL GREEN STERILE (TOWEL DISPOSABLE) ×6 IMPLANT
TOWEL GREEN STERILE FF (TOWEL DISPOSABLE) ×2 IMPLANT
TRAY FOLEY MTR SLVR 16FR STAT (SET/KITS/TRAYS/PACK) IMPLANT
TUBE CONNECTING 12X1/4 (SUCTIONS) ×2 IMPLANT
WATER STERILE IRR 1000ML POUR (IV SOLUTION) ×2 IMPLANT
YANKAUER SUCT BULB TIP NO VENT (SUCTIONS) IMPLANT

## 2019-02-02 NOTE — Anesthesia Postprocedure Evaluation (Signed)
Anesthesia Post Note  Patient: Stephanie Martinez  Procedure(s) Performed: OPEN REDUCTION INTERNAL FIXATION (ORIF) LEFT HUMERAL SHAFT NONUNION (Left Arm Upper)     Patient location during evaluation: PACU Anesthesia Type: General Level of consciousness: awake and alert Pain management: pain level controlled Vital Signs Assessment: post-procedure vital signs reviewed and stable Respiratory status: spontaneous breathing, nonlabored ventilation, respiratory function stable and patient connected to nasal cannula oxygen Cardiovascular status: blood pressure returned to baseline and stable Postop Assessment: no apparent nausea or vomiting Anesthetic complications: no    Last Vitals:  Vitals:   02/02/19 1215 02/02/19 1235  BP: (!) 164/84 (!) 156/94  Pulse: 71 71  Resp: 18 18  Temp: (!) 36.4 C (!) 36.3 C  SpO2: 95% 94%    Last Pain:  Vitals:   02/02/19 1200  PainSc: 0-No pain                 Montez Hageman

## 2019-02-02 NOTE — Anesthesia Preprocedure Evaluation (Signed)
Anesthesia Evaluation  Patient identified by MRN, date of birth, ID band Patient awake    Reviewed: Allergy & Precautions, NPO status , Patient's Chart, lab work & pertinent test results  Airway Mallampati: II  TM Distance: >3 FB Neck ROM: Full    Dental no notable dental hx. (+) Upper Dentures, Lower Dentures   Pulmonary neg pulmonary ROS, former smoker,    Pulmonary exam normal breath sounds clear to auscultation       Cardiovascular hypertension, Pt. on medications negative cardio ROS Normal cardiovascular exam Rhythm:Regular Rate:Normal     Neuro/Psych negative neurological ROS  negative psych ROS   GI/Hepatic negative GI ROS, Neg liver ROS,   Endo/Other  negative endocrine ROS  Renal/GU negative Renal ROS  negative genitourinary   Musculoskeletal negative musculoskeletal ROS (+)   Abdominal   Peds negative pediatric ROS (+)  Hematology negative hematology ROS (+)   Anesthesia Other Findings HOH  Reproductive/Obstetrics negative OB ROS                             Anesthesia Physical Anesthesia Plan  ASA: II  Anesthesia Plan: General   Post-op Pain Management:  Regional for Post-op pain   Induction: Intravenous  PONV Risk Score and Plan: 3 and Ondansetron and Treatment may vary due to age or medical condition  Airway Management Planned: Oral ETT  Additional Equipment:   Intra-op Plan:   Post-operative Plan: Extubation in OR  Informed Consent: I have reviewed the patients History and Physical, chart, labs and discussed the procedure including the risks, benefits and alternatives for the proposed anesthesia with the patient or authorized representative who has indicated his/her understanding and acceptance.     Dental advisory given  Plan Discussed with: CRNA  Anesthesia Plan Comments:         Anesthesia Quick Evaluation

## 2019-02-02 NOTE — Anesthesia Procedure Notes (Signed)
Anesthesia Regional Block: Supraclavicular block   Pre-Anesthetic Checklist: ,, timeout performed, Correct Patient, Correct Site, Correct Laterality, Correct Procedure, Correct Position, site marked, Risks and benefits discussed,  Surgical consent,  Pre-op evaluation,  At surgeon's request and post-op pain management  Laterality: Left and Upper  Prep: Maximum Sterile Barrier Precautions used, chloraprep       Needles:  Injection technique: Single-shot  Needle Type: Echogenic Stimulator Needle     Needle Length: 10cm      Additional Needles:   Procedures:,,,, ultrasound used (permanent image in chart),,,,  Narrative:  Start time: 02/02/2019 7:38 AM End time: 02/02/2019 7:48 AM Injection made incrementally with aspirations every 5 mL.  Performed by: Personally  Anesthesiologist: Montez Hageman, MD  Additional Notes: Risks, benefits and alternative to block explained extensively.  Patient tolerated procedure well, without complications.

## 2019-02-02 NOTE — Transfer of Care (Signed)
Immediate Anesthesia Transfer of Care Note  Patient: Stephanie Martinez  Procedure(s) Performed: OPEN REDUCTION INTERNAL FIXATION (ORIF) LEFT HUMERAL SHAFT NONUNION (Left Arm Upper)  Patient Location: PACU  Anesthesia Type:General  Level of Consciousness: drowsy, patient cooperative and responds to stimulation  Airway & Oxygen Therapy: Patient Spontanous Breathing  Post-op Assessment: Report given to RN and Post -op Vital signs reviewed and stable  Post vital signs: Reviewed and stable  Last Vitals:  Vitals Value Taken Time  BP 146/68 02/02/19 1059  Temp    Pulse 69 02/02/19 1101  Resp 21 02/02/19 1101  SpO2 99 % 02/02/19 1101  Vitals shown include unvalidated device data.  Last Pain:  Vitals:   02/02/19 0642  PainSc: 0-No pain      Patients Stated Pain Goal: 2 (79/39/68 8648)  Complications: No apparent anesthesia complications

## 2019-02-02 NOTE — Anesthesia Procedure Notes (Signed)
Procedure Name: Intubation Date/Time: 02/02/2019 8:28 AM Performed by: Oletta Lamas, CRNA Pre-anesthesia Checklist: Patient identified, Emergency Drugs available, Suction available and Patient being monitored Patient Re-evaluated:Patient Re-evaluated prior to induction Oxygen Delivery Method: Circle System Utilized Preoxygenation: Pre-oxygenation with 100% oxygen Induction Type: IV induction Ventilation: Mask ventilation without difficulty Laryngoscope Size: Miller and 2 Grade View: Grade I Tube type: Oral Number of attempts: 1 Airway Equipment and Method: Stylet and Oral airway Placement Confirmation: ETT inserted through vocal cords under direct vision,  positive ETCO2 and breath sounds checked- equal and bilateral Secured at: 19 cm Tube secured with: Tape Dental Injury: Teeth and Oropharynx as per pre-operative assessment

## 2019-02-03 ENCOUNTER — Encounter (HOSPITAL_COMMUNITY): Payer: Self-pay | Admitting: Orthopedic Surgery

## 2019-02-03 DIAGNOSIS — S42332K Displaced oblique fracture of shaft of humerus, left arm, subsequent encounter for fracture with nonunion: Secondary | ICD-10-CM | POA: Diagnosis not present

## 2019-02-03 DIAGNOSIS — M810 Age-related osteoporosis without current pathological fracture: Secondary | ICD-10-CM | POA: Diagnosis present

## 2019-02-03 DIAGNOSIS — I10 Essential (primary) hypertension: Secondary | ICD-10-CM | POA: Diagnosis present

## 2019-02-03 MED ORDER — HYDRALAZINE HCL 20 MG/ML IJ SOLN: 5.0000 mg | Freq: Four times a day (QID) | INTRAMUSCULAR | Status: DC | PRN

## 2019-02-03 NOTE — Progress Notes (Signed)
Pt given discharge instructions and gone over with her and daughter present. Both verbalized understanding of instructions and all questions answered. All belongings gathered to be sent home.

## 2019-02-03 NOTE — Evaluation (Signed)
Occupational Therapy Evaluation Patient Details Name: Stephanie Martinez MRN: [id number] DOB: [birth year] Today's Date: 02/03/2019    History of Present Illness Pt is a 85 y/o female who sustained a L humerus fx 09/10/18, s/p L ORIF L humeral shaft non union 02/02/19.  PMH: anemia, arthritis, claucoma, HTN, vertigo, spine surgery x 2, B total hip arthroplasty.    Clinical Impression   PTA patient reports needing some assist for ADLs and guarding assist for mobility using cane, daughter assists.  Admitted for above and limited by problem list below, including impaired balance, generalized weakness, decreased functional use of dominant L UE. Patient educated on sling mgmt, precautions, exercises, elevation/ice, ADl compensatory techniques and recommendations.  She requires min to max assist for ADLs, and mod assist for transfers. patinet will benefit from continued OT services while admitted and after dc at [facility] level in order to maximize independence and return of function of dominant L UE.  Will follow acutely.     Follow Up Recommendations  Home health OT;Supervision/Assistance - 24 hour    Equipment Recommendations  3 in 1 bedside commode    Recommendations for Other Services PT consult     Precautions / Restrictions Precautions Precautions: Shoulder Type of Shoulder Precautions: conservative protocol: no shoulder AROM, okay passive FF to 90, AROM E/W/H  Shoulder Interventions: Shoulder sling/immobilizer;Off for dressing/bathing/exercises;For comfort Precaution Booklet Issued: Yes (comment) Precaution Comments: reviewed precautions with pt  Required Braces or Orthoses: Sling Restrictions Weight Bearing Restrictions: Yes LUE Weight Bearing: Non weight bearing      Mobility Bed Mobility Overal bed mobility: Needs Assistance Bed Mobility: Supine to Sit;Sit to Supine     Supine to sit: Mod assist;HOB elevated Sit to supine: Mod assist;HOB elevated   General bed mobility  comments: pt requires support to trunk to ascend to EOB, trunk and LEs to supine   Transfers Overall transfer level: Needs assistance Equipment used: Quad cane Transfers: Sit to/from Stand Sit to Stand: Mod assist         General transfer comment: mod assist to power up, for safeyt and balance; pt anxious with mobility- very fearful of falling    Balance Overall balance assessment: Needs assistance Sitting-balance support: No upper extremity supported;Feet supported Sitting balance-Leahy Scale: Fair     Standing balance support: Single extremity supported;During functional activity Standing balance-Leahy Scale: Poor Standing balance comment: reliant on 1 UE and external support                           ADL either performed or assessed with clinical judgement   ADL Overall ADL's : Needs assistance/impaired     Grooming: Minimal assistance;Sitting   Upper Body Bathing: Sitting;Moderate assistance   Lower Body Bathing: Maximal assistance;Sit to/from stand   Upper Body Dressing : Sitting;Moderate assistance   Lower Body Dressing: Maximal assistance;Sit to/from stand   Toilet Transfer: Moderate assistance;Ambulation Toilet Transfer Details (indicate cue type and reason): simulated by side stepping to [facility]         Functional mobility during ADLs: Minimal assistance;Cane;Moderate assistance General ADL Comments: pt limited by anxiety, L UE pain/restrictions, and impaired balance/ generalized weakness      Vision Baseline Vision/History: Glaucoma       Perception     Praxis      Pertinent Vitals/Pain Pain Assessment: Faces Faces Pain Scale: Hurts little more Pain Location: L UE  Pain Descriptors / Indicators: Discomfort;Grimacing;Operative site guarding Pain Intervention(s): Monitored during session;Repositioned;Ice applied  Hand Dominance Left   Extremity/Trunk Assessment Upper Extremity Assessment Upper Extremity Assessment: LUE  deficits/detail;Generalized weakness LUE Deficits / Details: s/p ORIF, limited by pain and mobility restrictions; [facility] elbow/wrist/hand  LUE: Unable to fully assess due to pain;Unable to fully assess due to immobilization LUE Sensation: WNL LUE Coordination: decreased fine motor;decreased gross motor   Lower Extremity Assessment Lower Extremity Assessment: Defer to PT evaluation       Communication Communication Communication: HOH   Cognition Arousal/Alertness: Awake/alert Behavior During Therapy: Anxious Overall Cognitive Status: Within Functional Limits for tasks assessed                                 General Comments: appears [facility]    General Comments       Exercises Exercises: Hand exercises;Shoulder Shoulder Exercises Shoulder Flexion: PROM;Left;10 reps;Supine(allowed to 90, limited to 40 today) Shoulder Extension: PROM;Left;10 reps;Supine Elbow Flexion: AAROM;10 reps;Left;Supine Elbow Extension: AAROM;Left;10 reps;Supine Wrist Flexion: AROM;Left;10 reps;Supine Wrist Extension: AROM;Left;10 reps;Supine Digit Composite Flexion: AROM;Left;10 reps;Supine Composite Extension: AROM;Left;10 reps;Supine Hand Exercises Forearm Supination: AROM;Left;10 reps;Supine Forearm Pronation: AROM;Left;10 reps;Supine   Shoulder Instructions Shoulder Instructions Donning/doffing shirt without moving shoulder: Moderate assistance Method for sponge bathing under operated UE: Moderate assistance Donning/doffing sling/immobilizer: Maximal assistance Correct positioning of sling/immobilizer: Maximal assistance Pendulum exercises (written home exercise program): (provided handout, did not review with pt) ROM for elbow, wrist and digits of operated UE: Minimal assistance Sling wearing schedule (on at all times/off for ADL's): Minimal assistance Proper positioning of operated UE when showering: Minimal assistance Positioning of UE while sleeping: Minimal assistance    Home  Living Family/patient expects to be discharged to:: Private residence Living Arrangements: Children Available Help at Discharge: Family;Available 24 hours/day Type of Home: House       Home Layout: Able to live on main level with bedroom/bathroom;Two level     Bathroom Shower/Tub: Chief Strategy Officer: Environmental consultant - 2 wheels;Cane - quad;Tub bench;Toilet riser;Wheelchair - manual          Prior Functioning/Environment Level of Independence: Needs assistance  Gait / Transfers Assistance Needed: reports needing at least guarding assistance for mobility and transfers using quad cane  ADL's / Homemaking Assistance Needed: needs some assist for ADLs             OT Problem List: Decreased strength;Decreased range of motion;Decreased activity tolerance;Impaired balance (sitting and/or standing);Decreased knowledge of use of DME or AE;Decreased coordination;Decreased knowledge of precautions;Pain;Impaired UE functional use      OT Treatment/Interventions: Self-care/ADL training;Therapeutic exercise;Therapeutic activities;Patient/family education;Balance training    OT Goals(Current goals can be found in the care plan section) Acute Rehab OT Goals Patient Stated Goal: to get home and feel better OT Goal Formulation: With patient Time For Goal Achievement: 02/17/19 Potential to Achieve Goals: Good  OT Frequency: Min 2X/week   Barriers to D/C:            Co-evaluation              AM-PAC OT "6 Clicks" Daily Activity     Outcome Measure Help from another person eating meals?: A Little Help from another person taking care of personal grooming?: A Little Help from another person toileting, which includes using toliet, bedpan, or urinal?: A Lot Help from another person bathing (including washing, rinsing, drying)?: A Lot Help from another person to put on and taking off regular upper body clothing?:  A Lot Help from another person to put on and taking off  regular lower body clothing?: A Lot 6 Click Score: 14   End of Session Equipment Utilized During Treatment: Other (comment)(sling) Nurse Communication: Mobility status;Other (comment)(PT needs)  Activity Tolerance: Patient tolerated treatment well Patient left: in bed;with call bell/phone within reach  OT Visit Diagnosis: Unsteadiness on feet (R26.81);Pain;History of falling (Z91.81);Muscle weakness (generalized) (M62.81) Pain - Right/Left: Left Pain - part of body: Shoulder;Arm                Time: 4599-7741 OT Time Calculation (min): 39 min Charges:  OT General Charges $OT Visit: 1 Visit OT Evaluation $OT Eval Moderate Complexity: 1 Mod OT Treatments $Self Care/Home Management : 8-22 mins $Therapeutic Exercise: 8-22 mins  Delight Stare, OT Acute Rehabilitation Services Pager [phone] Office [phone]   Delight Stare 02/03/2019, 8:37 AM

## 2019-02-03 NOTE — Evaluation (Signed)
Physical Therapy Evaluation Patient Details Name: Stephanie Martinez MRN: [id number] DOB: 1922/03/07 Today's Date: 02/03/2019   History of Present Illness  Pt is a 85 y/o female who sustained a L humerus fx 09/10/18, s/p L ORIF L humeral shaft non union 02/02/19.  PMH: anemia, arthritis, glaucoma, HTN, vertigo, spine surgery x 2, B total hip arthroplasty.    Clinical Impression  Pt presented supine in bed with HOB elevated, awake and willing to participate in therapy session. Prior to admission, pt reported that she needed some assistance from daughter with transfers and mobility as well as with ADLs. Pt lives with her daughter who is there 24/7. At the time of evaluation, pt very anxious re: mobility and fearful of falling. She required mod A for bed mobility and min-mod A for transfers. If pt's daughter can provide the physical assistance needed upon d/c, then recommendation is for HHPT. However, if this is not the case would need to consider SNF for short term rehab. Pt would continue to benefit from skilled physical therapy services at this time while admitted and after d/c to address the below listed limitations in order to improve overall safety and independence with functional mobility.      Follow Up Recommendations Home health PT;Supervision/Assistance - 24 hour;Other (comment)(if family can provide 24/7 physical assistance; if not - SNF)    Equipment Recommendations  None recommended by PT    Recommendations for Other Services       Precautions / Restrictions Precautions Precautions: Shoulder Type of Shoulder Precautions: conservative protocol: no shoulder AROM, okay passive FF to 90, AROM E/W/H  Shoulder Interventions: Shoulder sling/immobilizer;Off for dressing/bathing/exercises;For comfort Precaution Booklet Issued: Yes (comment) Precaution Comments: reviewed precautions with pt  Required Braces or Orthoses: Sling Restrictions Weight Bearing Restrictions: Yes LUE Weight  Bearing: Non weight bearing      Mobility  Bed Mobility Overal bed mobility: Needs Assistance Bed Mobility: Supine to Sit     Supine to sit: Mod assist;HOB elevated Sit to supine: Mod assist;HOB elevated   General bed mobility comments: assistance needed for trunk elevation and to scoot forwards towards EOB in sitting  Transfers Overall transfer level: Needs assistance Equipment used: 1 person hand held assist Transfers: Sit to/from [facility] Sit to Stand: Mod assist Stand pivot transfers: Min assist       General transfer comment: mod A to power into standing from EOB, min A for stability with pivotal movement to chair towards pt's R side  Ambulation/Gait             General Gait Details: deferred - pt very anxious  Stairs            Wheelchair Mobility    Modified Rankin (Stroke Patients Only)       Balance Overall balance assessment: Needs assistance Sitting-balance support: Feet supported;Single extremity supported Sitting balance-Leahy Scale: Poor Sitting balance - Comments: required R UE support   Standing balance support: Single extremity supported;During functional activity Standing balance-Leahy Scale: Poor Standing balance comment: reliant on 1 UE and external support                             Pertinent Vitals/Pain Pain Assessment: No/denies pain Faces Pain Scale: Hurts little more Pain Location: L UE  Pain Descriptors / Indicators: Discomfort;Grimacing;Operative site guarding Pain Intervention(s): Monitored during session;Repositioned;Ice applied    Home Living Family/patient expects to be discharged to:: Private residence Living Arrangements: Children Available  Help at Discharge: Family;Available 24 hours/day Type of Home: House Home Access: Ramped entrance     Home Layout: Able to live on main level with bedroom/bathroom;Two level Home Equipment: Walker - 2 wheels;Cane - quad;Tub bench;Toilet  riser;Wheelchair - manual      Prior Function Level of Independence: Needs assistance   Gait / Transfers Assistance Needed: reports needing at least guarding assistance for mobility and transfers using quad cane   ADL's / Homemaking Assistance Needed: needs some assist for ADLs         Hand Dominance   Dominant Hand: Left    Extremity/Trunk Assessment   Upper Extremity Assessment Upper Extremity Assessment: Defer to OT evaluation     Lower Extremity Assessment Lower Extremity Assessment: Generalized weakness    Cervical / Trunk Assessment Cervical / Trunk Assessment: Kyphotic  Communication   Communication: HOH  Cognition Arousal/Alertness: Awake/alert Behavior During Therapy: Anxious Overall Cognitive Status: Within Functional Limits for tasks assessed                                 General Comments: appears [facility]       General Comments      Exercises    Assessment/Plan    PT Assessment Patient needs continued PT services  PT Problem List Decreased strength;Decreased range of motion;Decreased activity tolerance;Decreased balance;Decreased mobility;Decreased coordination;Decreased knowledge of use of DME;Decreased safety awareness;Decreased knowledge of precautions;Pain       PT Treatment Interventions DME instruction;Gait training;Stair training;Functional mobility training;Therapeutic activities;Balance training;Therapeutic exercise;Neuromuscular re-education;Patient/family education    PT Goals (Current goals can be found in the Care Plan section)  Acute Rehab PT Goals Patient Stated Goal: to get home and feel better PT Goal Formulation: With patient Time For Goal Achievement: 02/17/19 Potential to Achieve Goals: Good    Frequency Min 3X/week   Barriers to discharge        Co-evaluation               AM-PAC PT "6 Clicks" Mobility  Outcome Measure Help needed turning from your back to your side while in a flat bed without  using bedrails?: A Little Help needed moving from lying on your back to sitting on the side of a flat bed without using bedrails?: A Lot Help needed moving to and from a bed to a chair (including a wheelchair)?: A Little Help needed standing up from a chair using your arms (e.g., wheelchair or bedside chair)?: A Lot Help needed to walk in hospital room?: A Little Help needed climbing 3-5 steps with a railing? : A Little 6 Click Score: 16    End of Session Equipment Utilized During Treatment: Gait belt;Other (comment)(L UE sling) Activity Tolerance: Patient tolerated treatment well Patient left: in chair;with call bell/phone within reach Nurse Communication: Mobility status PT Visit Diagnosis: Other abnormalities of gait and mobility (R26.89);Muscle weakness (generalized) (M62.81)    Time: 8756-4332 PT Time Calculation (min) (ACUTE ONLY): 25 min   Charges:   PT Evaluation $PT Eval Moderate Complexity: 1 Mod PT Treatments $Therapeutic Activity: 8-22 mins        Sherie Don, PT, DPT  Acute Rehabilitation Services Pager [phone] Office [place] 02/03/2019, 9:59 AM

## 2019-02-03 NOTE — Discharge Summary (Signed)
Orthopaedic Trauma Service (OTS) Discharge Summary   Patient ID: Stephanie Martinez MRN: [id number] DOB/AGE: Dec 20, 1921 85 y.o.  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 Handy  Repair of a left humeral shaft nonunion Allografting left humerus nonunion site  Discharged Condition: good  Hospital Course:   Patient very pleasant 85 year old left-hand-dominant female who is well-known to the orthopedic trauma service.  She sustained a closed left humerus fracture several months ago and opted for nonoperative treatment.  Unfortunately she went on to develop symptomatic nonunion.  She presented to the hospital on 02/02/2019 for repair of her left humerus nonunion.  Patient was taken to the OR on 02/02/2019 for the procedure noted above.  Patient tolerated the procedure well.  We were able to achieve excellent fixation of her left humerus nonunion.  After surgery she was transferred to the PACU for recovery from anesthesia and then transferred to the orthopedic floor for continued observation, pain control and therapies.  Patient was hospitalized overnight for observation due to  age as well as blood pressure monitoring.  Patient did very well overnight and was deemed to be stable for discharge on postoperative day #1.  On postoperative day #1 she worked with therapies and did very well.  We reviewed her sling care, range of motion and weightbearing restrictions, exercises to perform and overall expectations.  Patient discharged in stable condition on postoperative day #1  Patient did receive the routine antibiotic coverage for surgical prophylaxis No pharmacologic DVT prophylaxis was utilized this is a low risk procedure and patient is mobile  Consults: None  Significant Diagnostic Studies: None  Treatments: IV hydration, antibiotics: Ancef, analgesia: acetaminophen, therapies: PT, OT and RN and surgery: As above  Discharge Exam:       Orthopedic Trauma Service Progress Note  Patient ID: Stephanie Martinez MRN: [id number] DOB/AGE: 04/15/1922 85 y.o.  Subjective:  Doing great but a little anxious/nervous  Ready to go home  Pain controlled with tylenol  No new issues   No h/a No CP  ROS As above  Objective:   VITALS:         Vitals:   02/02/19 2343 02/03/19 0426 02/03/19 0848 02/03/19 1104  BP: 136/78 111/90 (!) 184/88 (!) 168/88  Pulse: 81 86 83   Resp: 15 13    Temp: 97.6 F (36.4 C) 98.7 F (37.1 C) 98.3 F (36.8 C)   TempSrc:   Oral   SpO2: 94% 94% 97%     Estimated body mass index is 23.14 kg/m as calculated from the following:   Height as of 09/05/18: 5'  2" (1.575 m).   Weight as of 09/05/18: 57.4 kg.   Intake/Output      08/14 0701 - 08/15 0700 08/15 0701 - 08/16 0700   P.O.  240   I.V. 1408.2    IV Piggyback 595.8    Total Intake 2004 240   Urine 400    Blood 75    Total Output 475    Net +1529 +240          LABS  Lab Results Last 24 Hours  No results found for this or any previous visit (from the past 24 hour(s)).     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 02/02/19 2376 02/02/19 1234      .  POD/HD#: 1  95 left-hand-dominant female status post repair of left humerus nonunion  -Left humerus nonunion status post ORIF             Nonweightbearing left upper extremity x 4 weeks              No active abduction for roughly 6 weeks, no resistive activities for 8 weeks             Sling for comfort and on when mobilizing             Ice and elevate as needed for swelling and pain control              Would like for the patient to perform aggressive motion of her elbow forearm wrist and hand as much as possible.  Sling can be off when she is seated or in bed.              Dressing changes to start on 02/05/2019                         Okay to shower at that time                         Leave open to air once dry                         No ointments lotions or solutions are to be applied to the wound.  Clean with soap and water only  - Pain management:             Tylenol is sufficient             She does not want any additional medications  - ABL anemia/Hemodynamics  HTN                         Improved blood pressures                               No acute findings of note  - Medical issues               Home medications  - DVT/PE prophylaxis:             Does not require pharmacologic prophylaxis  - ID:              Perioperative antibiotics completed - Metabolic Bone Disease:             Continue vitamin D  - Activity:             As above - FEN/GI prophylaxis/Foley/Lines:             Regular diet   - Impediments to fracture healing:             Osteoporosis             Established nonunion  - Dispo:             Discharge home today             Follow-up with orthopedics in 10 to 14 days    Disposition: Discharge disposition: 01-Home or Self Care       Discharge Instructions    Call MD / Call 911   Complete by: As directed    If you experience chest pain or shortness of breath, CALL 911 and be transported to the hospital emergency room.  If you develope a fever above 101 F, pus (white drainage) or increased drainage or redness at the wound, or calf pain, call your surgeon's office.   Constipation Prevention   Complete by: As directed    Drink plenty of fluids.  Prune juice may be helpful.  You may use a stool softener, such as Colace (over the counter) 100 mg twice a day.  Use MiraLax (over the counter) for constipation as needed.   Diet - low sodium heart healthy   Complete by: As directed    Discharge instructions   Complete by: As directed    Orthopaedic Trauma Service Discharge Instructions   General Discharge Instructions  WEIGHT BEARING STATUS: Nonweightbearing left upper extremity  RANGE OF MOTION/ACTIVITY: No active shoulder abduction (chicken wing motion), okay to move shoulder in a forward and back motion.  Shoulder pendulums (clockwise and counterclockwise).  Unrestricted motion of elbow, forearm, wrist and hand.  Sling only needs to be on when ambulating or transferring.  A can be from bed or in the chair.  This is a prime time to work on elbow forearm wrist and hand motion  Bone health: Continue with vitamin D  Wound Care:  Wound care to start on 02/05/2019.  Please see below  Discharge Wound Care Instructions  Do NOT apply any ointments, solutions or lotions to pin sites or surgical wounds.  These prevent needed drainage and even though solutions like hydrogen peroxide kill bacteria, they also damage cells lining the pin sites that help fight infection.  Applying lotions or ointments can keep the wounds moist and can cause them to breakdown and open up as well. This can increase the risk for infection. When in doubt call the office.  Can change dressing for a new Mepilex.  If there is no drainage leave it open to the air.  Continue to change Mepilex every 2 to 3 days as long as drainage persists.  Once drainage stops you can leave the wound open to the air.  Please only clean wounds with soap and water.   Diet: as you were eating previously.  Can use over the counter stool softeners and bowel preparations, such as Miralax, to help with bowel movements.  Narcotics can be constipating.  Be sure to drink plenty of fluids  PAIN MEDICATION USE AND EXPECTATIONS  You have likely been given narcotic medications to help control your pain.  After a traumatic event that results in an fracture (broken bone) with or without surgery, it is ok to use narcotic pain medications to help control one's pain.  We understand that everyone responds to pain differently and each individual patient will be evaluated on a regular basis for the continued need for narcotic medications. Ideally, narcotic medication use should last no more than 6-8 weeks (coinciding with fracture healing).   As a patient it is your responsibility as well to monitor narcotic medication use and report the amount and frequency you use these medications when you come to your office visit.   We would also advise that if you are using narcotic medications, you should take a dose prior to therapy to maximize you participation.  IF YOU ARE ON NARCOTIC MEDICATIONS IT IS NOT  PERMISSIBLE TO OPERATE A MOTOR VEHICLE (MOTORCYCLE/CAR/TRUCK/MOPED) OR HEAVY MACHINERY DO NOT MIX NARCOTICS WITH OTHER CNS (CENTRAL NERVOUS SYSTEM) DEPRESSANTS SUCH AS ALCOHOL   STOP SMOKING OR USING NICOTINE PRODUCTS!!!!  As discussed nicotine severely impairs your body's ability to heal surgical and traumatic wounds but also impairs bone healing.  Wounds and bone heal by forming microscopic blood vessels (angiogenesis) and nicotine is a vasoconstrictor (essentially, shrinks blood vessels).  Therefore, if vasoconstriction occurs to these microscopic blood vessels they essentially disappear and are unable to deliver necessary nutrients to the healing tissue.  This is one modifiable factor that you can do to dramatically increase your chances of healing your injury.    (This means no smoking, no nicotine gum, patches, etc)  DO NOT USE NONSTEROIDAL ANTI-INFLAMMATORY DRUGS (NSAID'S)  Using products such as Advil (ibuprofen), Aleve (naproxen), Motrin (ibuprofen) for additional pain control during fracture healing can delay and/or prevent the healing response.  If you would like to take over the counter (OTC) medication, Tylenol (acetaminophen) is ok.  However, some narcotic medications that are given for pain control contain acetaminophen as well. Therefore, you should not exceed more than 4000 mg of tylenol in a day if you do not have liver disease.  Also note that there are may OTC medicines, such as cold medicines and allergy medicines that my contain tylenol as well.  If you have any questions about medications and/or interactions please ask your doctor/PA or your pharmacist.      ICE AND ELEVATE INJURED/OPERATIVE EXTREMITY  Using ice and elevating the injured extremity above your heart can help with swelling and pain control.  Icing in a pulsatile fashion, such as 20 minutes on and 20 minutes off, can be followed.    Do not place ice directly on skin. Make sure there is a barrier between to skin and  the ice pack.    Using frozen items such as frozen peas works well as the conform nicely to the are that needs to be iced.  USE AN ACE WRAP OR TED HOSE FOR SWELLING CONTROL  In addition to icing and elevation, Ace wraps or TED hose are used to help limit and resolve swelling.  It is recommended to use Ace wraps or TED hose until you are informed to stop.    When using Ace Wraps start the wrapping distally (farthest away from the body) and wrap proximally (closer to the body)   Example: If you had surgery on your leg or thing and you do not have a splint on, start the ace wrap at the toes and work your way up to the thigh        If you had surgery on your upper extremity and do not have a splint on, start the ace wrap at your fingers and work your way up to the upper arm  IF YOU ARE IN A SPLINT OR CAST DO NOT [place]   If your splint gets wet for any reason please contact the office immediately. You may shower in your splint or cast as long as you keep it dry.  This can be done by wrapping in a cast cover or garbage back (or similar)  Do Not stick any thing down your splint or cast such as pencils, money, or hangers to try and scratch yourself with.  If you feel itchy take benadryl as prescribed on the bottle for itching  IF YOU ARE IN A CAM BOOT (BLACK BOOT)  You may remove boot periodically. Perform daily dressing changes as noted below.  Wash the liner of the boot regularly and wear a sock when wearing the boot. It is recommended that you sleep in the boot until told otherwise  CALL THE OFFICE WITH ANY QUESTIONS OR CONCERNS: [phone]   Driving restrictions   Complete by: As directed    No driving   Increase activity slowly as tolerated   Complete by: As directed    Lifting restrictions   Complete by: As directed    No lifting   Non weight bearing   Complete by: As directed    Laterality: left   Extremity: Upper     Allergies as of 02/03/2019      Reactions    Iodine Anaphylaxis   Amlodipine Other (See Comments)   Leg edema      Medication List    STOP taking these medications   diphenoxylate-atropine 2.5-0.025 MG tablet Commonly known as: LOMOTIL     TAKE these medications   acetaminophen 500 MG tablet Commonly known as: TYLENOL Take 500 mg by mouth every 6 (six) hours as needed (pain.).   CAL-MAG-ZINC PO Take 1 tablet by mouth daily before lunch.   carvedilol 12.5 MG tablet Commonly known as: COREG TAKE 1 TABLET BY MOUTH TWICE DAILY   diphenhydramine-acetaminophen 25-500 MG Tabs tablet Commonly known as: TYLENOL PM Take 2 tablets by mouth at bedtime.   latanoprost 0.005 % ophthalmic solution Commonly known as: XALATAN Place 1 drop into both eyes at bedtime.   Lutein 20 MG Caps Take 20 mg by mouth daily before lunch.   pantoprazole 40 MG tablet Commonly known as: PROTONIX TAKE 1 TABLET BY MOUTH EVERY DAY 30 TO 60 MINUTES BEFORE FIRST MEAL OF THE DAY What changed: See the new instructions.   vitamin B-12 1000 MCG tablet Commonly known as: CYANOCOBALAMIN Take 1,000 mcg by mouth daily before lunch.   Vitamin D3 50 MCG (2000 UT) Tabs Take 2,000 Units by mouth daily before lunch.  Discharge Care Instructions  (From admission, onward)         Start     Ordered   02/03/19 0000  Non weight bearing    Question Answer Comment  Laterality left   Extremity Upper      02/03/19 1447         Follow-up Information    Altamese Harris, MD. Schedule an appointment as soon as possible for a visit on 02/14/2019.   Specialty: Orthopedic Surgery Contact information: [place] [address] [phone]           Discharge Instructions and Plan:  Patient has sustained a significant injury to her left arm with resultant nonunion.  We were able to achieve excellent fixation with  plate osteosynthesis. She will be nonweightbearing for around 4 weeks on her left upper extremity we may allow  her to utilize her left arm to push up from a chair or similar sooner than that but she will be restricted from any resistance activities for 8 weeks or so.  She will be restricted from active shoulder abduction for 6 weeks as well.  She has unrestricted range of motion of her shoulder with forward flexion and extension.  She can perform passive abduction.  Unrestricted range of motion of her elbow, forearm wrist and hand.  Sling is to be worn for comfort and when ambulatory or transferring.  Otherwise it can come off so she can work on range of motion exercises  Wound care has been reviewed.  Dressing changes will be performed on 02/05/2019.  A new Mepilex can be applied at that time and left in place for 2 to 3 days and subsequent change after that.  Once the wounds are completely dry the wound may remain open to the air.  Only clean wound with soap and water.  Patient will follow-up with [place] in 10 to 14 days for follow-up x-rays and removal of her sutures  No pharmacologic DVT prophylaxis as this is a lower risk surgery  Signed:  Jari Pigg, PA-C [phone] (C) 02/03/2019, 2:48 PM  [facility] [place] [address] [phone] Domingo Sep (F)

## 2019-02-03 NOTE — Progress Notes (Signed)
Occupational Therapy Treatment Patient Details Name: Stephanie Martinez MRN: [id number] DOB: 01-Jun-1922 Today's Date: 02/03/2019    History of present illness Pt is a 85 y/o female who sustained a L humerus fx 09/10/18, s/p L ORIF L humeral shaft non union 02/02/19.  PMH: anemia, arthritis, claucoma, HTN, vertigo, spine surgery x 2, B total hip arthroplasty.    OT comments  Returned to see patient when daughter arrived for family education.  Educated continued with patient and initiated with daughter re: precautions, ROM restrictions, weight bearing status, sling mgmt/use, positioning, ice use, and ADL compensatory techniques.  Daughter verbalizes understanding after demonstration. Daughter verbalizes comfort with sling mgmt and use.  Will follow acutely.    Follow Up Recommendations  Home health OT;Supervision/Assistance - 24 hour    Equipment Recommendations  3 in 1 bedside commode    Recommendations for Other Services PT consult    Precautions / Restrictions Precautions Precautions: Shoulder Type of Shoulder Precautions: conservative protocol: no shoulder AROM, okay passive FF to 90, AROM E/W/H  Shoulder Interventions: Shoulder sling/immobilizer;Off for dressing/bathing/exercises;For comfort Precaution Booklet Issued: Yes (comment) Precaution Comments: reviewed precautions with pt and daughter  Required Braces or Orthoses: Sling Restrictions Weight Bearing Restrictions: Yes LUE Weight Bearing: Non weight bearing       Mobility Bed Mobility               General bed mobility comments: OOB in recliner upon entry   Transfers                 General transfer comment: seated in recliner upon entry    Balance                                           ADL either performed or assessed with clinical judgement   ADL Overall ADL's : Needs assistance/impaired         Upper Body Bathing: Sitting;Moderate assistance Upper Body Bathing Details  (indicate cue type and reason): reviewed techniques with daugther      Upper Body Dressing : Sitting;Moderate assistance Upper Body Dressing Details (indicate cue type and reason): reviewed techniques with daughter                   General ADL Comments: reviewed ADL compensatory techniques with daughter     Vision       Perception     Praxis      Cognition Arousal/Alertness: Awake/alert Behavior During Therapy: Anxious Overall Cognitive Status: Within Functional Limits for tasks assessed                                          Exercises Exercises: Hand exercises;Shoulder Shoulder Exercises Shoulder Flexion: PROM;Left;5 reps;Seated(demonstrated to daughter) Shoulder Extension: PROM;Left;5 reps;Seated Elbow Flexion: AAROM;Left;5 reps;Seated Elbow Extension: AAROM;Left;5 reps;Seated Wrist Flexion: AROM;Left;10 reps;Seated Wrist Extension: AROM;Left;10 reps;Seated Digit Composite Flexion: AROM;Left;10 reps;Seated Composite Extension: AROM;Left;10 reps;Seated Hand Exercises Forearm Supination: AROM;Left;10 reps;Seated Forearm Pronation: AROM;Left;10 reps;Seated   Shoulder Instructions Shoulder Instructions Donning/doffing shirt without moving shoulder: Caregiver independent with task Method for sponge bathing under operated UE: Caregiver independent with task Donning/doffing sling/immobilizer: Caregiver independent with task Correct positioning of sling/immobilizer: Caregiver independent with task ROM for elbow, wrist and digits of operated UE: Minimal assistance;Caregiver independent with task Sling wearing  schedule (on at all times/off for ADL's): Caregiver independent with task Proper positioning of operated UE when showering: Caregiver independent with task Positioning of UE while sleeping: Caregiver independent with task     General Comments reviewed precautions, exercises and sling mgmt with daugther    Pertinent Vitals/ Pain       Pain  Assessment: Faces Faces Pain Scale: Hurts little more Pain Location: L UE with ROM Pain Descriptors / Indicators: Discomfort;Grimacing;Operative site guarding Pain Intervention(s): Monitored during session;Repositioned  Home Living                                          Prior Functioning/Environment              Frequency  Min 2X/week        Progress Toward Goals  OT Goals(current goals can now be found in the care plan section)  Progress towards OT goals: Progressing toward goals  Acute Rehab OT Goals Patient Stated Goal: to get home and feel better OT Goal Formulation: With patient  Plan Discharge plan remains appropriate;Frequency remains appropriate    Co-evaluation                 AM-PAC OT "6 Clicks" Daily Activity     Outcome Measure   Help from another person eating meals?: A Little Help from another person taking care of personal grooming?: A Little Help from another person toileting, which includes using toliet, bedpan, or urinal?: A Lot Help from another person bathing (including washing, rinsing, drying)?: A Lot Help from another person to put on and taking off regular upper body clothing?: A Lot Help from another person to put on and taking off regular lower body clothing?: A Lot 6 Click Score: 14    End of Session Equipment Utilized During Treatment: Other (comment)(sling)  OT Visit Diagnosis: Unsteadiness on feet (R26.81);Pain;History of falling (Z91.81);Muscle weakness (generalized) (M62.81) Pain - Right/Left: Left Pain - part of body: Shoulder;Arm   Activity Tolerance Patient tolerated treatment well   Patient Left in chair;with call bell/phone within reach;with family/visitor present;with nursing/sitter in room   Nurse Communication Mobility status        Time: 8891-6945 OT Time Calculation (min): 16 min  Charges: OT General Charges $OT Visit: 1 Visit OT Treatments $Therapeutic Exercise: 8-22  mins  Delight Stare, OT [place] Pager [phone] Office [phone]    Delight Stare 02/03/2019, 1:11 PM

## 2019-02-03 NOTE — Progress Notes (Signed)
Orthopedic Trauma Service Progress Note  Patient ID: Stephanie Martinez MRN: [id number] DOB/AGE: 83-Jun-1923 83 y.o.  Subjective:  Doing great but a little anxious/nervous  Ready to go home  Pain controlled with tylenol  No new issues   No h/a No CP  ROS As above  Objective:   VITALS:   Vitals:   02/02/19 2343 02/03/19 0426 02/03/19 0848 02/03/19 1104  BP: 136/78 111/90 (!) 184/88 (!) 168/88  Pulse: 81 86 83   Resp: 15 13    Temp: 97.6 F (36.4 C) 98.7 F (37.1 C) 98.3 F (36.8 C)   TempSrc:   Oral   SpO2: 94% 94% 97%     Estimated body mass index is 23.14 kg/m as calculated from the following:   Height as of 09/05/18: 5\' 2"  (1.575 m).   Weight as of 09/05/18: 57.4 kg.   Intake/Output      08/14 0701 - 08/15 0700 08/15 0701 - 08/16 0700   P.O.  240   I.V. 1408.2    IV Piggyback 595.8    Total Intake 2004 240   Urine 400    Blood 75    Total Output 475    Net +1529 +240          LABS  No results found for this or any previous visit (from the past 24 hour(s)).   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 02/02/19 4782 02/02/19 1234    .  POD/HD#: 1  95  left-hand-dominant female status post repair of left humerus nonunion  -Left humerus nonunion status post ORIF  Nonweightbearing left upper extremity x 4 weeks   No active abduction for roughly 6 weeks, no resistive activities for 8 weeks  Sling for comfort and on when mobilizing  Ice and elevate as needed for swelling and pain control   Would like for the patient to perform aggressive motion of her elbow forearm wrist and hand as much as possible.  Sling can be off when she is seated or in bed.   Dressing changes to start on 02/05/2019   Okay to shower at that time   Leave open to air once dry   No ointments lotions or solutions are to be applied to the wound.  Clean with soap and water only  - Pain management:  Tylenol is sufficient  She does not want any additional medications  - ABL anemia/Hemodynamics  HTN   Improved blood pressures    No acute findings of  note  - Medical issues   Home medications  - DVT/PE prophylaxis:  Does not require pharmacologic prophylaxis  - ID:   Perioperative antibiotics completed - Metabolic Bone Disease:  Continue vitamin D  - Activity:  As above - FEN/GI prophylaxis/Foley/Lines:  Regular diet   - Impediments to fracture healing:  Osteoporosis  Established nonunion  - Dispo:  Discharge home today  Follow-up with orthopedics in 10 to 14 days     Jari Pigg, PA-C [phone] (C) 02/03/2019, 2:39 PM  [facility] [place] [address] [phone] Domingo Sep (F)

## 2019-02-03 NOTE — Discharge Instructions (Signed)
Orthopaedic Trauma Service Discharge Instructions   General Discharge Instructions  WEIGHT BEARING STATUS: Nonweightbearing left upper extremity  RANGE OF MOTION/ACTIVITY: No active shoulder abduction (chicken wing motion), okay to move shoulder in a forward and back motion.  Shoulder pendulums (clockwise and counterclockwise).  Unrestricted motion of elbow, forearm, wrist and hand.  Sling only needs to be on when ambulating or transferring.  A can be from bed or in the chair.  This is a prime time to work on elbow forearm wrist and hand motion  Bone health: Continue with vitamin D  Wound Care: Wound care to start on 02/05/2019.  Please see below  Discharge Wound Care Instructions  Do NOT apply any ointments, solutions or lotions to pin sites or surgical wounds.  These prevent needed drainage and even though solutions like hydrogen peroxide kill bacteria, they also damage cells lining the pin sites that help fight infection.  Applying lotions or ointments can keep the wounds moist and can cause them to breakdown and open up as well. This can increase the risk for infection. When in doubt call the office.  Can change dressing for a new Mepilex.  If there is no drainage leave it open to the air.  Continue to change Mepilex every 2 to 3 days as long as drainage persists.  Once drainage stops you can leave the wound open to the air.  Please only clean wounds with soap and water.   Diet: as you were eating previously.  Can use over the counter stool softeners and bowel preparations, such as Miralax, to help with bowel movements.  Narcotics can be constipating.  Be sure to drink plenty of fluids  PAIN MEDICATION USE AND EXPECTATIONS  You have likely been given narcotic medications to help control your pain.  After a traumatic event that results in an fracture (broken bone) with or without surgery, it is ok to use narcotic pain medications to help control one's pain.  We understand that  everyone responds to pain differently and each individual patient will be evaluated on a regular basis for the continued need for narcotic medications. Ideally, narcotic medication use should last no more than 6-8 weeks (coinciding with fracture healing).   As a patient it is your responsibility as well to monitor narcotic medication use and report the amount and frequency you use these medications when you come to your office visit.   We would also advise that if you are using narcotic medications, you should take a dose prior to therapy to maximize you participation.  IF YOU ARE ON NARCOTIC MEDICATIONS IT IS NOT PERMISSIBLE TO OPERATE A MOTOR VEHICLE (MOTORCYCLE/CAR/TRUCK/MOPED) OR HEAVY MACHINERY DO NOT MIX NARCOTICS WITH OTHER CNS (CENTRAL NERVOUS SYSTEM) DEPRESSANTS SUCH AS ALCOHOL   STOP SMOKING OR USING NICOTINE PRODUCTS!!!!  As discussed nicotine severely impairs your body's ability to heal surgical and traumatic wounds but also impairs bone healing.  Wounds and bone heal by forming microscopic blood vessels (angiogenesis) and nicotine is a vasoconstrictor (essentially, shrinks blood vessels).  Therefore, if vasoconstriction occurs to these microscopic blood vessels they essentially disappear and are unable to deliver necessary nutrients to the healing tissue.  This is one modifiable factor that you can do to dramatically increase your chances of healing your injury.    (This means no smoking, no nicotine gum, patches, etc)  DO NOT USE NONSTEROIDAL ANTI-INFLAMMATORY DRUGS (NSAID'S)  Using products such as Advil (ibuprofen), Aleve (naproxen), Motrin (ibuprofen) for additional pain control during fracture healing  can delay and/or prevent the healing response.  If you would like to take over the counter (OTC) medication, Tylenol (acetaminophen) is ok.  However, some narcotic medications that are given for pain control contain acetaminophen as well. Therefore, you should not exceed more than 4000 mg  of tylenol in a day if you do not have liver disease.  Also note that there are may OTC medicines, such as cold medicines and allergy medicines that my contain tylenol as well.  If you have any questions about medications and/or interactions please ask your doctor/PA or your pharmacist.      ICE AND ELEVATE INJURED/OPERATIVE EXTREMITY  Using ice and elevating the injured extremity above your heart can help with swelling and pain control.  Icing in a pulsatile fashion, such as 20 minutes on and 20 minutes off, can be followed.    Do not place ice directly on skin. Make sure there is a barrier between to skin and the ice pack.    Using frozen items such as frozen peas works well as the conform nicely to the are that needs to be iced.  USE AN ACE WRAP OR TED HOSE FOR SWELLING CONTROL  In addition to icing and elevation, Ace wraps or TED hose are used to help limit and resolve swelling.  It is recommended to use Ace wraps or TED hose until you are informed to stop.    When using Ace Wraps start the wrapping distally (farthest away from the body) and wrap proximally (closer to the body)   Example: If you had surgery on your leg or thing and you do not have a splint on, start the ace wrap at the toes and work your way up to the thigh        If you had surgery on your upper extremity and do not have a splint on, start the ace wrap at your fingers and work your way up to the upper arm  IF YOU ARE IN A SPLINT OR CAST DO NOT [place]   If your splint gets wet for any reason please contact the office immediately. You may shower in your splint or cast as long as you keep it dry.  This can be done by wrapping in a cast cover or garbage back (or similar)  Do Not stick any thing down your splint or cast such as pencils, money, or hangers to try and scratch yourself with.  If you feel itchy take benadryl as prescribed on the bottle for itching  IF YOU ARE IN A CAM BOOT (BLACK BOOT)  You may remove  boot periodically. Perform daily dressing changes as noted below.  Wash the liner of the boot regularly and wear a sock when wearing the boot. It is recommended that you sleep in the boot until told otherwise  CALL THE OFFICE WITH ANY QUESTIONS OR CONCERNS: [phone]

## 2019-02-05 ENCOUNTER — Telehealth: Payer: Self-pay | Admitting: *Deleted

## 2019-02-05 ENCOUNTER — Telehealth: Payer: Self-pay

## 2019-02-05 NOTE — Telephone Encounter (Signed)
Pt is on TCM list. Admitted on 02/02/2019 and dc'ed on 02/03/2019 after elective surgery for repair of left humeral shaft nonunion and allografting of left humerous nonunion site.   Pt is to follow up with Ortho.

## 2019-02-05 NOTE — Telephone Encounter (Signed)
Patient discharged 02/03/19 after repair of her left humerus nonunion by Dr. Marcelino Scot. Discharge instructions state for her to f/u with Dr. Marcelino Scot.

## 2019-02-07 ENCOUNTER — Encounter (HOSPITAL_COMMUNITY): Payer: Self-pay | Admitting: Orthopedic Surgery

## 2019-02-17 ENCOUNTER — Other Ambulatory Visit: Payer: Self-pay | Admitting: Internal Medicine

## 2019-02-17 DIAGNOSIS — I10 Essential (primary) hypertension: Secondary | ICD-10-CM

## 2019-02-17 DIAGNOSIS — K2100 Gastro-esophageal reflux disease with esophagitis, without bleeding: Secondary | ICD-10-CM

## 2019-02-17 MED ORDER — PANTOPRAZOLE SODIUM 40 MG PO TBEC: 40.0000 mg | DELAYED_RELEASE_TABLET | Freq: Every day | ORAL | 1 refills | Status: DC

## 2019-02-17 MED ORDER — CARVEDILOL 12.5 MG PO TABS: 12.5000 mg | ORAL_TABLET | Freq: Two times a day (BID) | ORAL | 1 refills | Status: DC

## 2019-02-26 NOTE — Op Note (Signed)
02/02/2019  9:03 AM  PATIENT:  Stephanie Martinez  85 y.o. female  PRE-OPERATIVE DIAGNOSIS:  LEFT HUMERUS NONUNION  POST-OPERATIVE DIAGNOSIS:  LEFT HUMERUS NONUNION  PROCEDURE:  Procedure(s): OPEN REDUCTION INTERNAL FIXATION (ORIF) LEFT HUMERAL SHAFT NONUNION (Left)  SURGEON:  Surgeon(s) and Role:    Altamese Mayo, MD - Primary  PHYSICIAN ASSISTANT: Ainsley Spinner, PA-C  ANESTHESIA:   general  EBL:  75 mL   BLOOD ADMINISTERED:none  DRAINS: none   LOCAL MEDICATIONS USED:  NONE  SPECIMEN:  No Specimen  DISPOSITION OF SPECIMEN:  N/A  COUNTS:  YES  TOURNIQUET:  * No tourniquets in log *  DICTATION: .Note written in EPIC  PLAN OF CARE: Admit to inpatient   PATIENT DISPOSITION:  PACU - hemodynamically stable.   Delay start of Pharmacological VTE agent (>24hrs) due to surgical blood loss or risk of bleeding: no  BRIEF SUMMARY AND INDICATIONS FOR PROCEDURE:  Stephanie Martinez is a 50 y.o. who sustained a long oblique left humerus fracture that failed prolonged nonsurgical managemement. I discussed with the patient and her daughter the risks and benefits of surgery, including the possibility of infection, nerve injury, vessel injury, wound breakdown, arthritis, symptomatic hardware, DVT/ PE, loss of motion, malunion, nonunion, and need for further surgery among others.  These risks were acknowledged and consent provided to proceed.  BRIEF SUMMARY OF PROCEDURE:  After administration of preoperative antibiotics, the patient was taken to the OR where general anesthesia was induced.  The left upper extremity was washed with chlorhexidine soap and then a standard Betadine scrub and paint was performed.  This was followed by application of sterile drapes in standard fashion.  A timeout was held.  A standard anterior approach was made through a long incision over the anterolateral aspect of the arm.  Dissection was carried down through the soft tissues carefully to protect the cephalic  vein, which was retracted laterally.  The biceps fascia was incised and the biceps retracted medially.  This exposed the brachialis which was split midline.  We encountered a pseudoarthrosis within the fracture site.  With the help of my assistant, careful retraction allowed for removal of pseudocapsule with use of curettes and lavage at each bone surface.  I was careful to protect adjacent periosteal attachments at all times.  With the help of my assistant, tenaculums were placed  under direct visualization.  The fracture fragments were reduced and then fixed with the Synthes modular foot set screws.  They were 2.4 in diameter.  I overdrilled the near cortex and used a countersink, then secured fixation in the far cortex to achieve  compression.  Two lag screws were placed.  I then brought in a C-arm and on AP and lateral views confirmed appropriate reduction and screw placement.  This was followed by application of a long 3.5 Synthes LC-DCP plate.  I secured 8 cortices of fixation both proximal and distally.  I did use 2 lock screws proximally and 2 distally to augment the fixation.  Ainsley Spinner PA-C assisted me throughout and an assistant was required to protect the neurovascular bundle, to achieve reduction and then during  provisional definitive fixation.  The brachialis was repaired with 0 Vicryl and then 0 Vicryl for the deep subcu, 2-0 Vicryl and 2-0 nylon for the subcuticular layer and skin.  A sterile gentle compressive dressing was applied and then an Ace wrap from  hand to upper arm.    There were no complications during the procedure.  PROGNOSIS:  The patient will have unrestricted passive and active range of motion of the elbow, wrist, and digits. Passive and gentle active assisted motion of the shoulder is encouraged, but no active abduction against resistance (including gravity). May shower in two days and leave wound open to air. Remove sutures at 10-14 days.

## 2019-06-06 ENCOUNTER — Encounter: Payer: Self-pay | Admitting: Family Medicine

## 2019-06-07 IMAGING — MG 2D DIGITAL DIAGNOSTIC BILATERAL MAMMOGRAM WITH CAD AND ADJUNCT T
8 of 15 series · 8 of 35 positions shown · non-contrast
Comparison: Previous exam(s).

CLINICAL DATA: Patient states that she had swelling and erythema in
the 2 o'clock region of the left breast that has resolved. History
of left breast cancer status post lumpectomy in 5220. Patient had a
benign left breast biopsy in 5927.

EXAM:
2D DIGITAL DIAGNOSTIC BILATERAL MAMMOGRAM WITH CAD AND ADJUNCT TOMO
ULTRASOUND LEFT BREAST

[R CC]
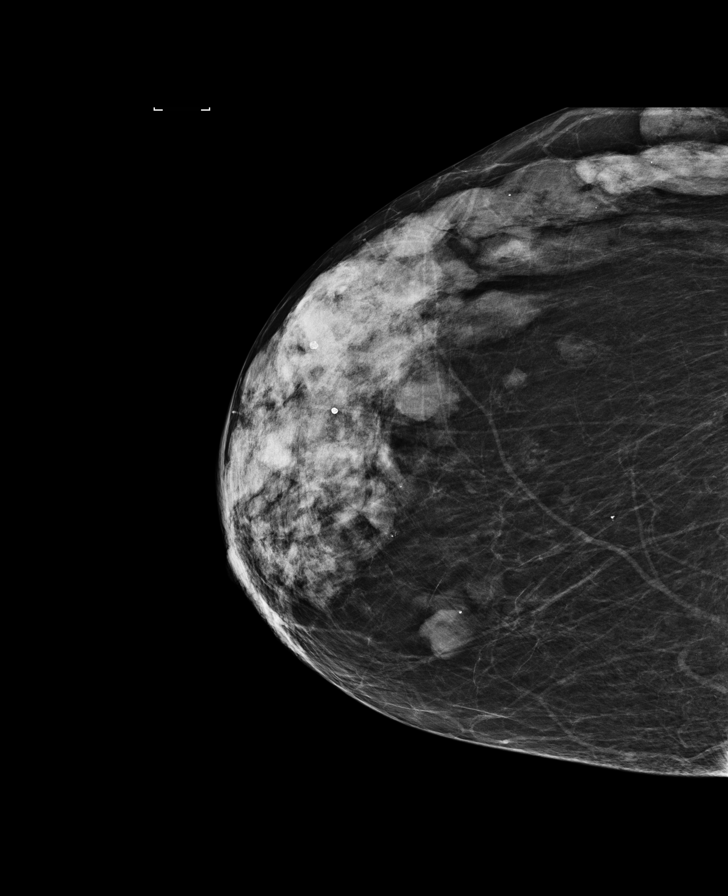

[R MLO]
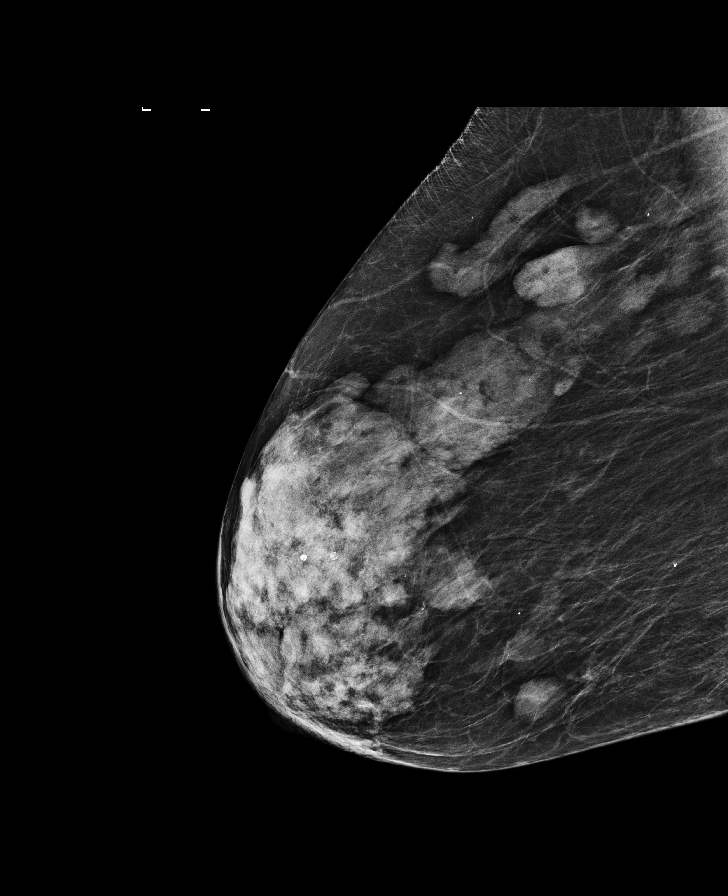

[R MLO synth-2D]
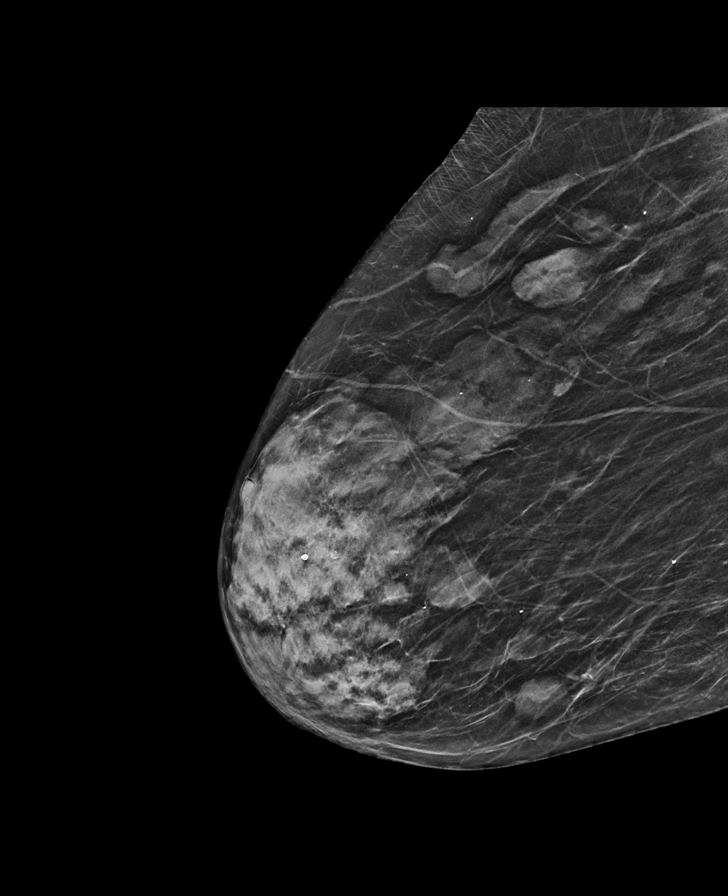

[L CC synth-2D]
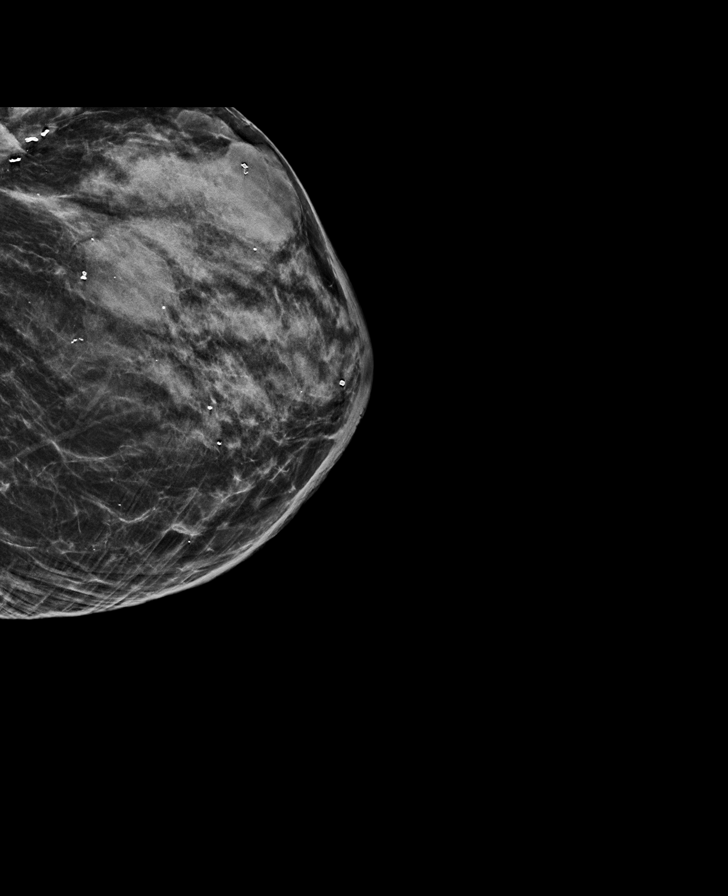

[R CC synth-2D]
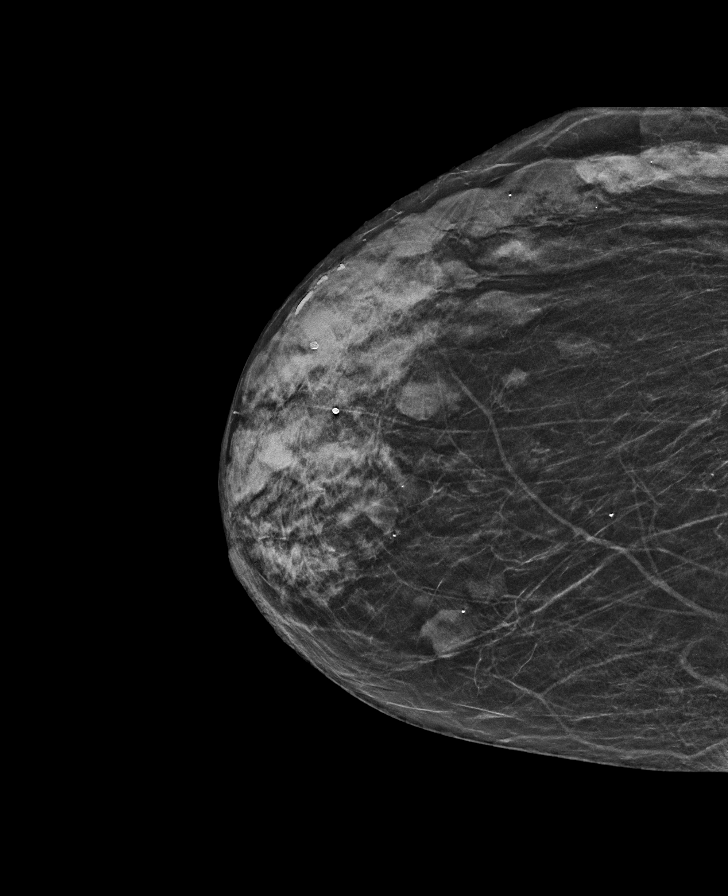

[L CC]
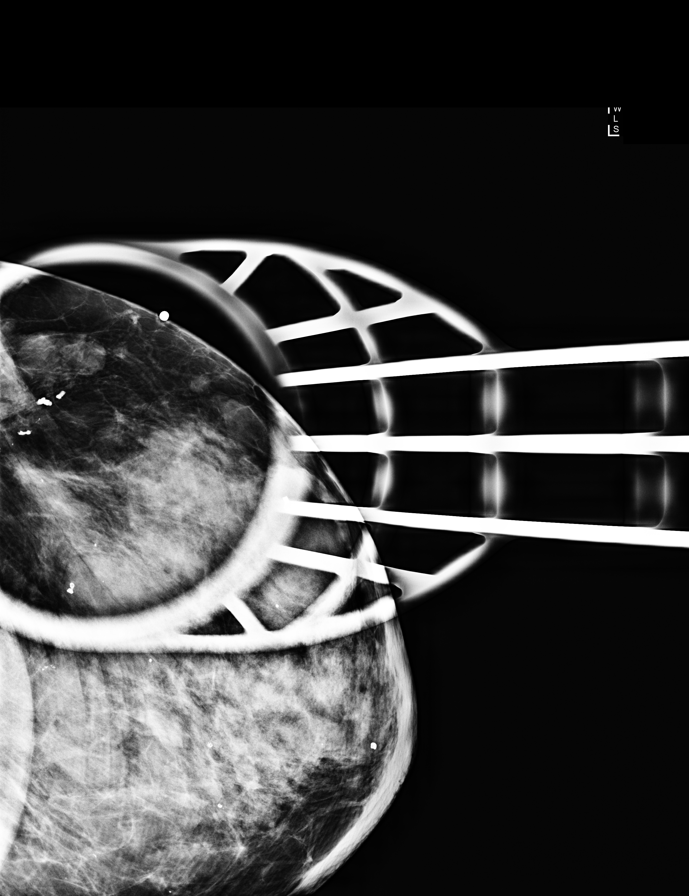

[L MLO synth-2D]
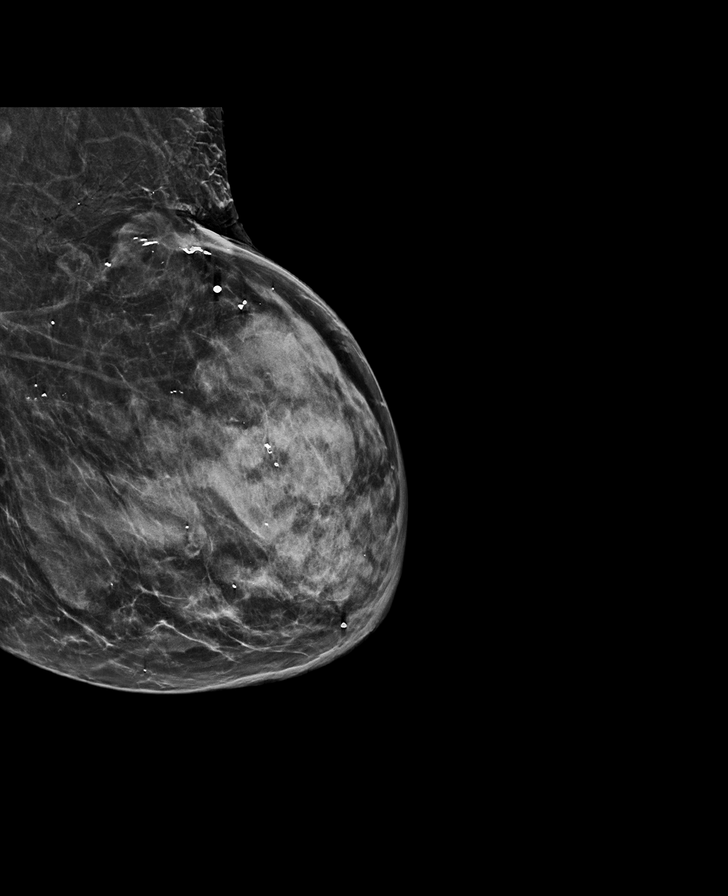

[L MLO]
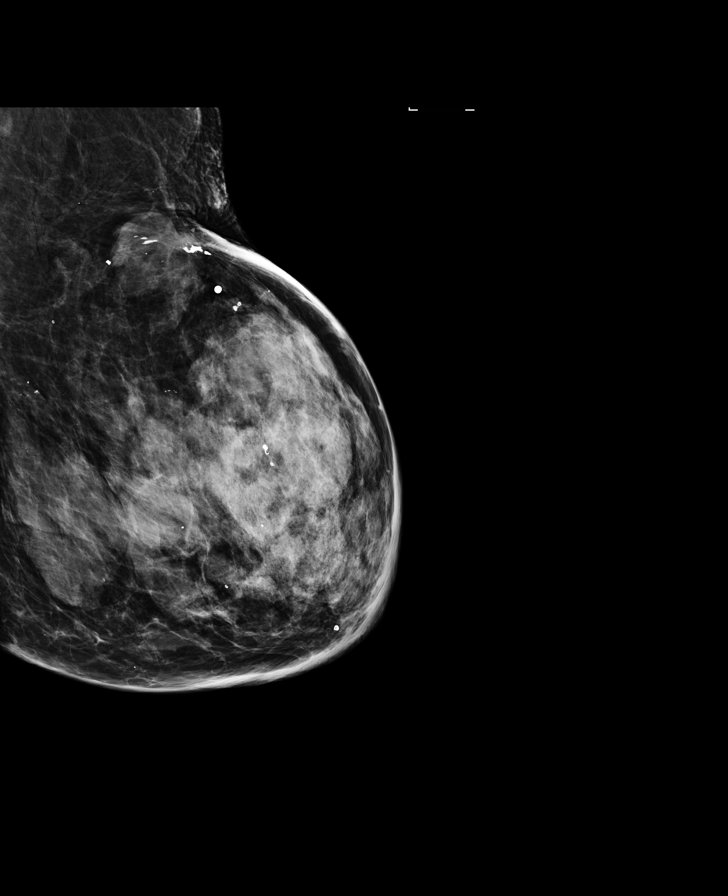

[8 of 35 positions shown; findings below may reference images not displayed]

ACR Breast Density Category c: The breast tissue is heterogeneously
dense, which may obscure small masses.
FINDINGS: No suspicious mass, malignant type microcalcifications or distortion
detected in either breast.

Mammographic images were processed with CAD.

On physical exam, I do not palpate a discrete mass in the 2 o'clock
region of the left breast 10 cm from the nipple. There is no
erythema of the breast.

Targeted ultrasound is performed, showing normal tissue in the left
breast at 2 o'clock 10 cm from the nipple. No solid or cystic mass,
abnormal shadowing or distortion visualized.
IMPRESSION: No evidence of malignancy in either breast.

RECOMMENDATION:
Bilateral screening mammogram in 1 year is recommended.

I have discussed the findings and recommendations with the patient.
Results were also provided in writing at the conclusion of the
visit. If applicable, a reminder letter will be sent to the patient
regarding the next appointment.

BI-RADS CATEGORY  2: Benign.

## 2019-07-01 ENCOUNTER — Other Ambulatory Visit: Payer: Self-pay | Admitting: Internal Medicine

## 2019-07-01 DIAGNOSIS — K2100 Gastro-esophageal reflux disease with esophagitis, without bleeding: Secondary | ICD-10-CM

## 2019-07-06 ENCOUNTER — Other Ambulatory Visit: Payer: Self-pay | Admitting: Family Medicine

## 2019-07-06 DIAGNOSIS — Z1231 Encounter for screening mammogram for malignant neoplasm of breast: Secondary | ICD-10-CM

## 2019-08-13 ENCOUNTER — Other Ambulatory Visit: Payer: Self-pay | Admitting: Internal Medicine

## 2019-08-13 DIAGNOSIS — I10 Essential (primary) hypertension: Secondary | ICD-10-CM

## 2019-08-13 DIAGNOSIS — K2100 Gastro-esophageal reflux disease with esophagitis, without bleeding: Secondary | ICD-10-CM

## 2019-08-15 ENCOUNTER — Ambulatory Visit
Admission: RE | Admit: 2019-08-15 | Discharge: 2019-08-15 | Disposition: A | Discharge status: Home / Self Care | Payer: Medicare Other | Source: Ambulatory Visit | Attending: Family Medicine | Admitting: Family Medicine

## 2019-08-15 ENCOUNTER — Other Ambulatory Visit: Payer: Self-pay

## 2019-08-15 DIAGNOSIS — Z1231 Encounter for screening mammogram for malignant neoplasm of breast: Secondary | ICD-10-CM

## 2019-10-31 ENCOUNTER — Other Ambulatory Visit: Payer: Self-pay | Admitting: Internal Medicine

## 2019-10-31 DIAGNOSIS — K591 Functional diarrhea: Secondary | ICD-10-CM

## 2019-12-03 ENCOUNTER — Telehealth: Payer: Self-pay | Admitting: Internal Medicine

## 2019-12-03 NOTE — Telephone Encounter (Signed)
New message:    1.Medication Requested: Diphenoxylate 2. Pharmacy (Name, [place]): [facility] [place], [address] AT [address] [place] 3. On Med List: No  4. Last Visit with PCP: 09/05/18  5. Next visit date with PCP: none   Agent: Please be advised that RX refills may take up to 3 business days. We ask that you follow-up with your pharmacy.

## 2019-12-03 NOTE — Telephone Encounter (Signed)
Call made to pt dtr - lvm that pt will need an appointment to prescribe a medication that is not on list and patient has not been seen in more than a year.

## 2019-12-05 ENCOUNTER — Other Ambulatory Visit: Payer: Self-pay

## 2019-12-05 ENCOUNTER — Ambulatory Visit (INDEPENDENT_AMBULATORY_CARE_PROVIDER_SITE_OTHER): Payer: Medicare Other | Admitting: Internal Medicine

## 2019-12-05 ENCOUNTER — Encounter: Payer: Self-pay | Admitting: Internal Medicine

## 2019-12-05 VITALS — BP 148/90 | HR 75 | Temp 97.9°F | Resp 16 | Ht 62.0 in | Wt 121.0 lb

## 2019-12-05 DIAGNOSIS — I10 Essential (primary) hypertension: Secondary | ICD-10-CM | POA: Diagnosis not present

## 2019-12-05 DIAGNOSIS — K2100 Gastro-esophageal reflux disease with esophagitis, without bleeding: Secondary | ICD-10-CM

## 2019-12-05 DIAGNOSIS — E7800 Pure hypercholesterolemia, unspecified: Secondary | ICD-10-CM

## 2019-12-05 DIAGNOSIS — N1831 Chronic kidney disease, stage 3a: Secondary | ICD-10-CM

## 2019-12-05 DIAGNOSIS — Z0000 Encounter for general adult medical examination without abnormal findings: Secondary | ICD-10-CM | POA: Diagnosis not present

## 2019-12-05 DIAGNOSIS — E559 Vitamin D deficiency, unspecified: Secondary | ICD-10-CM

## 2019-12-05 DIAGNOSIS — K591 Functional diarrhea: Secondary | ICD-10-CM

## 2019-12-05 DIAGNOSIS — M810 Age-related osteoporosis without current pathological fracture: Secondary | ICD-10-CM

## 2019-12-05 LAB — HEPATIC FUNCTION PANEL
ALT: 8 U/L (ref 0–35)
AST: 15 U/L (ref 0–37)
Albumin: 4 g/dL (ref 3.5–5.2)
Alkaline Phosphatase: 82 U/L (ref 39–117)
Bilirubin, Direct: 0 mg/dL (ref 0.0–0.3)
Total Bilirubin: 0.3 mg/dL (ref 0.2–1.2)
Total Protein: 7.1 g/dL (ref 6.0–8.3)

## 2019-12-05 LAB — CBC WITH DIFFERENTIAL/PLATELET
Basophils Absolute: 0.1 10*3/uL (ref 0.0–0.1)
Basophils Relative: 1.4 % (ref 0.0–3.0)
Eosinophils Absolute: 0.5 10*3/uL (ref 0.0–0.7)
Eosinophils Relative: 6.4 % — ABNORMAL HIGH (ref 0.0–5.0)
HCT: 33.8 % — ABNORMAL LOW (ref 36.0–46.0)
Hemoglobin: 11.2 g/dL — ABNORMAL LOW (ref 12.0–15.0)
Lymphocytes Relative: 17.3 % (ref 12.0–46.0)
Lymphs Abs: 1.3 10*3/uL (ref 0.7–4.0)
MCHC: 33.2 g/dL (ref 30.0–36.0)
MCV: 90.8 fl (ref 78.0–100.0)
Monocytes Absolute: 0.5 10*3/uL (ref 0.1–1.0)
Monocytes Relative: 7 % (ref 3.0–12.0)
Neutro Abs: 4.9 10*3/uL (ref 1.4–7.7)
Neutrophils Relative %: 67.9 % (ref 43.0–77.0)
Platelets: 289 10*3/uL (ref 150.0–400.0)
RBC: 3.72 Mil/uL — ABNORMAL LOW (ref 3.87–5.11)
RDW: 13.6 % (ref 11.5–15.5)
WBC: 7.3 10*3/uL (ref 4.0–10.5)

## 2019-12-05 LAB — BASIC METABOLIC PANEL
BUN: 39 mg/dL — ABNORMAL HIGH (ref 6–23)
CO2: 26 mEq/L (ref 19–32)
Calcium: 9.6 mg/dL (ref 8.4–10.5)
Chloride: 103 mEq/L (ref 96–112)
Creatinine, Ser: 1.83 mg/dL — ABNORMAL HIGH (ref 0.40–1.20)
GFR: 25.59 mL/min — ABNORMAL LOW (ref >60.00)
Glucose, Bld: 91 mg/dL (ref 70–99)
Potassium: 4.2 mEq/L (ref 3.5–5.1)
Sodium: 137 mEq/L (ref 135–145)

## 2019-12-05 LAB — VITAMIN D 25 HYDROXY (VIT D DEFICIENCY, FRACTURES): VITD: 67.65 ng/mL (ref 30.00–100.00)

## 2019-12-05 LAB — LDL CHOLESTEROL, DIRECT: Direct LDL: 107 mg/dL

## 2019-12-05 LAB — LIPID PANEL
Cholesterol: 193 mg/dL (ref 0–200)
HDL: 39.7 mg/dL (ref >39.00)
NonHDL: 152.82
Total CHOL/HDL Ratio: 5
Triglycerides: 202 mg/dL — ABNORMAL HIGH (ref 0.0–149.0)
VLDL: 40.4 mg/dL — ABNORMAL HIGH (ref 0.0–40.0)

## 2019-12-05 LAB — TSH: TSH: 2.02 u[IU]/mL (ref 0.35–4.50)

## 2019-12-05 MED ORDER — VIBERZI 75 MG PO TABS: 1.0000 | ORAL_TABLET | Freq: Every day | ORAL | 0 refills | Status: DC

## 2019-12-05 MED ORDER — FAMOTIDINE 20 MG PO TABS: 20.0000 mg | ORAL_TABLET | Freq: Two times a day (BID) | ORAL | 1 refills | Status: DC

## 2019-12-05 NOTE — Patient Instructions (Signed)

## 2019-12-05 NOTE — Progress Notes (Signed)
Subjective:  Patient ID: Stephanie Martinez, female    DOB: 04/17/1922  Age: 86 y.o. MRN: [id number]  CC: Annual Exam and Hypertension  This visit occurred during the SARS-CoV-2 public health emergency.  Safety protocols were in place, including screening questions prior to the visit, additional usage of staff PPE, and extensive cleaning of exam room while observing appropriate contact time as indicated for disinfecting solutions.    HPI Stephanie Martinez presents for a CPX.  She continues to complain of diarrhea.  She has had diarrhea for more than a year.  She describes about 3-4 loose, watery stools a day.  She never sees blood, mucus, or melena.  She has rare cramping.  She denies nausea, vomiting, loss of appetite, or weight loss.  She is taking a PPI and a magnesium supplement.  She is also taking vitamin C.  She does get symptom relief when she takes Lomotil.  She has fear of developing constipation so she does not take Lomotil very frequently.   Outpatient Medications Prior to Visit  Medication Sig Dispense Refill  . acetaminophen (TYLENOL) 500 MG tablet Take 500 mg by mouth every 6 (six) hours as needed (pain.).     Marland Kitchen carvedilol (COREG) 12.5 MG tablet Take 1 tablet (12.5 mg total) by mouth 2 (two) times daily. 180 tablet 1  . latanoprost (XALATAN) 0.005 % ophthalmic solution Place 1 drop into both eyes at bedtime.     . Lutein 20 MG CAPS Take 20 mg by mouth daily before lunch.    . vitamin B-12 (CYANOCOBALAMIN) 1000 MCG tablet Take 1,000 mcg by mouth daily before lunch.    . Calcium-Magnesium-Zinc (CAL-MAG-ZINC PO) Take 1 tablet by mouth daily before lunch.    . Cholecalciferol (VITAMIN D3) 50 MCG (2000 UT) TABS Take 2,000 Units by mouth daily before lunch.    . diphenhydramine-acetaminophen (TYLENOL PM) 25-500 MG TABS tablet Take 2 tablets by mouth at bedtime.    . pantoprazole (PROTONIX) 40 MG tablet TAKE 1 TABLET(40 MG) BY MOUTH EVERY DAY 30 TO 60 MINUTES BEFORE FIRST MEAL OF  THE DAY 90 tablet 1   No facility-administered medications prior to visit.    ROS Review of Systems  Constitutional: Negative.  Negative for appetite change, chills, fatigue and fever.  HENT: Negative.  Negative for trouble swallowing.   Eyes: Negative for visual disturbance.  Respiratory: Negative for cough, chest tightness, shortness of breath and wheezing.   Cardiovascular: Negative for chest pain, palpitations and leg swelling.  Gastrointestinal: Positive for abdominal pain and diarrhea. Negative for constipation, nausea and vomiting.  Endocrine: Negative.   Genitourinary: Negative.  Negative for difficulty urinating and dyspareunia.  Musculoskeletal: Negative.  Negative for arthralgias and myalgias.  Skin: Negative for color change, pallor and rash.  Neurological: Negative.  Negative for dizziness, weakness, light-headedness and headaches.  Hematological: Negative for adenopathy. Does not bruise/bleed easily.  Psychiatric/Behavioral: Negative.     Objective:  BP (!) 148/90 (BP Location: Left Arm, Patient Position: Sitting, Cuff Size: Normal)   Pulse 75   Temp 97.9 F (36.6 C) (Oral)   Resp 16   Ht 5\' 2"  (1.575 m)   Wt 121 lb (54.9 kg)   SpO2 96%   BMI 22.13 kg/m   BP Readings from Last 3 Encounters:  12/05/19 (!) 148/90  02/03/19 (!) 168/88  09/10/18 (!) 157/70    Wt Readings from Last 3 Encounters:  12/05/19 121 lb (54.9 kg)  09/05/18 126 lb 8 oz (57.4 kg)  10/10/17 129 lb (58.5 kg)    Physical Exam Vitals reviewed.  Constitutional:      Appearance: Normal appearance.  HENT:     Nose: Nose normal.     Mouth/Throat:     Mouth: Mucous membranes are moist.  Eyes:     General: No scleral icterus.    Conjunctiva/sclera: Conjunctivae normal.  Cardiovascular:     Rate and Rhythm: Normal rate and regular rhythm.     Heart sounds: No murmur heard.   Pulmonary:     Effort: Pulmonary effort is normal.     Breath sounds: No stridor. No wheezing, rhonchi or  rales.  Abdominal:     General: Abdomen is flat. Bowel sounds are normal. There is no distension.     Palpations: Abdomen is soft. There is no hepatomegaly, splenomegaly or mass.     Tenderness: There is no abdominal tenderness.  Musculoskeletal:        General: Normal range of motion.     Cervical back: Neck supple.     Right lower leg: No edema.     Left lower leg: No edema.  Lymphadenopathy:     Cervical: No cervical adenopathy.  Skin:    General: Skin is warm and dry.     Coloration: Skin is not pale.  Neurological:     General: No focal deficit present.     Mental Status: She is alert and oriented to person, place, and time. Mental status is at baseline.  Psychiatric:        Mood and Affect: Mood normal.        Behavior: Behavior normal.        Thought Content: Thought content normal.        Judgment: Judgment normal.     Lab Results  Component Value Date   WBC 7.3 12/05/2019   HGB 11.2 (L) 12/05/2019   HCT 33.8 (L) 12/05/2019   PLT 289.0 12/05/2019   GLUCOSE 91 12/05/2019   CHOL 193 12/05/2019   TRIG 202.0 (H) 12/05/2019   HDL 39.70 12/05/2019   LDLDIRECT 107.0 12/05/2019   LDLCALC 102 (H) 11/26/2014   ALT 8 12/05/2019   AST 15 12/05/2019   NA 137 12/05/2019   K 4.2 12/05/2019   CL 103 12/05/2019   CREATININE 1.83 (H) 12/05/2019   BUN 39 (H) 12/05/2019   CO2 26 12/05/2019   TSH 2.02 12/05/2019   INR 1.0 02/02/2019   HGBA1C 5.7 09/18/2012    MM 3D SCREEN BREAST BILATERAL  Result Date: 08/15/2019 CLINICAL DATA:  Screening. EXAM: DIGITAL SCREENING BILATERAL MAMMOGRAM WITH TOMO AND CAD COMPARISON:  Previous exam(s). ACR Breast Density Category c: The breast tissue is heterogeneously dense, which may obscure small masses. FINDINGS: There are no findings suspicious for malignancy. Images were processed with CAD. IMPRESSION: No mammographic evidence of malignancy. A result letter of this screening mammogram will be mailed directly to the patient. RECOMMENDATION:  Screening mammogram in one year. (Code:SM-B-01Y) BI-RADS CATEGORY  1: Negative. Electronically Signed   By: Nolon Nations M.D.   On: 08/15/2019 16:05    Assessment & Plan:   Stephanie Martinez was seen today for annual exam and hypertension.  Diagnoses and all orders for this visit:  Essential hypertension, benign- Her blood pressure is adequately well controlled. -     TSH; Future -     CBC with Differential/Platelet; Future -     Basic metabolic panel; Future -     Basic metabolic panel -  CBC with Differential/Platelet -     TSH  Functional diarrhea- She has developed a mild anemia so of asked her to return to be screened for vitamin deficiencies.  Will screen for celiac disease.  I am concerned she has IBS-D so I have asked her to take a once daily dose of Viberzi. -     Gliadin antibodies, serum; Future -     Tissue transglutaminase, IgA; Future -     Reticulin Antibody, IgA w reflex titer; Future -     TSH; Future -     Eluxadoline (VIBERZI) 75 MG TABS; Take 1 tablet by mouth daily. -     TSH -     Reticulin Antibody, IgA w reflex titer -     Tissue transglutaminase, IgA -     Gliadin antibodies, serum  Gastroesophageal reflux disease with esophagitis without hemorrhage- I am concerned the PPI may be contributing to the diarrhea so I have asked her to switch to an H2 blocker. -     famotidine (PEPCID) 20 MG tablet; Take 1 tablet (20 mg total) by mouth 2 (two) times daily.  Stage 3a chronic kidney disease- Her renal function is stable.  Her blood pressure is adequately well controlled.  She agrees to avoid nephrotoxic agents. -     Basic metabolic panel; Future -     Basic metabolic panel  Age-related osteoporosis without current pathological fracture  Vitamin D deficiency- Her vitamin D level is in the upper normal range at 68.  I recommended that she not take a vitamin D supplement for now. -     VITAMIN D 25 Hydroxy (Vit-D Deficiency, Fractures); Future -     VITAMIN D 25  Hydroxy (Vit-D Deficiency, Fractures)  Routine general medical examination at a health care facility- Exam completed, labs reviewed, vaccines reviewed and updated, no cancer screenings are indicated, patient education material was given.  Pure hypercholesterolemia- Statin therapy is not indicated. -     Lipid panel; Future -     Hepatic function panel; Future -     Hepatic function panel -     Lipid panel  Other orders -     LDL cholesterol, direct   I have discontinued Barnetta Chapel A. Lamagna's Calcium-Magnesium-Zinc (CAL-MAG-ZINC PO), Vitamin D3, diphenhydramine-acetaminophen, and pantoprazole. I am also having her start on famotidine and Viberzi. Additionally, I am having her maintain her latanoprost, acetaminophen, Lutein, vitamin B-12, and carvedilol.  Meds ordered this encounter  Medications  . famotidine (PEPCID) 20 MG tablet    Sig: Take 1 tablet (20 mg total) by mouth 2 (two) times daily.    Dispense:  180 tablet    Refill:  1  . Eluxadoline (VIBERZI) 75 MG TABS    Sig: Take 1 tablet by mouth daily.    Dispense:  16 tablet    Refill:  0   In addition to time spent on CPE, I spent 50 minutes in preparing to see the patient by review of recent labs, imaging and procedures, obtaining and reviewing separately obtained history, communicating with the patient and family or caregiver, ordering medications, tests or procedures, and documenting clinical information in the EHR including the differential Dx, treatment, and any further evaluation and other management of 1. Essential hypertension, benign 2. Functional diarrhea 3. Gastroesophageal reflux disease with esophagitis without hemorrhage 4. Stage 3a chronic kidney disease 5. Age-related osteoporosis without current pathological fracture 6. Vitamin D deficiency 7. Pure hypercholesterolemia     Follow-up: Return in about  3 months (around 03/06/2020).  Scarlette Calico, MD

## 2019-12-08 LAB — RETICULIN ANTIBODIES, IGA W TITER: Reticulin IGA Screen: NEGATIVE

## 2019-12-08 LAB — TISSUE TRANSGLUTAMINASE, IGA: (tTG) Ab, IgA: 1 U/mL

## 2019-12-08 LAB — GLIADIN ANTIBODIES, SERUM
Gliadin IgA: 9 Units
Gliadin IgG: 1 Units

## 2019-12-13 ENCOUNTER — Encounter: Payer: Self-pay | Admitting: Internal Medicine

## 2019-12-13 ENCOUNTER — Other Ambulatory Visit: Payer: Self-pay

## 2019-12-13 ENCOUNTER — Ambulatory Visit (INDEPENDENT_AMBULATORY_CARE_PROVIDER_SITE_OTHER): Payer: Medicare Other | Admitting: Internal Medicine

## 2019-12-13 VITALS — BP 132/80 | HR 73 | Temp 98.2°F | Resp 16 | Ht 62.0 in | Wt 121.0 lb

## 2019-12-13 DIAGNOSIS — K580 Irritable bowel syndrome with diarrhea: Secondary | ICD-10-CM

## 2019-12-13 DIAGNOSIS — D500 Iron deficiency anemia secondary to blood loss (chronic): Secondary | ICD-10-CM

## 2019-12-13 DIAGNOSIS — D539 Nutritional anemia, unspecified: Secondary | ICD-10-CM | POA: Diagnosis not present

## 2019-12-13 LAB — CBC WITH DIFFERENTIAL/PLATELET
Basophils Absolute: 0.1 10*3/uL (ref 0.0–0.1)
Basophils Relative: 1.1 % (ref 0.0–3.0)
Eosinophils Absolute: 0.3 10*3/uL (ref 0.0–0.7)
Eosinophils Relative: 3.9 % (ref 0.0–5.0)
HCT: 34.2 % — ABNORMAL LOW (ref 36.0–46.0)
Hemoglobin: 11.5 g/dL — ABNORMAL LOW (ref 12.0–15.0)
Lymphocytes Relative: 15.1 % (ref 12.0–46.0)
Lymphs Abs: 1.1 10*3/uL (ref 0.7–4.0)
MCHC: 33.5 g/dL (ref 30.0–36.0)
MCV: 90.8 fl (ref 78.0–100.0)
Monocytes Absolute: 0.5 10*3/uL (ref 0.1–1.0)
Monocytes Relative: 7.1 % (ref 3.0–12.0)
Neutro Abs: 5.4 10*3/uL (ref 1.4–7.7)
Neutrophils Relative %: 72.8 % (ref 43.0–77.0)
Platelets: 251 10*3/uL (ref 150.0–400.0)
RBC: 3.76 Mil/uL — ABNORMAL LOW (ref 3.87–5.11)
RDW: 13.6 % (ref 11.5–15.5)
WBC: 7.4 10*3/uL (ref 4.0–10.5)

## 2019-12-13 LAB — FOLATE: Folate: 10.8 ng/mL (ref >5.9)

## 2019-12-13 LAB — FERRITIN: Ferritin: 19.6 ng/mL (ref 10.0–291.0)

## 2019-12-13 LAB — VITAMIN B12: Vitamin B-12: >1526 pg/mL — ABNORMAL HIGH (ref 211–911)

## 2019-12-13 MED ORDER — RIFAXIMIN 550 MG PO TABS: 550.0000 mg | ORAL_TABLET | Freq: Three times a day (TID) | ORAL | 0 refills | Status: DC

## 2019-12-13 NOTE — Patient Instructions (Signed)
Anemia  Anemia is a condition in which you do not have enough red blood cells or hemoglobin. Hemoglobin is a substance in red blood cells that carries oxygen. When you do not have enough red blood cells or hemoglobin (are anemic), your body cannot get enough oxygen and your organs may not work properly. As a result, you may feel very tired or have other problems. What are the causes? Common causes of anemia include:  Excessive bleeding. Anemia can be caused by excessive bleeding inside or outside the body, including bleeding from the intestine or from periods in women.  Poor nutrition.  Long-lasting (chronic) kidney, thyroid, and liver disease.  Bone marrow disorders.  Cancer and treatments for cancer.  HIV (human immunodeficiency virus) and AIDS (acquired immunodeficiency syndrome).  Treatments for HIV and AIDS.  Spleen problems.  Blood disorders.  Infections, medicines, and autoimmune disorders that destroy red blood cells. What are the signs or symptoms? Symptoms of this condition include:  Minor weakness.  Dizziness.  Headache.  Feeling heartbeats that are irregular or faster than normal (palpitations).  Shortness of breath, especially with exercise.  Paleness.  Cold sensitivity.  Indigestion.  Nausea.  Difficulty sleeping.  Difficulty concentrating. Symptoms may occur suddenly or develop slowly. If your anemia is mild, you may not have symptoms. How is this diagnosed? This condition is diagnosed based on:  Blood tests.  Your medical history.  A physical exam.  Bone marrow biopsy. Your health care provider may also check your stool (feces) for blood and may do additional testing to look for the cause of your bleeding. You may also have other tests, including:  Imaging tests, such as a CT scan or MRI.  Endoscopy.  Colonoscopy. How is this treated? Treatment for this condition depends on the cause. If you continue to lose a lot of blood, you may  need to be treated at a hospital. Treatment may include:  Taking supplements of iron, vitamin S31, or folic acid.  Taking a hormone medicine (erythropoietin) that can help to stimulate red blood cell growth.  Having a blood transfusion. This may be needed if you lose a lot of blood.  Making changes to your diet.  Having surgery to remove your spleen. Follow these instructions at home:  Take over-the-counter and prescription medicines only as told by your health care provider.  Take supplements only as told by your health care provider.  Follow any diet instructions that you were given.  Keep all follow-up visits as told by your health care provider. This is important. Contact a health care provider if:  You develop new bleeding anywhere in the body. Get help right away if:  You are very weak.  You are short of breath.  You have pain in your abdomen or chest.  You are dizzy or feel faint.  You have trouble concentrating.  You have bloody or black, tarry stools.  You vomit repeatedly or you vomit up blood. Summary  Anemia is a condition in which you do not have enough red blood cells or enough of a substance in your red blood cells that carries oxygen (hemoglobin).  Symptoms may occur suddenly or develop slowly.  If your anemia is mild, you may not have symptoms.  This condition is diagnosed with blood tests as well as a medical history and physical exam. Other tests may be needed.  Treatment for this condition depends on the cause of the anemia. This information is not intended to replace advice given to you by  your health care provider. Make sure you discuss any questions you have with your health care provider. Document Revised: 05/20/2017 Document Reviewed: 07/09/2016 Elsevier Patient Education  [place].

## 2019-12-13 NOTE — Progress Notes (Signed)
Subjective:  Patient ID: Stephanie Martinez, female    DOB: [birth year]  Age: 86 y.o. MRN: [id number]  CC: Anemia  This visit occurred during the SARS-CoV-2 public health emergency.  Safety protocols were in place, including screening questions prior to the visit, additional usage of staff PPE, and extensive cleaning of exam room while observing appropriate contact time as indicated for disinfecting solutions.    HPI Stephanie Martinez presents for f/up - She suffers from chronic diarrhea.  She recently tried Viberzi but it caused constipation.  The diarrhea continues.  Her recent labs showed that she is anemic.  Outpatient Medications Prior to Visit  Medication Sig Dispense Refill  . acetaminophen (TYLENOL) 500 MG tablet Take 500 mg by mouth every 6 (six) hours as needed (pain.).     Marland Kitchen carvedilol (COREG) 12.5 MG tablet Take 1 tablet (12.5 mg total) by mouth 2 (two) times daily. 180 tablet 1  . Eluxadoline (VIBERZI) 75 MG TABS Take 1 tablet by mouth daily. 16 tablet 0  . famotidine (PEPCID) 20 MG tablet Take 1 tablet (20 mg total) by mouth 2 (two) times daily. 180 tablet 1  . latanoprost (XALATAN) 0.005 % ophthalmic solution Place 1 drop into both eyes at bedtime.     . Lutein 20 MG CAPS Take 20 mg by mouth daily before lunch.    . vitamin B-12 (CYANOCOBALAMIN) 1000 MCG tablet Take 1,000 mcg by mouth daily before lunch.     No facility-administered medications prior to visit.    ROS Review of Systems  Constitutional: Positive for fatigue. Negative for appetite change, diaphoresis and unexpected weight change.  HENT: Negative.  Negative for trouble swallowing.   Respiratory: Positive for shortness of breath. Negative for cough, chest tightness and wheezing.   Cardiovascular: Negative for chest pain, palpitations and leg swelling.  Gastrointestinal: Positive for diarrhea. Negative for abdominal pain, anal bleeding, blood in stool, nausea and vomiting.  Genitourinary: Negative.   Negative for difficulty urinating.  Musculoskeletal: Negative for arthralgias and myalgias.  Skin: Negative.  Negative for color change, pallor and rash.  Neurological: Negative.  Negative for dizziness, weakness, light-headedness and numbness.  Hematological: Negative for adenopathy. Does not bruise/bleed easily.  Psychiatric/Behavioral: Negative.     Objective:  BP 132/80 (BP Location: Left Arm, Patient Position: Sitting, Cuff Size: Normal)   Pulse 73   Temp 98.2 F (36.8 C) (Oral)   Resp 16   Ht 5\' 2"  (1.575 m)   Wt 121 lb (54.9 kg)   SpO2 98%   BMI 22.13 kg/m   BP Readings from Last 3 Encounters:  12/13/19 132/80  12/05/19 (!) 148/90  02/03/19 (!) 168/88    Wt Readings from Last 3 Encounters:  12/13/19 121 lb (54.9 kg)  12/05/19 121 lb (54.9 kg)  09/05/18 126 lb 8 oz (57.4 kg)    Physical Exam Vitals reviewed.  Constitutional:      Appearance: Normal appearance. She is not diaphoretic.  HENT:     Nose: Nose normal.     Mouth/Throat:     Mouth: Mucous membranes are moist.  Eyes:     General: No scleral icterus.    Conjunctiva/sclera: Conjunctivae normal.  Cardiovascular:     Rate and Rhythm: Normal rate and regular rhythm.     Heart sounds: No murmur heard.   Pulmonary:     Effort: Pulmonary effort is normal.     Breath sounds: No stridor. No wheezing, rhonchi or rales.  Abdominal:  General: Abdomen is flat. Bowel sounds are normal. There is no distension.     Palpations: Abdomen is soft. There is no hepatomegaly, splenomegaly or mass.     Tenderness: There is no abdominal tenderness.  Musculoskeletal:        General: Normal range of motion.     Cervical back: Neck supple.     Right lower leg: No edema.     Left lower leg: No edema.  Lymphadenopathy:     Cervical: No cervical adenopathy.  Skin:    General: Skin is warm and dry.     Coloration: Skin is pale.  Neurological:     General: No focal deficit present.     Mental Status: She is alert.    Psychiatric:        Mood and Affect: Mood normal.        Behavior: Behavior normal.     Lab Results  Component Value Date   WBC 7.4 12/13/2019   HGB 11.5 (L) 12/13/2019   HCT 34.2 (L) 12/13/2019   PLT 251.0 12/13/2019   GLUCOSE 91 12/05/2019   CHOL 193 12/05/2019   TRIG 202.0 (H) 12/05/2019   HDL 39.70 12/05/2019   LDLDIRECT 107.0 12/05/2019   LDLCALC 102 (H) 11/26/2014   ALT 8 12/05/2019   AST 15 12/05/2019   NA 137 12/05/2019   K 4.2 12/05/2019   CL 103 12/05/2019   CREATININE 1.83 (H) 12/05/2019   BUN 39 (H) 12/05/2019   CO2 26 12/05/2019   TSH 2.02 12/05/2019   INR 1.0 02/02/2019   HGBA1C 5.7 09/18/2012    MM 3D SCREEN BREAST BILATERAL  Result Date: 08/15/2019 CLINICAL DATA:  Screening. EXAM: DIGITAL SCREENING BILATERAL MAMMOGRAM WITH TOMO AND CAD COMPARISON:  Previous exam(s). ACR Breast Density Category c: The breast tissue is heterogeneously dense, which may obscure small masses. FINDINGS: There are no findings suspicious for malignancy. Images were processed with CAD. IMPRESSION: No mammographic evidence of malignancy. A result letter of this screening mammogram will be mailed directly to the patient. RECOMMENDATION: Screening mammogram in one year. (Code:SM-B-01Y) BI-RADS CATEGORY  1: Negative. Electronically Signed   By: Nolon Nations M.D.   On: 08/15/2019 16:05    Assessment & Plan:   Lysha was seen today for anemia.  Diagnoses and all orders for this visit:  Deficiency anemia- She has iron deficiency anemia.  She is symptomatic and suffers from malabsorption.  I have therefore recommended that she receive a series of iron infusions. -     Vitamin B12; Future -     CBC with Differential/Platelet; Future -     IBC panel; Future -     Folate; Future -     Ferritin; Future -     Reticulocytes; Future -     Vitamin B1; Future -     Vitamin B1 -     Reticulocytes -     Ferritin -     Folate -     IBC panel -     CBC with Differential/Platelet -      Vitamin B12  Irritable bowel syndrome with diarrhea- I have asked her to try a 14-day course of rifaximin to treat this. -     rifaximin (XIFAXAN) 550 MG TABS tablet; Take 1 tablet (550 mg total) by mouth 3 (three) times daily for 14 days.  Iron deficiency anemia due to chronic blood loss- See above.   I have discontinued Barnetta Chapel A. Spurling's vitamin B-12. I am also  having her start on rifaximin. Additionally, I am having her maintain her latanoprost, acetaminophen, Lutein, carvedilol, famotidine, and Viberzi.  Meds ordered this encounter  Medications  . rifaximin (XIFAXAN) 550 MG TABS tablet    Sig: Take 1 tablet (550 mg total) by mouth 3 (three) times daily for 14 days.    Dispense:  42 tablet    Refill:  0     Follow-up: Return in about 6 months (around 06/13/2020).  Scarlette Calico, MD

## 2019-12-14 LAB — IBC PANEL
Iron: 37 ug/dL — ABNORMAL LOW (ref 42–145)
Saturation Ratios: 11.2 % — ABNORMAL LOW (ref 20.0–50.0)
Transferrin: 236 mg/dL (ref 212.0–360.0)

## 2019-12-18 DIAGNOSIS — D500 Iron deficiency anemia secondary to blood loss (chronic): Secondary | ICD-10-CM | POA: Insufficient documentation

## 2019-12-18 LAB — VITAMIN B1: Vitamin B1 (Thiamine): 15 nmol/L (ref 8–30)

## 2019-12-18 LAB — RETICULOCYTES
ABS Retic: 45360 cells/uL (ref 20000–8000)
Retic Ct Pct: 1.2 %

## 2019-12-18 NOTE — Telephone Encounter (Signed)
F/u  [employer] calling  prior authorization is approved rifaximin (XIFAXAN) 550 MG TABS tablet 6.29.21 to  6.29.22

## 2019-12-19 ENCOUNTER — Telehealth: Payer: Self-pay

## 2019-12-19 NOTE — Telephone Encounter (Signed)
Key: [id number]  Per Note from Neil Crouch: F/u  [facility] calling  prior authorization is approved rifaximin (XIFAXAN) 550 MG TABS tablet 6.29.21 to  6.29.22

## 2019-12-19 NOTE — Telephone Encounter (Signed)
Called Izora Gala (pt dtr) and informed of same.

## 2019-12-21 ENCOUNTER — Other Ambulatory Visit: Payer: Self-pay

## 2019-12-21 ENCOUNTER — Telehealth: Payer: Self-pay

## 2019-12-21 ENCOUNTER — Ambulatory Visit (HOSPITAL_COMMUNITY)
Admission: RE | Admit: 2019-12-21 | Discharge: 2019-12-21 | Disposition: A | Discharge status: Home / Self Care | Payer: Medicare Other | Source: Ambulatory Visit | Attending: Internal Medicine | Admitting: Internal Medicine

## 2019-12-21 DIAGNOSIS — D500 Iron deficiency anemia secondary to blood loss (chronic): Secondary | ICD-10-CM | POA: Insufficient documentation

## 2019-12-21 MED ORDER — SODIUM CHLORIDE 0.9 % IV SOLN: INTRAVENOUS | Status: DC | PRN

## 2019-12-21 MED ORDER — SODIUM CHLORIDE 0.9 % IV SOLN
750.0000 mg | Freq: Once | INTRAVENOUS | Status: DC
  Filled 2019-12-21: qty 15

## 2019-12-21 NOTE — Telephone Encounter (Signed)
RN from [facility] calling to inform MD that pt BP 176/113 (lowest after being taken multiple times).  Pt admits that she has not taken her antihypertensive today & that her BP is normally high.  RN calling prior to scheduled iron infusion for orders to proceed with infusion in light of persistent hypertension this am.  Discussed situation with Mindi Slicker NP since Dr Ronnald Ramp not in office. NP reviewed pt chart & orders rec'd to not proceed with infusion & for pt to schedule appt with Dr Ronnald Ramp for BP management.  [facility] RN verb understanding.

## 2019-12-21 NOTE — Progress Notes (Signed)
Patient arrived for Injectafer infusion. Vital signs taken and BP elevated at 176/113. Per patient, she did not take BP medications this morning. [facility] notified. Dr. Ronnald Ramp was not in office but on-call provider notified and advised that iron infusion be held for today and rescheduled.  Provider advised that patient follow up with Dr. Ronnald Ramp for BP management if BP remains elevated. Patient and patient's daughter advised. Patient is to take BP medications once she arrives home and is to continue to monitor BP.  Iron infusion rescheduled and patient is to come back next week. Patient alert, oriented and ambulatory with walker at discharge.

## 2019-12-26 ENCOUNTER — Other Ambulatory Visit: Payer: Self-pay | Admitting: Internal Medicine

## 2019-12-26 DIAGNOSIS — K580 Irritable bowel syndrome with diarrhea: Secondary | ICD-10-CM

## 2019-12-28 ENCOUNTER — Ambulatory Visit (HOSPITAL_COMMUNITY)
Admission: RE | Admit: 2019-12-28 | Discharge: 2019-12-28 | Disposition: A | Discharge status: Home / Self Care | Payer: Medicare Other | Source: Ambulatory Visit | Attending: Internal Medicine | Admitting: Internal Medicine

## 2019-12-28 ENCOUNTER — Telehealth: Payer: Self-pay

## 2019-12-28 ENCOUNTER — Other Ambulatory Visit: Payer: Self-pay

## 2019-12-28 DIAGNOSIS — D500 Iron deficiency anemia secondary to blood loss (chronic): Secondary | ICD-10-CM | POA: Diagnosis present

## 2019-12-28 MED ORDER — SODIUM CHLORIDE 0.9 % IV SOLN
INTRAVENOUS | Status: DC | PRN
  Administered 2019-12-28: 250 mL via INTRAVENOUS

## 2019-12-28 MED ORDER — SODIUM CHLORIDE 0.9 % IV SOLN
750.0000 mg | Freq: Once | INTRAVENOUS | Status: AC
  Administered 2019-12-28: 750 mg via INTRAVENOUS
  Filled 2019-12-28: qty 15

## 2019-12-28 NOTE — Progress Notes (Addendum)
PATIENT CARE CENTER NOTE  Diagnosis: Iron deficiency anemia due to chronic blood loss (D50.0)  Provider: Scarlette Calico, MD   Procedure: [facility] infusion   Note: Patient arrived for [facility] infusion. BP checked upon arrival since patient had to reschedule last appointment due to elevated BP. BP 166/75 in left arm and 183/78 in right arm. [place] [facility] called and [place], RN notified of patient's BP.  Patient is asymptomatic with elevated BP, so verbal order received from on-call MD to proceed with Injectafer infusion.  Patient received Injectafer infusion via PIV. Tolerated well with no adverse reaction. Observed patient for 30 minutes post-infusion. BP 183/84  post-infusion. Discharge instructions given. Patient is to come back next week for second infusion. Alert, oriented and ambulatory with walker at discharge.

## 2019-12-28 NOTE — Telephone Encounter (Signed)
Stephanie Martinez from [name] called b/c she has the patient there for the ordered iron infusion & pt BP 183/78, 166/75.  Pt states she has taken her prescribed hypertensive med & is without symptoms.  Dr Sharlet Salina notified of situation b/c Dr Ronnald Ramp is out of office, vital signs reviewed.  MD states that patient can proceed with infusion.

## 2019-12-28 NOTE — Discharge Instructions (Signed)

## 2019-12-31 ENCOUNTER — Encounter: Payer: Self-pay | Admitting: Internal Medicine

## 2019-12-31 ENCOUNTER — Other Ambulatory Visit: Payer: Self-pay | Admitting: Internal Medicine

## 2019-12-31 DIAGNOSIS — I10 Essential (primary) hypertension: Secondary | ICD-10-CM

## 2020-01-04 ENCOUNTER — Ambulatory Visit (HOSPITAL_COMMUNITY)
Admission: RE | Admit: 2020-01-04 | Discharge: 2020-01-04 | Disposition: A | Discharge status: Home / Self Care | Payer: Medicare Other | Source: Ambulatory Visit | Attending: Internal Medicine | Admitting: Internal Medicine

## 2020-01-04 ENCOUNTER — Other Ambulatory Visit: Payer: Self-pay

## 2020-01-04 ENCOUNTER — Telehealth: Payer: Self-pay | Admitting: Internal Medicine

## 2020-01-04 MED ORDER — SODIUM CHLORIDE 0.9 % IV SOLN: INTRAVENOUS | Status: DC | PRN

## 2020-01-04 MED ORDER — SODIUM CHLORIDE 0.9 % IV SOLN
750.0000 mg | Freq: Once | INTRAVENOUS | Status: DC
  Filled 2020-01-04: qty 15

## 2020-01-04 NOTE — Discharge Instructions (Signed)

## 2020-01-04 NOTE — Progress Notes (Signed)
Patient arrived to the [facility] to receive her second and final dose of Injectafer. Patient's vitals taken and BP elevated at 195/96. Patient's BP has been chronically elevated but this is above previous values. Patient was to follow up with primary provider concerning BP but she has not been able to make an appointment as of yet. Due to consistent increasing BP, Injectafer withheld. Patient needs to schedule an appointment with primary provider and be cleared to get last dose of Injectafer. Patient and patient's daughter advised. Primary Care at [facility] notified that patient did not receive last dose of Injectafer.

## 2020-01-04 NOTE — Telephone Encounter (Signed)
LVM for pt to call back as soon as possible.   RE: Checking on patient regarding high BP readings.

## 2020-01-04 NOTE — Telephone Encounter (Signed)
Stephanie Martinez with [facility] called this morning and said that the patients BP was 195/96 when she came in to get her iron. Stephanie Martinez said that she did not give it to her since her BP was high, also told the patient to follow up with PCP.

## 2020-01-09 NOTE — Telephone Encounter (Signed)
F/u    Mother could not get infusion due to blood pressure was high.

## 2020-01-12 ENCOUNTER — Other Ambulatory Visit: Payer: Self-pay | Admitting: Internal Medicine

## 2020-01-12 DIAGNOSIS — D500 Iron deficiency anemia secondary to blood loss (chronic): Secondary | ICD-10-CM

## 2020-01-12 MED ORDER — FERROUS SULFATE 220 (44 FE) MG/5ML PO LIQD: 5.0000 mL | Freq: Three times a day (TID) | ORAL | 1 refills | Status: DC

## 2020-01-14 NOTE — Telephone Encounter (Signed)
Lvm informing PCP sent in oral iron supplement.

## 2020-04-16 ENCOUNTER — Inpatient Hospital Stay: Payer: Medicare Other | Attending: Genetic Counselor | Admitting: Genetic Counselor

## 2020-04-16 DIAGNOSIS — Z803 Family history of malignant neoplasm of breast: Secondary | ICD-10-CM

## 2020-04-16 DIAGNOSIS — Z853 Personal history of malignant neoplasm of breast: Secondary | ICD-10-CM | POA: Diagnosis not present

## 2020-04-16 DIAGNOSIS — Z85038 Personal history of other malignant neoplasm of large intestine: Secondary | ICD-10-CM | POA: Diagnosis not present

## 2020-04-16 DIAGNOSIS — Z800 Family history of malignant neoplasm of digestive organs: Secondary | ICD-10-CM

## 2020-04-17 ENCOUNTER — Encounter: Payer: Self-pay | Admitting: Genetic Counselor

## 2020-04-17 DIAGNOSIS — Z800 Family history of malignant neoplasm of digestive organs: Secondary | ICD-10-CM

## 2020-04-17 DIAGNOSIS — Z803 Family history of malignant neoplasm of breast: Secondary | ICD-10-CM

## 2020-04-17 DIAGNOSIS — Z85038 Personal history of other malignant neoplasm of large intestine: Secondary | ICD-10-CM

## 2020-04-17 DIAGNOSIS — Z853 Personal history of malignant neoplasm of breast: Secondary | ICD-10-CM | POA: Insufficient documentation

## 2020-04-17 HISTORY — DX: Family history of malignant neoplasm of breast: Z80.3

## 2020-04-17 HISTORY — DX: Family history of malignant neoplasm of digestive organs: Z80.0

## 2020-04-17 HISTORY — DX: Personal history of other malignant neoplasm of large intestine: Z85.038

## 2020-04-17 NOTE — Progress Notes (Signed)
REFERRING PROVIDER: PRIMARY PROVIDER:  Janith Lima, MD  PRIMARY REASON FOR VISIT:  1. Personal history of malignant neoplasm of breast   2. Family history of breast cancer   3. Family history of colon cancer   4. History of colon cancer      HISTORY OF PRESENT ILLNESS:  I connected with  Stephanie Martinez on 04/17/2020 at 2:00 PM EDT by MyChart video conference and verified that I am speaking with the correct person using two identifiers.   Patient location: Home Provider location: [facility]  Stephanie Martinez, a 86 y.o. female, was seen for a [facility] cancer genetics consultation at the request of Dr. Ronnald Ramp due to a personal and family history of breast and colon cancer.  Stephanie Martinez presents to clinic today to discuss the possibility of a hereditary predisposition to cancer, genetic testing, and to further clarify her future cancer risks, as well as potential cancer risks for family members.   In 2003, at the age of 60, Stephanie Martinez was diagnosed with breast cancer and in 41 at the age of 60, she had been diagnosed with colon cancer.  Stephanie Martinez two daughters had genetic testing and were found to have BRCA2 mutations.  Stephanie Martinez was seen for genetic counseling to discuss genetic testing.   CANCER HISTORY:  Oncology History   No history exists.    Past Medical History:  Diagnosis Date  . Anemia   . Arthritis   . Breast cancer ([place])    lumpectomy and radiation  . CKD (chronic kidney disease), stage III ([place])   . Colon cancer ([place])    resection  . Family history of breast cancer 04/17/2020  . Family history of colon cancer 04/17/2020  . GERD (gastroesophageal reflux disease)   . Glaucoma   . History of colon cancer 04/17/2020  . Hyperlipidemia   . Hypertension   . Meniere disease   . Osteoporosis   . Overactive thyroid gland    treated with liquid iodine  . Vertigo    doing PT     Past Surgical History:  Procedure Laterality Date  . APPENDECTOMY    . BREAST  LUMPECTOMY Left   . BREAST SURGERY     lumpectomy-left  . ORIF HUMERAL SHAFT FRACTURE Left 02/02/2019  . ORIF HUMERUS FRACTURE Right 11/26/2014   Procedure: OPEN REDUCTION INTERNAL FIXATION (ORIF) HUMERAL SHAFT FRACTURE;  Surgeon: Altamese Buenaventura Lakes, MD;  Location: [facility];  Service: Orthopedics;  Laterality: Right;  . ORIF HUMERUS FRACTURE Left 02/02/2019   Procedure: OPEN REDUCTION INTERNAL FIXATION (ORIF) LEFT HUMERAL SHAFT NONUNION;  Surgeon: Altamese [place]ville, MD;  Location: [facility];  Service: Orthopedics;  Laterality: Left;  . SMALL INTESTINE SURGERY     resection  . SPINE SURGERY     x2  . TONSILLECTOMY AND ADENOIDECTOMY  before age 70  . TOTAL HIP ARTHROPLASTY Bilateral    bilateral one 20 years ago, other 8-10 years ago    Social History   Socioeconomic History  . Marital status: Widowed    Spouse name: Not on file  . Number of children: 2  . Years of education: Not on file  . Highest education level: Not on file  Occupational History  . Not on file  Tobacco Use  . Smoking status: Former Smoker    Years: 35.00    Types: Cigarettes  . Smokeless tobacco: Never Used  . Tobacco comment: in her early 32s  Vaping Use  . Vaping Use: Never used  Substance and Sexual Activity  . Alcohol use: Yes    Alcohol/week: 5.0 - 6.0 standard drinks    Types: 5 - 6 Standard drinks or equivalent per week    Comment: Wine  . Drug use: No  . Sexual activity: Never    Partners: Male  Other Topics Concern  . Not on file  Social History Narrative  . Not on file   Social Determinants of Health   Financial Resource Strain:   . Difficulty of Paying Living Expenses: Not on file  Food Insecurity:   . Worried About Charity fundraiser in the Last Year: Not on file  . Ran Out of Food in the Last Year: Not on file  Transportation Needs:   . Lack of Transportation (Medical): Not on file  . Lack of Transportation (Non-Medical): Not on file  Physical Activity:   . Days of Exercise per Week: Not on file   . Minutes of Exercise per Session: Not on file  Stress:   . Feeling of Stress : Not on file  Social Connections:   . Frequency of Communication with Friends and Family: Not on file  . Frequency of Social Gatherings with Friends and Family: Not on file  . Attends Religious Services: Not on file  . Active Member of Clubs or Organizations: Not on file  . Attends Archivist Meetings: Not on file  . Marital Status: Not on file     FAMILY HISTORY:  We obtained a detailed, 4-generation family history.  Significant diagnoses are listed below: Family History  Problem Relation Age of Onset  . Stroke Mother   . Colon cancer Father        dx in 67s  . Lung cancer Sister 49  . BRCA 1/2 Daughter        BRCA2 positive  . BRCA 1/2 Daughter        BRCA2 positive  . Breast cancer Sister 64       bilateral breast cancer, 2nd dx at 65  . Colon cancer Sister 58  . Breast cancer Sister 48       bilateral breast cancer; 2nd dx age 41  . Leukemia Other        dx in his 59s, youngest sister's son  . Hypertension Other        materal side  . Breast cancer Paternal Aunt        <50  . Thyroid disease Daughter   . Heart disease Neg Hx   . Hyperlipidemia Neg Hx     The patient has two daughters who are cancer free but both have BRCA2 mutations.  She ha three sisters, one died of lung cancer, and two had breast cancer.  One of these had colon cancer as well.  Both parents are deceased.  The patient's father had colon cancer and he had a sister who had breast cancer.  There were several other siblings without cancer.  The paternal grandparents are deceased and did not have cancer.  The patient's mother had several siblings, none reported to have cancer.  The maternal grandparents were also reported to be cancer free.  Stephanie Martinez is unaware of previous family history of genetic testing for hereditary cancer risks. Patient's maternal ancestors are of [place] descent, and paternal ancestors are  of [place] descent. There is no reported Ashkenazi Jewish ancestry. There is no known consanguinity.  GENETIC COUNSELING ASSESSMENT: Stephanie Martinez is a 86 y.o. female with a personal and family  history of breast and colon cancer which is somewhat suggestive of a hereditary cancer syndrome and predisposition to cancer given the number of cases of breast and colon cancer and the known BRCA2 mutation in the family. We, therefore, discussed and recommended the following at today's visit.   DISCUSSION: We discussed that 5 - 10% of breast cancer is hereditary, with most cases associated with BRCA mutations.  There are other genes that can be associated with hereditary breast cancer syndromes.  These include ATM, CHEK2 and PALB2.  Additionally, about 5-7% of colon cancer is hereditary, with most cases due to Lynch syndrome.  Both of the patient's daughters were found to have BRCA2 mutations.  This means that she has a 50% chance of also having this same mutation.  We discussed that testing is beneficial for several reasons including knowing how to follow individuals after completing their treatment, and understand if other family members could be at risk for cancer and allow them to undergo genetic testing.   We reviewed the characteristics, features and inheritance patterns of hereditary cancer syndromes. We also discussed genetic testing, including the appropriate family members to test, the process of testing, insurance coverage and turn-around-time for results. We discussed the implications of a negative, positive, carrier and/or variant of uncertain significant result. We recommended Stephanie Martinez pursue genetic testing for the multi-cancer gene panel+RNA.The Multi-Gene Panel offered by [facility] includes sequencing and/or deletion duplication testing of the following 85 genes: AIP, ALK, APC, ATM, AXIN2,BAP1,  BARD1, BLM, BMPR1A, BRCA1, BRCA2, BRIP1, CASR, CDC73, CDH1, CDK4, CDKN1B, CDKN1C, CDKN2A (p14ARF), CDKN2A  (p16INK4a), CEBPA, CHEK2, CTNNA1, DICER1, DIS3L2, EGFR (c.2369C>T, p.Thr790Met variant only), EPCAM (Deletion/duplication testing only), FH, FLCN, GATA2, GPC3, GREM1 (Promoter region deletion/duplication testing only), HOXB13 (c.251G>A, p.Gly84Glu), HRAS, KIT, MAX, MEN1, MET, MITF (c.952G>A, p.Glu318Lys variant only), MLH1, MSH2, MSH3, MSH6, MUTYH, NBN, NF1, NF2, NTHL1, PALB2, PDGFRA, PHOX2B, PMS2, POLD1, POLE, POT1, PRKAR1A, PTCH1, PTEN, RAD50, RAD51C, RAD51D, RB1, RECQL4, RET, RNF43, RUNX1, SDHAF2, SDHA (sequence changes only), SDHB, SDHC, SDHD, SMAD4, SMARCA4, SMARCB1, SMARCE1, STK11, SUFU, TERC, TERT, TMEM127, TP53, TSC1, TSC2, VHL, WRN and WT1.     Based on Stephanie Martinez's personal and family history of cancer, she meets medical criteria for genetic testing. Despite that she meets criteria, she may still have an out of pocket cost. We discussed that if her out of pocket cost for testing is over $100, the laboratory will call and confirm whether she wants to proceed with testing.  If the out of pocket cost of testing is less than $100 she will be billed by the genetic testing laboratory.   After the conversation, we found that Stephanie Martinez' had been tested through our site in December 2013.  At that time, she was negative for the BRCA2 familial variant, but had two VUS', one in BRIP1 that was ultimately reclassified as benign, and another in ATM that is still classified as a VUS.  We discussed that RNA testing could help clarify risks for both breast and colon cancer, however, the patient's daughter who has polyposis had RNA testing that was negative.  Therefore the RNA testing did not identify a cause of the polyposis.    PLAN: Stephanie Martinez did not wish to pursue genetic testing at today's visit. We understand this decision and remain available to coordinate genetic testing at any time in the future. We, therefore, recommend Stephanie Martinez continue to follow the cancer screening guidelines given by her primary  healthcare provider.  Lastly, we encouraged Stephanie Martinez  to remain in contact with cancer genetics annually so that we can continuously update the family history and inform her of any changes in cancer genetics and testing that may be of benefit for this family.   Ms. Darwish questions were answered to her satisfaction today. Our contact information was provided should additional questions or concerns arise. Thank you for the referral and allowing [place] to share in the care of your patient.   Breona Cherubin P. Florene Glen, [place], [facility] Licensed, Insurance risk surveyor [email or web address] phone: [phone]  The patient was seen for a total of 45 minutes in face-to-face genetic counseling.  This patient was discussed with Drs. Magrinat, Lindi Adie and/or Burr Medico who agrees with the above.    _______________________________________________________________________ For Office Staff:  Number of people involved in session: 2 Was an Intern/ student involved with case: yes

## 2020-05-22 ENCOUNTER — Telehealth: Payer: Self-pay | Admitting: Internal Medicine

## 2020-05-22 NOTE — Telephone Encounter (Signed)
Patients daughter Izora Gala calling stating that the patient has been having some tenderness and swelling in her left breast and she was trying to get her an appointment with the breast center because of the patients history with breast cancer and they stated she would need a referral, so the patients daughter was trying to see if the patient needed to come in for the referral or if it could just be done.  [phone]

## 2020-05-22 NOTE — Telephone Encounter (Signed)
Pt's last ov was 6/24. She would need an OV.

## 2020-05-22 NOTE — Telephone Encounter (Signed)
Called and left a voicemail to make an appointment.

## 2020-05-27 ENCOUNTER — Encounter: Payer: Self-pay | Admitting: Internal Medicine

## 2020-05-27 ENCOUNTER — Other Ambulatory Visit: Payer: Self-pay

## 2020-05-27 ENCOUNTER — Ambulatory Visit (INDEPENDENT_AMBULATORY_CARE_PROVIDER_SITE_OTHER): Payer: Medicare Other | Admitting: Internal Medicine

## 2020-05-27 VITALS — BP 144/90 | HR 81 | Temp 98.2°F | Resp 16 | Ht 62.0 in | Wt 126.0 lb

## 2020-05-27 DIAGNOSIS — N6320 Unspecified lump in the left breast, unspecified quadrant: Secondary | ICD-10-CM | POA: Diagnosis not present

## 2020-05-27 DIAGNOSIS — N1831 Chronic kidney disease, stage 3a: Secondary | ICD-10-CM | POA: Diagnosis not present

## 2020-05-27 DIAGNOSIS — Z23 Encounter for immunization: Secondary | ICD-10-CM | POA: Diagnosis not present

## 2020-05-27 DIAGNOSIS — D500 Iron deficiency anemia secondary to blood loss (chronic): Secondary | ICD-10-CM

## 2020-05-27 DIAGNOSIS — I10 Essential (primary) hypertension: Secondary | ICD-10-CM | POA: Diagnosis not present

## 2020-05-27 LAB — BASIC METABOLIC PANEL
BUN: 28 mg/dL — ABNORMAL HIGH (ref 6–23)
CO2: 26 mEq/L (ref 19–32)
Calcium: 9 mg/dL (ref 8.4–10.5)
Chloride: 104 mEq/L (ref 96–112)
Creatinine, Ser: 1.71 mg/dL — ABNORMAL HIGH (ref 0.40–1.20)
GFR: 24.98 mL/min — ABNORMAL LOW (ref >60.00)
Glucose, Bld: 89 mg/dL (ref 70–99)
Potassium: 4.2 mEq/L (ref 3.5–5.1)
Sodium: 138 mEq/L (ref 135–145)

## 2020-05-27 LAB — CBC WITH DIFFERENTIAL/PLATELET
Basophils Absolute: 0.1 10*3/uL (ref 0.0–0.1)
Basophils Relative: 1.2 % (ref 0.0–3.0)
Eosinophils Absolute: 0.4 10*3/uL (ref 0.0–0.7)
Eosinophils Relative: 4.6 % (ref 0.0–5.0)
HCT: 37.2 % (ref 36.0–46.0)
Hemoglobin: 12.5 g/dL (ref 12.0–15.0)
Lymphocytes Relative: 13.1 % (ref 12.0–46.0)
Lymphs Abs: 1.1 10*3/uL (ref 0.7–4.0)
MCHC: 33.5 g/dL (ref 30.0–36.0)
MCV: 95.7 fl (ref 78.0–100.0)
Monocytes Absolute: 0.7 10*3/uL (ref 0.1–1.0)
Monocytes Relative: 8.2 % (ref 3.0–12.0)
Neutro Abs: 6 10*3/uL (ref 1.4–7.7)
Neutrophils Relative %: 72.9 % (ref 43.0–77.0)
Platelets: 265 10*3/uL (ref 150.0–400.0)
RBC: 3.89 Mil/uL (ref 3.87–5.11)
RDW: 12.8 % (ref 11.5–15.5)
WBC: 8.3 10*3/uL (ref 4.0–10.5)

## 2020-05-27 LAB — FERRITIN: Ferritin: 76.4 ng/mL (ref 10.0–291.0)

## 2020-05-27 LAB — IRON: Iron: 66 ug/dL (ref 42–145)

## 2020-05-27 NOTE — Progress Notes (Signed)
Subjective:  Patient ID: Stephanie Martinez, female    DOB: Jun 15, 1922  Age: 86 y.o. MRN: [id number]  CC: Hypertension and Anemia  This visit occurred during the SARS-CoV-2 public health emergency.  Safety protocols were in place, including screening questions prior to the visit, additional usage of staff PPE, and extensive cleaning of exam room while observing appropriate contact time as indicated for disinfecting solutions.    HPI Stephanie Martinez presents for f/up - She continues to have intermittent diarrhea.  She does not see blood in her stool.  She denies abdominal pain, loss of appetite, weight loss, or melena.  She has a history of iron deficiency anemia and is not currently taking an iron supplement.  Over the last few months she has noticed a painful mass in her left mid outer breast.  She does not know exactly when she started noticing it.  She said the skin looks and feels normal and she denies bleeding or discharge from her nipple.  She tells me her blood pressure has been well controlled.  She denies headache, blurred vision, chest pain, or shortness of breath.  She has a baseline level of lower extremity edema.   Outpatient Medications Prior to Visit  Medication Sig Dispense Refill  . acetaminophen (TYLENOL) 500 MG tablet Take 500 mg by mouth every 6 (six) hours as needed (pain.).     Marland Kitchen carvedilol (COREG) 12.5 MG tablet TAKE 1 TABLET BY MOUTH TWICE DAILY 180 tablet 1  . famotidine (PEPCID) 20 MG tablet Take 1 tablet (20 mg total) by mouth 2 (two) times daily. 180 tablet 1  . latanoprost (XALATAN) 0.005 % ophthalmic solution Place 1 drop into both eyes at bedtime.     . Lutein 20 MG CAPS Take 20 mg by mouth daily before lunch.    . Eluxadoline (VIBERZI) 75 MG TABS Take 1 tablet by mouth daily. 16 tablet 0  . Ferrous Sulfate 220 (44 Fe) MG/5ML LIQD Take 5 mLs by mouth with breakfast, with lunch, and with evening meal. 473 mL 1  . XIFAXAN 550 MG TABS tablet TAKE 1  TABLET(550 MG) BY MOUTH THREE TIMES DAILY FOR 14 DAYS 42 tablet 0   No facility-administered medications prior to visit.    ROS Review of Systems  Constitutional: Positive for fatigue. Negative for diaphoresis and unexpected weight change.  HENT: Negative.   Eyes: Negative.   Respiratory: Negative for cough, chest tightness, shortness of breath and wheezing.   Cardiovascular: Negative for chest pain, palpitations and leg swelling.  Gastrointestinal: Positive for diarrhea. Negative for blood in stool, nausea and vomiting.  Endocrine: Negative.   Genitourinary: Negative.  Negative for difficulty urinating.  Musculoskeletal: Negative.   Skin: Negative.  Negative for pallor.  Neurological: Negative.  Negative for dizziness, weakness and light-headedness.  Hematological: Negative for adenopathy. Does not bruise/bleed easily.  Psychiatric/Behavioral: Negative.     Objective:  BP (!) 144/90   Pulse 81   Temp 98.2 F (36.8 C) (Oral)   Resp 16   Ht 5\' 2"  (1.575 m)   Wt 126 lb (57.2 kg)   SpO2 97%   BMI 23.05 kg/m   BP Readings from Last 3 Encounters:  05/27/20 (!) 144/90  01/04/20 (!) 195/96  12/28/19 (!) 183/84    Wt Readings from Last 3 Encounters:  05/27/20 126 lb (57.2 kg)  12/13/19 121 lb (54.9 kg)  12/05/19 121 lb (54.9 kg)    Physical Exam Vitals reviewed.  Constitutional:  Appearance: Normal appearance. She is ill-appearing (thin, frail).  HENT:     Nose: Nose normal.     Mouth/Throat:     Mouth: Mucous membranes are moist.  Eyes:     General: No scleral icterus.    Conjunctiva/sclera: Conjunctivae normal.  Cardiovascular:     Rate and Rhythm: Normal rate and regular rhythm.     Heart sounds: No murmur heard.   Pulmonary:     Effort: Pulmonary effort is normal.     Breath sounds: No stridor. No wheezing, rhonchi or rales.  Chest:  Breasts:     Right: Normal. No axillary adenopathy or supraclavicular adenopathy.     Left: Mass present. No  swelling, bleeding, inverted nipple, nipple discharge, skin change, tenderness, axillary adenopathy or supraclavicular adenopathy.     Abdominal:     General: Abdomen is flat. Bowel sounds are normal. There is no distension.     Palpations: There is no hepatomegaly, splenomegaly or mass.     Tenderness: There is no abdominal tenderness. There is no guarding.  Musculoskeletal:        General: Normal range of motion.     Cervical back: Neck supple.     Right lower leg: Edema (trace pitting) present.     Left lower leg: Edema (trace pitting) present.  Lymphadenopathy:     Cervical: No cervical adenopathy.     Upper Body:     Right upper body: No supraclavicular, axillary or pectoral adenopathy.     Left upper body: No supraclavicular, axillary or pectoral adenopathy.  Skin:    General: Skin is warm and dry.     Coloration: Skin is not pale.  Neurological:     General: No focal deficit present.     Mental Status: She is alert.  Psychiatric:        Mood and Affect: Mood normal.        Behavior: Behavior normal.     Lab Results  Component Value Date   WBC 8.3 05/27/2020   HGB 12.5 05/27/2020   HCT 37.2 05/27/2020   PLT 265.0 05/27/2020   GLUCOSE 89 05/27/2020   CHOL 193 12/05/2019   TRIG 202.0 (H) 12/05/2019   HDL 39.70 12/05/2019   LDLDIRECT 107.0 12/05/2019   LDLCALC 102 (H) 11/26/2014   ALT 8 12/05/2019   AST 15 12/05/2019   NA 138 05/27/2020   K 4.2 05/27/2020   CL 104 05/27/2020   CREATININE 1.71 (H) 05/27/2020   BUN 28 (H) 05/27/2020   CO2 26 05/27/2020   TSH 2.02 12/05/2019   INR 1.0 02/02/2019   HGBA1C 5.7 09/18/2012    No results found.  Assessment & Plan:   Stephanie Martinez was seen today for hypertension and anemia.  Diagnoses and all orders for this visit:  Essential hypertension, benign- Her blood pressure is adequately well controlled. -     CBC with Differential/Platelet; Future -     Basic metabolic panel; Future -     Basic metabolic panel -      CBC with Differential/Platelet  Stage 3a chronic kidney disease ([place])- Her renal function is stable and her blood pressure is adequately well controlled.  She will avoid nephrotoxic agents. -     CBC with Differential/Platelet; Future -     Basic metabolic panel; Future -     Basic metabolic panel -     CBC with Differential/Platelet  Iron deficiency anemia due to chronic blood loss- Her H&H and iron levels are normal  now with no supplementation.  She has mild, chronic diarrhea.  I will screen her for celiac disease. -     CBC with Differential/Platelet; Future -     Iron; Future -     Ferritin; Future -     Gliadin antibodies, serum; Future -     Tissue transglutaminase, IgA; Future -     Reticulin Antibody, IgA w reflex titer; Future -     Tissue transglutaminase, IgA -     Reticulin Antibody, IgA w reflex titer -     Gliadin antibodies, serum -     Ferritin -     Iron -     CBC with Differential/Platelet  Flu vaccine need -     Flu Vaccine QUAD High Dose(Fluad)  Large mass of left breast- Will begin a diagnostic work-up of this. -     MM DIAG BREAST TOMO BILATERAL; Future   I have discontinued Stephanie Martinez's Viberzi, Xifaxan, and Ferrous Sulfate. I am also having her maintain her latanoprost, acetaminophen, Lutein, famotidine, and carvedilol.  No orders of the defined types were placed in this encounter.  I spent 50 minutes in preparing to see the patient by review of recent labs, imaging and procedures, obtaining and reviewing separately obtained history, communicating with the patient and family or caregiver, ordering medications, tests or procedures, and documenting clinical information in the EHR including the differential Dx, treatment, and any further evaluation and other management of 1. Essential hypertension, benign 2. Stage 3a chronic kidney disease ([place]) 3. Iron deficiency anemia due to chronic blood loss 4. Large mass of left breast      Follow-up:  Return in about 3 months (around 08/25/2020).  Scarlette Calico, MD

## 2020-05-27 NOTE — Patient Instructions (Signed)
Anemia  Anemia is a condition in which you do not have enough red blood cells or hemoglobin. Hemoglobin is a substance in red blood cells that carries oxygen. When you do not have enough red blood cells or hemoglobin (are anemic), your body cannot get enough oxygen and your organs may not work properly. As a result, you may feel very tired or have other problems. What are the causes? Common causes of anemia include:  Excessive bleeding. Anemia can be caused by excessive bleeding inside or outside the body, including bleeding from the intestine or from periods in women.  Poor nutrition.  Long-lasting (chronic) kidney, thyroid, and liver disease.  Bone marrow disorders.  Cancer and treatments for cancer.  HIV (human immunodeficiency virus) and AIDS (acquired immunodeficiency syndrome).  Treatments for HIV and AIDS.  Spleen problems.  Blood disorders.  Infections, medicines, and autoimmune disorders that destroy red blood cells. What are the signs or symptoms? Symptoms of this condition include:  Minor weakness.  Dizziness.  Headache.  Feeling heartbeats that are irregular or faster than normal (palpitations).  Shortness of breath, especially with exercise.  Paleness.  Cold sensitivity.  Indigestion.  Nausea.  Difficulty sleeping.  Difficulty concentrating. Symptoms may occur suddenly or develop slowly. If your anemia is mild, you may not have symptoms. How is this diagnosed? This condition is diagnosed based on:  Blood tests.  Your medical history.  A physical exam.  Bone marrow biopsy. Your health care provider may also check your stool (feces) for blood and may do additional testing to look for the cause of your bleeding. You may also have other tests, including:  Imaging tests, such as a CT scan or MRI.  Endoscopy.  Colonoscopy. How is this treated? Treatment for this condition depends on the cause. If you continue to lose a lot of blood, you may  need to be treated at a hospital. Treatment may include:  Taking supplements of iron, vitamin S31, or folic acid.  Taking a hormone medicine (erythropoietin) that can help to stimulate red blood cell growth.  Having a blood transfusion. This may be needed if you lose a lot of blood.  Making changes to your diet.  Having surgery to remove your spleen. Follow these instructions at home:  Take over-the-counter and prescription medicines only as told by your health care provider.  Take supplements only as told by your health care provider.  Follow any diet instructions that you were given.  Keep all follow-up visits as told by your health care provider. This is important. Contact a health care provider if:  You develop new bleeding anywhere in the body. Get help right away if:  You are very weak.  You are short of breath.  You have pain in your abdomen or chest.  You are dizzy or feel faint.  You have trouble concentrating.  You have bloody or black, tarry stools.  You vomit repeatedly or you vomit up blood. Summary  Anemia is a condition in which you do not have enough red blood cells or enough of a substance in your red blood cells that carries oxygen (hemoglobin).  Symptoms may occur suddenly or develop slowly.  If your anemia is mild, you may not have symptoms.  This condition is diagnosed with blood tests as well as a medical history and physical exam. Other tests may be needed.  Treatment for this condition depends on the cause of the anemia. This information is not intended to replace advice given to you by  your health care provider. Make sure you discuss any questions you have with your health care provider. Document Revised: 05/20/2017 Document Reviewed: 07/09/2016 Elsevier Patient Education  2020 [employer]vier Inc.  

## 2020-06-02 ENCOUNTER — Encounter: Payer: Self-pay | Admitting: Internal Medicine

## 2020-06-03 ENCOUNTER — Other Ambulatory Visit: Payer: Self-pay | Admitting: Internal Medicine

## 2020-06-03 DIAGNOSIS — N6320 Unspecified lump in the left breast, unspecified quadrant: Secondary | ICD-10-CM

## 2020-06-04 LAB — RETICULIN ANTIBODIES, IGA W TITER: Reticulin IgA Screen: NEGATIVE

## 2020-06-04 LAB — GLIADIN ANTIBODIES, SERUM
Gliadin IgA: <1 U/mL
Gliadin IgG: <1 U/mL

## 2020-06-04 LAB — TISSUE TRANSGLUTAMINASE, IGA: (tTG) Ab, IgA: <1 U/mL

## 2020-06-05 ENCOUNTER — Ambulatory Visit
Admission: RE | Admit: 2020-06-05 | Discharge: 2020-06-05 | Disposition: A | Discharge status: Home / Self Care | Payer: Medicare Other | Source: Ambulatory Visit | Attending: Internal Medicine | Admitting: Internal Medicine

## 2020-06-05 ENCOUNTER — Other Ambulatory Visit: Payer: Self-pay | Admitting: Internal Medicine

## 2020-06-05 ENCOUNTER — Other Ambulatory Visit: Payer: Self-pay

## 2020-06-05 DIAGNOSIS — N6320 Unspecified lump in the left breast, unspecified quadrant: Secondary | ICD-10-CM

## 2020-06-05 HISTORY — DX: Personal history of irradiation: Z92.3

## 2020-06-22 ENCOUNTER — Other Ambulatory Visit: Payer: Self-pay | Admitting: Internal Medicine

## 2020-06-22 DIAGNOSIS — I10 Essential (primary) hypertension: Secondary | ICD-10-CM

## 2020-07-10 ENCOUNTER — Other Ambulatory Visit: Payer: Self-pay | Admitting: Internal Medicine

## 2020-07-10 DIAGNOSIS — K2100 Gastro-esophageal reflux disease with esophagitis, without bleeding: Secondary | ICD-10-CM

## 2020-08-29 ENCOUNTER — Encounter (HOSPITAL_COMMUNITY): Payer: Self-pay

## 2020-08-29 ENCOUNTER — Emergency Department (HOSPITAL_COMMUNITY): Payer: Medicare Other

## 2020-08-29 ENCOUNTER — Emergency Department (HOSPITAL_COMMUNITY)
Admission: EM | Admit: 2020-08-29 | Discharge: 2020-08-30 | Disposition: A | Discharge status: Home / Self Care | Payer: Medicare Other | Attending: Emergency Medicine | Admitting: Emergency Medicine

## 2020-08-29 ENCOUNTER — Other Ambulatory Visit: Payer: Self-pay

## 2020-08-29 DIAGNOSIS — W01198A Fall on same level from slipping, tripping and stumbling with subsequent striking against other object, initial encounter: Secondary | ICD-10-CM | POA: Diagnosis not present

## 2020-08-29 DIAGNOSIS — R112 Nausea with vomiting, unspecified: Secondary | ICD-10-CM | POA: Insufficient documentation

## 2020-08-29 DIAGNOSIS — R55 Syncope and collapse: Secondary | ICD-10-CM | POA: Diagnosis not present

## 2020-08-29 DIAGNOSIS — Z23 Encounter for immunization: Secondary | ICD-10-CM | POA: Insufficient documentation

## 2020-08-29 DIAGNOSIS — I129 Hypertensive chronic kidney disease with stage 1 through stage 4 chronic kidney disease, or unspecified chronic kidney disease: Secondary | ICD-10-CM | POA: Insufficient documentation

## 2020-08-29 DIAGNOSIS — N183 Chronic kidney disease, stage 3 unspecified: Secondary | ICD-10-CM | POA: Insufficient documentation

## 2020-08-29 DIAGNOSIS — J418 Mixed simple and mucopurulent chronic bronchitis: Secondary | ICD-10-CM | POA: Diagnosis not present

## 2020-08-29 DIAGNOSIS — Z96643 Presence of artificial hip joint, bilateral: Secondary | ICD-10-CM | POA: Insufficient documentation

## 2020-08-29 DIAGNOSIS — S81012A Laceration without foreign body, left knee, initial encounter: Secondary | ICD-10-CM | POA: Insufficient documentation

## 2020-08-29 DIAGNOSIS — Z79899 Other long term (current) drug therapy: Secondary | ICD-10-CM | POA: Insufficient documentation

## 2020-08-29 DIAGNOSIS — M25552 Pain in left hip: Secondary | ICD-10-CM | POA: Insufficient documentation

## 2020-08-29 DIAGNOSIS — Z853 Personal history of malignant neoplasm of breast: Secondary | ICD-10-CM | POA: Diagnosis not present

## 2020-08-29 DIAGNOSIS — Z85038 Personal history of other malignant neoplasm of large intestine: Secondary | ICD-10-CM | POA: Insufficient documentation

## 2020-08-29 DIAGNOSIS — Z87891 Personal history of nicotine dependence: Secondary | ICD-10-CM | POA: Insufficient documentation

## 2020-08-29 DIAGNOSIS — S8992XA Unspecified injury of left lower leg, initial encounter: Secondary | ICD-10-CM | POA: Diagnosis present

## 2020-08-29 DIAGNOSIS — S0990XA Unspecified injury of head, initial encounter: Secondary | ICD-10-CM

## 2020-08-29 MED ORDER — TETANUS-DIPHTH-ACELL PERTUSSIS 5-2.5-18.5 LF-MCG/0.5 IM SUSY
0.5000 mL | PREFILLED_SYRINGE | Freq: Once | INTRAMUSCULAR | Status: AC
  Administered 2020-08-30: 0.5 mL via INTRAMUSCULAR
  Filled 2020-08-29: qty 0.5

## 2020-08-29 NOTE — ED Notes (Signed)
Dr Tyrone Nine at bedside

## 2020-08-29 NOTE — ED Triage Notes (Signed)
Per [facility] pt coming from home, daughter called after pt had a syncopal episode and fall. Pt complaining of left hip pain,. Pt has been having nausea and vomiting tonight and chronic diarrhea for over two weeks. Skin tear noted to right knee, denies LOC, aao4, ambulatory on scene. 4mg  zofran and 20g left forearm. 12 lead unremarkable, hypertensive 210/110 hr 80, 96% on room air. cbg 162

## 2020-08-30 ENCOUNTER — Emergency Department (HOSPITAL_COMMUNITY): Payer: Medicare Other

## 2020-08-30 NOTE — Discharge Instructions (Signed)
Return for worsening confusion persistent vomiting.  Please follow-up with your family doctor.

## 2020-08-30 NOTE — ED Provider Notes (Signed)
[facility] EMERGENCY DEPARTMENT Provider Note   CSN: [id number] Arrival date & time: 08/29/20  2326     History Chief Complaint  Patient presents with  . Fall    Stephanie Martinez is a 87 y.o. female.  87 yo F with a cc of a syncopal event.  Patient with hx of episodes of n/v/d that leave her weak and sometimes she falls from it.  Going on for months.  Had an episode this afternoon and fell and hit her head.  Pain also to the left hip.  Denies neck pain, chest pain, back pain.  Feels back to her baseline.   The history is provided by the patient.  Fall This is a new problem. The current episode started yesterday. The problem occurs constantly. The problem has not changed since onset.Associated symptoms include headaches. Pertinent negatives include no chest pain and no shortness of breath. Nothing aggravates the symptoms. Nothing relieves the symptoms. She has tried nothing for the symptoms. The treatment provided no relief.       Past Medical History:  Diagnosis Date  . Anemia   . Arthritis   . Breast cancer ([place])    lumpectomy and radiation  . CKD (chronic kidney disease), stage III ([place])   . Colon cancer ([place])    resection  . Family history of breast cancer 04/17/2020  . Family history of colon cancer 04/17/2020  . GERD (gastroesophageal reflux disease)   . Glaucoma   . History of colon cancer 04/17/2020  . Hyperlipidemia   . Hypertension   . Meniere disease   . Osteoporosis   . Overactive thyroid gland    treated with liquid iodine  . Personal history of radiation therapy   . Vertigo    doing PT     Patient Active Problem List   Diagnosis Date Noted  . Large mass of left breast 05/27/2020  . Flu vaccine need 05/27/2020  . Personal history of malignant neoplasm of breast 04/17/2020  . Iron deficiency anemia due to chronic blood loss 12/18/2019  . Hypertension   . Osteoporosis   . IBS (irritable bowel syndrome) 09/20/2018  . Routine  general medical examination at a health care facility 09/06/2018  . Seizures ([place]) 07/22/2015  . Upper airway cough syndrome 05/23/2015  . Vitamin D deficiency 12/05/2014  . CKD (chronic kidney disease), stage III ([place])   . Allergic rhinitis 01/22/2013  . COPD mixed type ([place]) 01/05/2013  . Other abnormal glucose 09/18/2012  . Spondylosis of lumbar region without myelopathy or radiculopathy 07/27/2012  . Low back pain 04/26/2012  . Meniere disease   . GERD (gastroesophageal reflux disease)   . Osteoarthritis of hip   . Breast cancer ([place])   . Eczema 12/29/2011  . Essential hypertension, benign 08/27/2011  . Other screening mammogram 08/27/2011  . Pure hypercholesterolemia 08/27/2011  . Glaucoma 08/27/2011    Past Surgical History:  Procedure Laterality Date  . APPENDECTOMY    . BREAST BIOPSY Right   . BREAST BIOPSY Left   . BREAST LUMPECTOMY Left   . BREAST SURGERY     lumpectomy-left  . ORIF HUMERAL SHAFT FRACTURE Left 02/02/2019  . ORIF HUMERUS FRACTURE Right 11/26/2014   Procedure: OPEN REDUCTION INTERNAL FIXATION (ORIF) HUMERAL SHAFT FRACTURE;  Surgeon: Altamese Iona, MD;  Location: [facility];  Service: Orthopedics;  Laterality: Right;  . ORIF HUMERUS FRACTURE Left 02/02/2019   Procedure: OPEN REDUCTION INTERNAL FIXATION (ORIF) LEFT HUMERAL SHAFT NONUNION;  Surgeon: Altamese Barrett,  MD;  Location: [facility];  Service: Orthopedics;  Laterality: Left;  . SMALL INTESTINE SURGERY     resection  . SPINE SURGERY     x2  . TONSILLECTOMY AND ADENOIDECTOMY  before age 81  . TOTAL HIP ARTHROPLASTY Bilateral    bilateral one 20 years ago, other 8-10 years ago     OB History    Gravida  2   Para  2   Term      Preterm      AB      Living  2     SAB      IAB      Ectopic      Multiple      Live Births           Obstetric Comments  ? If had MAB        Family History  Problem Relation Age of Onset  . Stroke Mother   . Colon cancer Father        dx in 79s  .  Lung cancer Sister 61  . BRCA 1/2 Daughter        BRCA2 positive  . BRCA 1/2 Daughter        BRCA2 positive  . Breast cancer Sister 13       bilateral breast cancer, 2nd dx at 57  . Colon cancer Sister 95  . Breast cancer Sister 11       bilateral breast cancer; 2nd dx age 65  . Leukemia Other        dx in his 58s, youngest sister's son  . Hypertension Other        materal side  . Breast cancer Paternal Aunt        <50  . Thyroid disease Daughter   . Heart disease Neg Hx   . Hyperlipidemia Neg Hx     Social History   Tobacco Use  . Smoking status: Former Smoker    Years: 35.00    Types: Cigarettes  . Smokeless tobacco: Never Used  . Tobacco comment: in her early 63s  Vaping Use  . Vaping Use: Never used  Substance Use Topics  . Alcohol use: Yes    Alcohol/week: 5.0 - 6.0 standard drinks    Types: 5 - 6 Standard drinks or equivalent per week    Comment: Wine  . Drug use: No    Home Medications Prior to Admission medications   Medication Sig Start Date End Date Taking? Authorizing Provider  acetaminophen (TYLENOL) 500 MG tablet Take 500 mg by mouth every 6 (six) hours as needed (pain.).     [provider]  carvedilol (COREG) 12.5 MG tablet TAKE 1 TABLET BY MOUTH TWICE DAILY 06/22/20   Janith Lima, MD  famotidine (PEPCID) 20 MG tablet TAKE 1 TABLET(20 MG) BY MOUTH TWICE DAILY 07/10/20   Janith Lima, MD  latanoprost (XALATAN) 0.005 % ophthalmic solution Place 1 drop into both eyes at bedtime.     [provider]  Lutein 20 MG CAPS Take 20 mg by mouth daily before lunch.    [provider]    Allergies    Iodine and Amlodipine  Review of Systems   Review of Systems  Constitutional: Negative for chills and fever.  HENT: Negative for congestion and rhinorrhea.   Eyes: Negative for redness and visual disturbance.  Respiratory: Negative for shortness of breath and wheezing.   Cardiovascular: Negative for chest pain and palpitations.  Gastrointestinal: Positive for diarrhea, nausea and vomiting.  Genitourinary: Negative for dysuria and urgency.  Musculoskeletal: Negative for arthralgias and myalgias.  Skin: Negative for pallor and wound.  Neurological: Positive for syncope and headaches. Negative for dizziness.    Physical Exam Updated Vital Signs BP (!) 206/94 (BP Location: Right Arm)   Pulse 81   Temp (!) 97.5 F (36.4 C) (Oral)   Resp 19   Ht _0  (1.575 m)   Wt 49.9 kg   SpO2 94%   BMI 20.12 kg/m   Physical Exam Vitals and nursing note reviewed.  Constitutional:      General: She is not in acute distress.    Appearance: She is well-developed. She is not diaphoretic.  HENT:     Head: Normocephalic and atraumatic.  Eyes:     Pupils: Pupils are equal, round, and reactive to light.  Cardiovascular:     Rate and Rhythm: Normal rate and regular rhythm.     Heart sounds: No murmur heard. No friction rub. No gallop.   Pulmonary:     Effort: Pulmonary effort is normal.     Breath sounds: No wheezing or rales.  Abdominal:     General: There is no distension.     Palpations: Abdomen is soft.     Tenderness: There is no abdominal tenderness.  Musculoskeletal:        General: No tenderness.     Cervical back: Normal range of motion and neck supple.     Comments: Skin tear to the left knee.   Skin:    General: Skin is warm and dry.  Neurological:     Mental Status: She is alert and oriented to person, place, and time.  Psychiatric:        Behavior: Behavior normal.     ED Results / Procedures / Treatments   Labs (all labs ordered are listed, but only abnormal results are displayed) Labs Reviewed - No data to display  EKG EKG Interpretation  Date/Time:  Friday August 29 2020 23:37:57 EST Ventricular Rate:  82 PR Interval:    QRS Duration: 91 QT Interval:  398 QTC Calculation: 465 R Axis:   -7 Text Interpretation: Sinus rhythm Low voltage, precordial leads Abnormal R-wave progression,  early transition No significant change since last tracing Confirmed by Deno Etienne [phone]) on 08/30/2020 12:19:24 AM   Radiology CT Head Wo Contrast  Result Date: 08/30/2020 CLINICAL DATA:  Head trauma. EXAM: CT HEAD WITHOUT CONTRAST TECHNIQUE: Contiguous axial images were obtained from the base of the skull through the vertex without intravenous contrast. COMPARISON:  CT head 09/10/2018, CT head 02/15/2012 FINDINGS: Brain: Cerebral ventricle sizes are concordant with the degree of cerebral volume loss. Patchy and confluent areas of decreased attenuation are noted throughout the deep and periventricular white matter of the cerebral hemispheres bilaterally, compatible with chronic microvascular ischemic disease. No evidence of large-territorial acute infarction. No parenchymal hemorrhage. No mass lesion. No extra-axial collection. No mass effect or midline shift. No hydrocephalus. Basilar cisterns are patent. Vascular: No hyperdense vessel. Atherosclerotic calcifications are present within the cavernous internal carotid arteries. Skull: No acute fracture or focal lesion. Hyperostosis frontalis interna. Sinuses/Orbits: Mucosal thickening of the left sphenoid sinus. Otherwise the visualized paranasal sinuses and mastoid air cells are clear. The orbits are unremarkable. Other: None. IMPRESSION: No acute intracranial abnormality. Electronically Signed   By: Iven Finn M.D.   On: 08/30/2020 00:08   DG Hip Unilat W or Wo Pelvis 2-3 Views Left  Result Date: 08/30/2020 CLINICAL DATA:  Syncope, fell, left hip pain EXAM: DG HIP (WITH OR WITHOUT PELVIS) 2-3V LEFT COMPARISON:  09/10/2018 FINDINGS: Frontal view of the pelvis as well as frontal and frogleg lateral views of the left hip are obtained. Left hip arthroplasty is in stable position, with no evidence of acute complication. The bones are osteopenic, with no evidence of acute fracture. Stable right hip arthroplasty, with asymmetric position of the femoral head  within the acetabular cup compatible with asymmetric wear of the plastic liner. Significant degenerative changes of the lower lumbar spine are stable. Soft tissues are unremarkable. IMPRESSION: 1. Stable bilateral hip arthroplasties. 2. Osteopenia.  No acute displaced fracture. Electronically Signed   By: Randa Ngo M.D.   On: 08/30/2020 00:18    Procedures Procedures   Medications Ordered in ED Medications  Tdap (BOOSTRIX) injection 0.5 mL (has no administration in time range)    ED Course  I have reviewed the triage vital signs and the nursing notes.  Pertinent labs & imaging results that were available during my care of the patient were reviewed by me and considered in my medical decision making (see chart for details).    MDM Rules/Calculators/A&P                          87 yo F with a cc of a syncopal event.  Hx of similar episodes usually preceded by emesis and diarrhea.  More concerned about her head injury.  CT head negative for acute intercranial bleeding.   Plain film of the hip without fx as viewed by me.   Steri-Strips applied by nursing.  Discharge home.  12:37 AM:  I have discussed the diagnosis/risks/treatment options with the patient and family and believe the pt to be eligible for discharge home to follow-up with PCP. We also discussed returning to the ED immediately if new or worsening sx occur. We discussed the sx which are most concerning (e.g., sudden worsening pain, fever, inability to tolerate by mouth) that necessitate immediate return. Medications administered to the patient during their visit and any new prescriptions provided to the patient are listed below.  Medications given during this visit Medications  Tdap (BOOSTRIX) injection 0.5 mL (has no administration in time range)     The patient appears reasonably screen and/or stabilized for discharge and I doubt any other medical condition or other [facility] requiring further screening, evaluation, or  treatment in the ED at this time prior to discharge.   Final Clinical Impression(s) / ED Diagnoses Final diagnoses:  Syncope and collapse  Closed head injury, initial encounter  Left hip pain    Rx / DC Orders ED Discharge Orders    None       Deno Etienne, DO 08/30/20 1225

## 2020-09-01 ENCOUNTER — Encounter: Payer: Self-pay | Admitting: Internal Medicine

## 2020-09-01 ENCOUNTER — Telehealth: Payer: Self-pay | Admitting: Internal Medicine

## 2020-09-01 NOTE — Telephone Encounter (Signed)
[facility] aunt was just discharged from the hospital Friday.Fall and hit her head, hip/pelvic, to xrays said they were fine. Released from ER. Is not on blood thinners. She went to the ER due to having a fall and has she has bad hip pain and she would like to speak to the dr about how to fix this. She would like to talk with RN vs waiting until Monday. Pain level: 8/10 standing, sitting 0/10  Advised to see PCP within 4 hours.   Neice refused to take patient to UC in 4 hours. Said they weren't going anywhere. Also after I suggested Motrin, neice said patient was already on Oxycodone. Neice wanted a list of excercises for patient to do. Explained she would need to follow up with PCP, so he could evaluate which excercises can be done so as not to aggravate current injury. Neice was upset patient would have to follow up and she would have to wait for doctor to prescribe excersizes tomorrow. Neice said patient could stand and walk but with assisstance

## 2020-09-02 ENCOUNTER — Inpatient Hospital Stay: Payer: Medicare Other | Admitting: Internal Medicine

## 2020-09-04 ENCOUNTER — Ambulatory Visit (INDEPENDENT_AMBULATORY_CARE_PROVIDER_SITE_OTHER): Payer: Medicare Other | Admitting: Internal Medicine

## 2020-09-04 ENCOUNTER — Other Ambulatory Visit: Payer: Self-pay

## 2020-09-04 ENCOUNTER — Encounter: Payer: Self-pay | Admitting: Internal Medicine

## 2020-09-04 VITALS — BP 150/88 | HR 87 | Temp 98.4°F

## 2020-09-04 DIAGNOSIS — I10 Essential (primary) hypertension: Secondary | ICD-10-CM | POA: Diagnosis not present

## 2020-09-04 DIAGNOSIS — T148XXA Other injury of unspecified body region, initial encounter: Secondary | ICD-10-CM

## 2020-09-04 DIAGNOSIS — L089 Local infection of the skin and subcutaneous tissue, unspecified: Secondary | ICD-10-CM | POA: Diagnosis not present

## 2020-09-04 NOTE — Progress Notes (Signed)
Subjective:  Patient ID: Stephanie Martinez, female    DOB: January 30, 1922  Age: 87 y.o. MRN: [id number]  CC: Follow-up (ER F/U)  This visit occurred during the SARS-CoV-2 public health emergency.  Safety protocols were in place, including screening questions prior to the visit, additional usage of staff PPE, and extensive cleaning of exam room while observing appropriate contact time as indicated for disinfecting solutions.    HPI Stephanie Martinez presents for f/up -   She recently had a fall - returns to have a left knee wound rechecked. The area is not red, painful, or swollen.  Outpatient Medications Prior to Visit  Medication Sig Dispense Refill  . acetaminophen (TYLENOL) 500 MG tablet Take 500 mg by mouth every 6 (six) hours as needed (pain.).     Marland Kitchen carvedilol (COREG) 12.5 MG tablet TAKE 1 TABLET BY MOUTH TWICE DAILY 180 tablet 1  . famotidine (PEPCID) 20 MG tablet TAKE 1 TABLET(20 MG) BY MOUTH TWICE DAILY 180 tablet 1  . latanoprost (XALATAN) 0.005 % ophthalmic solution Place 1 drop into both eyes at bedtime.     . Lutein 20 MG CAPS Take 20 mg by mouth daily before lunch.     No facility-administered medications prior to visit.    ROS Review of Systems  Constitutional: Negative.  Negative for chills, diaphoresis, fatigue and fever.  HENT: Negative.   Eyes: Negative.   Respiratory: Negative for cough, chest tightness, shortness of breath and wheezing.   Cardiovascular: Negative for chest pain, palpitations and leg swelling.  Gastrointestinal: Negative for abdominal pain, constipation, diarrhea, nausea and vomiting.  Endocrine: Negative.   Genitourinary: Negative.   Musculoskeletal: Positive for arthralgias.  Skin: Positive for wound.  Neurological: Negative.  Negative for weakness.  Hematological: Negative for adenopathy. Does not bruise/bleed easily.  Psychiatric/Behavioral: Negative.     Objective:  BP (!) 150/88 (BP Location: Right Leg)   Pulse 87   Temp 98.4  F (36.9 C) (Oral)   SpO2 97%   BP Readings from Last 3 Encounters:  09/04/20 (!) 150/88  08/30/20 (!) 178/111  05/27/20 (!) 144/90    Wt Readings from Last 3 Encounters:  08/29/20 110 lb (49.9 kg)  05/27/20 126 lb (57.2 kg)  12/13/19 121 lb (54.9 kg)    Physical Exam Vitals reviewed.  Constitutional:      Appearance: Normal appearance. She is ill-appearing (in a wheelchair).  HENT:     Nose: Nose normal.     Mouth/Throat:     Mouth: Mucous membranes are moist.  Eyes:     General: No scleral icterus.    Conjunctiva/sclera: Conjunctivae normal.  Cardiovascular:     Rate and Rhythm: Normal rate and regular rhythm.     Heart sounds: No murmur heard.   Pulmonary:     Effort: Pulmonary effort is normal.     Breath sounds: No stridor. No wheezing, rhonchi or rales.  Abdominal:     General: Abdomen is flat.     Palpations: There is no mass.     Tenderness: There is no abdominal tenderness. There is no guarding.  Musculoskeletal:        General: Normal range of motion.     Cervical back: Neck supple.       Legs:  Skin:    General: Skin is warm and dry.  Neurological:     General: No focal deficit present.     Mental Status: She is alert.  Psychiatric:  Mood and Affect: Mood normal.        Behavior: Behavior normal.     Lab Results  Component Value Date   WBC 8.3 05/27/2020   HGB 12.5 05/27/2020   HCT 37.2 05/27/2020   PLT 265.0 05/27/2020   GLUCOSE 89 05/27/2020   CHOL 193 12/05/2019   TRIG 202.0 (H) 12/05/2019   HDL 39.70 12/05/2019   LDLDIRECT 107.0 12/05/2019   LDLCALC 102 (H) 11/26/2014   ALT 8 12/05/2019   AST 15 12/05/2019   NA 138 05/27/2020   K 4.2 05/27/2020   CL 104 05/27/2020   CREATININE 1.71 (H) 05/27/2020   BUN 28 (H) 05/27/2020   CO2 26 05/27/2020   TSH 2.02 12/05/2019   INR 1.0 02/02/2019   HGBA1C 5.7 09/18/2012    CT Head Wo Contrast  Result Date: 08/30/2020 CLINICAL DATA:  Head trauma. EXAM: CT HEAD WITHOUT CONTRAST  TECHNIQUE: Contiguous axial images were obtained from the base of the skull through the vertex without intravenous contrast. COMPARISON:  CT head 09/10/2018, CT head 02/15/2012 FINDINGS: Brain: Cerebral ventricle sizes are concordant with the degree of cerebral volume loss. Patchy and confluent areas of decreased attenuation are noted throughout the deep and periventricular white matter of the cerebral hemispheres bilaterally, compatible with chronic microvascular ischemic disease. No evidence of large-territorial acute infarction. No parenchymal hemorrhage. No mass lesion. No extra-axial collection. No mass effect or midline shift. No hydrocephalus. Basilar cisterns are patent. Vascular: No hyperdense vessel. Atherosclerotic calcifications are present within the cavernous internal carotid arteries. Skull: No acute fracture or focal lesion. Hyperostosis frontalis interna. Sinuses/Orbits: Mucosal thickening of the left sphenoid sinus. Otherwise the visualized paranasal sinuses and mastoid air cells are clear. The orbits are unremarkable. Other: None. IMPRESSION: No acute intracranial abnormality. Electronically Signed   By: Iven Finn M.D.   On: 08/30/2020 00:08   DG Hip Unilat W or Wo Pelvis 2-3 Views Left  Result Date: 08/30/2020 CLINICAL DATA:  Syncope, fell, left hip pain EXAM: DG HIP (WITH OR WITHOUT PELVIS) 2-3V LEFT COMPARISON:  09/10/2018 FINDINGS: Frontal view of the pelvis as well as frontal and frogleg lateral views of the left hip are obtained. Left hip arthroplasty is in stable position, with no evidence of acute complication. The bones are osteopenic, with no evidence of acute fracture. Stable right hip arthroplasty, with asymmetric position of the femoral head within the acetabular cup compatible with asymmetric wear of the plastic liner. Significant degenerative changes of the lower lumbar spine are stable. Soft tissues are unremarkable. IMPRESSION: 1. Stable bilateral hip arthroplasties. 2.  Osteopenia.  No acute displaced fracture. Electronically Signed   By: Randa Ngo M.D.   On: 08/30/2020 00:18    Assessment & Plan:   Stephanie Martinez was seen today for follow-up.  Diagnoses and all orders for this visit:  Post-traumatic wound infection- The clx is positive for normal flora. Antibiotics are not indicated. -     WOUND CULTURE; Future -     Ambulatory referral to [place] hypertension, benign- Her BP is well controlled.   I am having Barnetta Chapel A. Gambrill maintain her latanoprost, acetaminophen, Lutein, carvedilol, and famotidine.  No orders of the defined types were placed in this encounter.    Follow-up: Return in about 3 months (around 12/05/2020).  Scarlette Calico, MD

## 2020-09-04 NOTE — Patient Instructions (Signed)
Wound Care, Adult Taking care of your wound properly can help to prevent pain, infection, and scarring. It can also help your wound heal more quickly. Follow instructions from your health care provider about how to care for your wound. Supplies needed:  Soap and water.  Wound cleanser.  Gauze.  If needed, a clean bandage (dressing) or other type of wound dressing material to cover or place in the wound. Follow your health care provider's instructions about what dressing supplies to use.  Cream or ointment to apply to the wound, if told by your health care provider. How to care for your wound Cleaning the wound Ask your health care provider how to clean the wound. This may include:  Using mild soap and water or a wound cleanser.  Using a clean gauze to pat the wound dry after cleaning it. Do not rub or scrub the wound. Dressing care  Wash your hands with soap and water for at least 20 seconds before and after you change the dressing. If soap and water are not available, use hand sanitizer.  Change your dressing as told by your health care provider. This may include: ? Cleaning or rinsing out (irrigating) the wound. ? Placing a dressing over the wound or in the wound (packing). ? Covering the wound with an outer dressing.  Leave any stitches (sutures), skin glue, or adhesive strips in place. These skin closures may need to stay in place for 2 weeks or longer. If adhesive strip edges start to loosen and curl up, you may trim the loose edges. Do not remove adhesive strips completely unless your health care provider tells you to do that.  Ask your health care provider when you can leave the wound uncovered. Checking for infection Check your wound area every day for signs of infection. Check for:  More redness, swelling, or pain.  Fluid or blood.  Warmth.  Pus or a bad smell.   Follow these instructions at home Medicines  If you were prescribed an antibiotic medicine, cream, or  ointment, take or apply it as told by your health care provider. Do not stop using the antibiotic even if your condition improves.  If you were prescribed pain medicine, take it 30 minutes before you do any wound care or as told by your health care provider.  Take over-the-counter and prescription medicines only as told by your health care provider. Eating and drinking  Eat a diet that includes protein, vitamin A, vitamin C, and other nutrient-rich foods to help the wound heal. ? Foods rich in protein include meat, fish, eggs, dairy, beans, and nuts. ? Foods rich in vitamin A include carrots and dark green, leafy vegetables. ? Foods rich in vitamin C include citrus fruits, tomatoes, broccoli, and peppers.  Drink enough fluid to keep your urine pale yellow. General instructions  Do not take baths, swim, use a hot tub, or do anything that would put the wound underwater until your health care provider approves. Ask your health care provider if you may take showers. You may only be allowed to take sponge baths.  Do not scratch or pick at the wound. Keep it covered as told by your health care provider.  Return to your normal activities as told by your health care provider. Ask your health care provider what activities are safe for you.  Protect your wound from the sun when you are outside for the first 6 months, or for as long as told by your health care provider. Cover   up the scar area or apply sunscreen that has an SPF of at least 30.  Do not use any products that contain nicotine or tobacco, such as cigarettes, e-cigarettes, and chewing tobacco. These may delay wound healing. If you need help quitting, ask your health care provider.  Keep all follow-up visits as told by your health care provider. This is important. Contact a health care provider if:  You received a tetanus shot and you have swelling, severe pain, redness, or bleeding at the injection site.  Your pain is not controlled  with medicine.  You have any of these signs of infection: ? More redness, swelling, or pain around the wound. ? Fluid or blood coming from the wound. ? Warmth coming from the wound. ? Pus or a bad smell coming from the wound. ? A fever or chills.  You are nauseous or you vomit.  You are dizzy. Get help right away if:  You have a red streak of skin near the area around your wound.  Your wound has been closed with staples, sutures, skin glue, or adhesive strips and it begins to open up and separate.  Your wound is bleeding, and the bleeding does not stop with gentle pressure.  You have a rash.  You faint.  You have trouble breathing. These symptoms may represent a serious problem that is an emergency. Do not wait to see if the symptoms will go away. Get medical help right away. Call your local emergency services (911 in the [place] Do not drive yourself to the hospital. Summary  Always wash your hands with soap and water for at least 20 seconds before and after changing your dressing.  Change your dressing as told by your health care provider.  To help with healing, eat foods that are rich in protein, vitamin A, vitamin C, and other nutrients.  Check your wound every day for signs of infection. Contact your health care provider if you suspect that your wound is infected. This information is not intended to replace advice given to you by your health care provider. Make sure you discuss any questions you have with your health care provider. Document Revised: 03/23/2019 Document Reviewed: 03/23/2019 [employer] Patient Education  2021 [employer]  

## 2020-09-07 LAB — WOUND CULTURE

## 2020-09-12 DIAGNOSIS — E0500 Thyrotoxicosis with diffuse goiter without thyrotoxic crisis or storm: Secondary | ICD-10-CM

## 2020-09-12 DIAGNOSIS — Z96643 Presence of artificial hip joint, bilateral: Secondary | ICD-10-CM

## 2020-09-12 DIAGNOSIS — M810 Age-related osteoporosis without current pathological fracture: Secondary | ICD-10-CM

## 2020-09-12 DIAGNOSIS — M85852 Other specified disorders of bone density and structure, left thigh: Secondary | ICD-10-CM

## 2020-09-12 DIAGNOSIS — M1990 Unspecified osteoarthritis, unspecified site: Secondary | ICD-10-CM

## 2020-09-12 DIAGNOSIS — Z87891 Personal history of nicotine dependence: Secondary | ICD-10-CM

## 2020-09-12 DIAGNOSIS — I129 Hypertensive chronic kidney disease with stage 1 through stage 4 chronic kidney disease, or unspecified chronic kidney disease: Secondary | ICD-10-CM

## 2020-09-12 DIAGNOSIS — H409 Unspecified glaucoma: Secondary | ICD-10-CM

## 2020-09-12 DIAGNOSIS — J309 Allergic rhinitis, unspecified: Secondary | ICD-10-CM

## 2020-09-12 DIAGNOSIS — Z85038 Personal history of other malignant neoplasm of large intestine: Secondary | ICD-10-CM

## 2020-09-12 DIAGNOSIS — Z9049 Acquired absence of other specified parts of digestive tract: Secondary | ICD-10-CM

## 2020-09-12 DIAGNOSIS — K589 Irritable bowel syndrome without diarrhea: Secondary | ICD-10-CM

## 2020-09-12 DIAGNOSIS — H8109 Meniere's disease, unspecified ear: Secondary | ICD-10-CM

## 2020-09-12 DIAGNOSIS — J449 Chronic obstructive pulmonary disease, unspecified: Secondary | ICD-10-CM

## 2020-09-12 DIAGNOSIS — K219 Gastro-esophageal reflux disease without esophagitis: Secondary | ICD-10-CM

## 2020-09-12 DIAGNOSIS — D631 Anemia in chronic kidney disease: Secondary | ICD-10-CM | POA: Diagnosis not present

## 2020-09-12 DIAGNOSIS — Z853 Personal history of malignant neoplasm of breast: Secondary | ICD-10-CM

## 2020-09-12 DIAGNOSIS — S81801D Unspecified open wound, right lower leg, subsequent encounter: Secondary | ICD-10-CM

## 2020-09-12 DIAGNOSIS — N183 Chronic kidney disease, stage 3 unspecified: Secondary | ICD-10-CM | POA: Diagnosis not present

## 2020-09-12 DIAGNOSIS — D500 Iron deficiency anemia secondary to blood loss (chronic): Secondary | ICD-10-CM

## 2020-09-12 DIAGNOSIS — Z9181 History of falling: Secondary | ICD-10-CM

## 2020-09-12 DIAGNOSIS — E7800 Pure hypercholesterolemia, unspecified: Secondary | ICD-10-CM

## 2020-09-12 DIAGNOSIS — M47816 Spondylosis without myelopathy or radiculopathy, lumbar region: Secondary | ICD-10-CM

## 2020-10-08 ENCOUNTER — Other Ambulatory Visit: Payer: Self-pay | Admitting: Internal Medicine

## 2020-10-08 DIAGNOSIS — I10 Essential (primary) hypertension: Secondary | ICD-10-CM

## 2020-10-23 ENCOUNTER — Encounter (HOSPITAL_BASED_OUTPATIENT_CLINIC_OR_DEPARTMENT_OTHER): Payer: Self-pay | Admitting: Obstetrics and Gynecology

## 2020-10-23 ENCOUNTER — Emergency Department (HOSPITAL_BASED_OUTPATIENT_CLINIC_OR_DEPARTMENT_OTHER): Payer: Medicare Other

## 2020-10-23 ENCOUNTER — Telehealth: Payer: Self-pay

## 2020-10-23 ENCOUNTER — Emergency Department (HOSPITAL_BASED_OUTPATIENT_CLINIC_OR_DEPARTMENT_OTHER)
Admission: EM | Admit: 2020-10-23 | Discharge: 2020-10-23 | Disposition: A | Discharge status: Home / Self Care | Payer: Medicare Other | Attending: Emergency Medicine | Admitting: Emergency Medicine

## 2020-10-23 ENCOUNTER — Other Ambulatory Visit: Payer: Self-pay

## 2020-10-23 DIAGNOSIS — N183 Chronic kidney disease, stage 3 unspecified: Secondary | ICD-10-CM | POA: Diagnosis not present

## 2020-10-23 DIAGNOSIS — R197 Diarrhea, unspecified: Secondary | ICD-10-CM | POA: Diagnosis not present

## 2020-10-23 DIAGNOSIS — J449 Chronic obstructive pulmonary disease, unspecified: Secondary | ICD-10-CM | POA: Diagnosis not present

## 2020-10-23 DIAGNOSIS — R109 Unspecified abdominal pain: Secondary | ICD-10-CM | POA: Diagnosis present

## 2020-10-23 DIAGNOSIS — Z79899 Other long term (current) drug therapy: Secondary | ICD-10-CM | POA: Diagnosis not present

## 2020-10-23 DIAGNOSIS — I129 Hypertensive chronic kidney disease with stage 1 through stage 4 chronic kidney disease, or unspecified chronic kidney disease: Secondary | ICD-10-CM | POA: Diagnosis not present

## 2020-10-23 DIAGNOSIS — Z85038 Personal history of other malignant neoplasm of large intestine: Secondary | ICD-10-CM | POA: Insufficient documentation

## 2020-10-23 DIAGNOSIS — K219 Gastro-esophageal reflux disease without esophagitis: Secondary | ICD-10-CM | POA: Diagnosis not present

## 2020-10-23 DIAGNOSIS — Z87891 Personal history of nicotine dependence: Secondary | ICD-10-CM | POA: Diagnosis not present

## 2020-10-23 DIAGNOSIS — M545 Low back pain, unspecified: Secondary | ICD-10-CM | POA: Diagnosis not present

## 2020-10-23 DIAGNOSIS — R42 Dizziness and giddiness: Secondary | ICD-10-CM | POA: Insufficient documentation

## 2020-10-23 DIAGNOSIS — R110 Nausea: Secondary | ICD-10-CM | POA: Diagnosis not present

## 2020-10-23 DIAGNOSIS — Z96643 Presence of artificial hip joint, bilateral: Secondary | ICD-10-CM | POA: Diagnosis not present

## 2020-10-23 DIAGNOSIS — Z853 Personal history of malignant neoplasm of breast: Secondary | ICD-10-CM | POA: Diagnosis not present

## 2020-10-23 DIAGNOSIS — K5900 Constipation, unspecified: Secondary | ICD-10-CM | POA: Insufficient documentation

## 2020-10-23 LAB — CBC WITH DIFFERENTIAL/PLATELET
Abs Immature Granulocytes: 0.03 10*3/uL (ref 0.00–0.07)
Basophils Absolute: 0.1 10*3/uL (ref 0.0–0.1)
Basophils Relative: 1 %
Eosinophils Absolute: 0.4 10*3/uL (ref 0.0–0.5)
Eosinophils Relative: 4 %
HCT: 35.1 % — ABNORMAL LOW (ref 36.0–46.0)
Hemoglobin: 11.5 g/dL — ABNORMAL LOW (ref 12.0–15.0)
Immature Granulocytes: 0 %
Lymphocytes Relative: 7 %
Lymphs Abs: 0.6 10*3/uL — ABNORMAL LOW (ref 0.7–4.0)
MCH: 31.1 pg (ref 26.0–34.0)
MCHC: 32.8 g/dL (ref 30.0–36.0)
MCV: 94.9 fL (ref 80.0–100.0)
Monocytes Absolute: 0.6 10*3/uL (ref 0.1–1.0)
Monocytes Relative: 6 %
Neutro Abs: 7.9 10*3/uL — ABNORMAL HIGH (ref 1.7–7.7)
Neutrophils Relative %: 82 %
Platelets: 294 10*3/uL (ref 150–400)
RBC: 3.7 MIL/uL — ABNORMAL LOW (ref 3.87–5.11)
RDW: 12.9 % (ref 11.5–15.5)
WBC: 9.6 10*3/uL (ref 4.0–10.5)
nRBC: 0 % (ref 0.0–0.2)

## 2020-10-23 LAB — COMPREHENSIVE METABOLIC PANEL
ALT: <5 U/L (ref 0–44)
AST: 20 U/L (ref 15–41)
Albumin: 3.7 g/dL (ref 3.5–5.0)
Alkaline Phosphatase: 59 U/L (ref 38–126)
Anion gap: 10 (ref 5–15)
BUN: 32 mg/dL — ABNORMAL HIGH (ref 8–23)
CO2: 22 mmol/L (ref 22–32)
Calcium: 9 mg/dL (ref 8.9–10.3)
Chloride: 102 mmol/L (ref 98–111)
Creatinine, Ser: 1.76 mg/dL — ABNORMAL HIGH (ref 0.44–1.00)
GFR, Estimated: 26 mL/min — ABNORMAL LOW (ref >60)
Glucose, Bld: 109 mg/dL — ABNORMAL HIGH (ref 70–99)
Potassium: 4.6 mmol/L (ref 3.5–5.1)
Sodium: 134 mmol/L — ABNORMAL LOW (ref 135–145)
Total Bilirubin: 0.5 mg/dL (ref 0.3–1.2)
Total Protein: 6.8 g/dL (ref 6.5–8.1)

## 2020-10-23 LAB — TROPONIN I (HIGH SENSITIVITY)
Troponin I (High Sensitivity): 8 ng/L (ref <18)
Troponin I (High Sensitivity): 9 ng/L (ref <18)

## 2020-10-23 MED ORDER — OXYCODONE HCL 5 MG PO TABS: 5.0000 mg | ORAL_TABLET | Freq: Four times a day (QID) | ORAL | 0 refills | Status: AC | PRN

## 2020-10-23 MED ORDER — LISINOPRIL 10 MG PO TABS
10.0000 mg | ORAL_TABLET | Freq: Once | ORAL | Status: AC
  Administered 2020-10-23: 10 mg via ORAL
  Filled 2020-10-23: qty 1

## 2020-10-23 MED ORDER — LISINOPRIL 10 MG PO TABS: 10.0000 mg | ORAL_TABLET | Freq: Every day | ORAL | 1 refills | Status: AC

## 2020-10-23 MED ORDER — HYDRALAZINE HCL 20 MG/ML IJ SOLN
5.0000 mg | Freq: Once | INTRAMUSCULAR | Status: AC
  Administered 2020-10-23: 5 mg via INTRAVENOUS
  Filled 2020-10-23: qty 1

## 2020-10-23 MED ORDER — ONDANSETRON HCL 4 MG/2ML IJ SOLN
4.0000 mg | Freq: Once | INTRAMUSCULAR | Status: AC
  Administered 2020-10-23: 4 mg via INTRAVENOUS

## 2020-10-23 MED ORDER — SENNOSIDES 8.6 MG PO TABS: 1.0000 | ORAL_TABLET | Freq: Every day | ORAL | 0 refills | Status: AC

## 2020-10-23 NOTE — Telephone Encounter (Signed)
Nurse with [facility] calling to report patient's BP is 210/120. Patient also has symptoms of headache, dizziness with standing, SOB, and a nonproductive cough. Advised nurse that per Dr. Ronnald Ramp, patient needs to be taken to the ED. Nurse states that she will call with any updates.

## 2020-10-23 NOTE — Discharge Instructions (Signed)
Stephanie Martinez seen in emergency department for high blood pressure and abdominal pain today.  Her work-up included a CT scan of her abdomen.  This was concerning for cancer.  She has enlarged lymph nodes in her abdomen, as well as spots on her spine, which may be signs of metastatic or spreading cancer.  I gave you an office number for the oncology group to schedule a follow-up or screening appointment.  She can continue on Tylenol and Motrin at home as needed for pain.  Prescribed some stronger pain medications, oxycodone, as well as Senokot, a stool softener and laxative to take with this to prevent constipation.  Please schedule follow-up appointment with her primary care physician next week regarding her blood pressure.  I started her on lisinopril, a new medication to help keep her blood pressure under better control.  Pain can make the blood pressure go high.

## 2020-10-23 NOTE — ED Triage Notes (Signed)
Patient reports to the ER for HTN. Patient has a hx of HTN but the homehealth nurse reported it was high today (210's/110's). Patient reports a slight headache.

## 2020-10-23 NOTE — ED Provider Notes (Signed)
[facility] EMERGENCY DEPT Provider Note   CSN: [id number] Arrival date & time: 10/23/20  1458     History Chief Complaint  Patient presents with  . Hypertension    Stephanie Martinez is a 87 y.o. female with a history of hypertension, presented to emergency department in company of her daughter with concern for high blood pressure.  Daughter reports that they have home health nurse coming out to change the patient's dressing for a wound on her left knee from a prior fall.  Today the home health nurse noted that the patient's blood pressure was very high, the daughter cannot recall the exact numbers.  The patient takes Coreg 12.5 mg twice a day, and has been compliant with this medicine.  Her daughter reports the patient has had fecal blood pressure, but typically her blood pressures at home will run around 119-417 mmhg systolic.  The patient is in pain or around doctors, her blood pressure can be significantly more elevated.  The patient was last seen in the ED in March of this year after a fall, and did have blood pressure around 408 systolic at that time, as well as CT scan of the head which showed no significant old infarcts.  Patient self is minimal complaints.  She is hard of hearing.  She denies headache, chest pain, persistent lightheadedness or dizziness.  She is at her baseline state.  She has allergies to iodine contrast and amlodipine.  Her daughter reports the patient intermittently has constipation and diarrhea and has had irritable bowel issues.  She is currently been constipated for 2 to 3 days.  She will sometimes have abdominal cramping.  She frequently complains of sharp abdominal pains and lower back pains, sometimes with nausea.  HPI     Past Medical History:  Diagnosis Date  . Anemia   . Arthritis   . Breast cancer ([place])    lumpectomy and radiation  . CKD (chronic kidney disease), stage III ([place])   . Colon cancer ([place])    resection  . Family  history of breast cancer 04/17/2020  . Family history of colon cancer 04/17/2020  . GERD (gastroesophageal reflux disease)   . Glaucoma   . History of colon cancer 04/17/2020  . Hyperlipidemia   . Hypertension   . Meniere disease   . Osteoporosis   . Overactive thyroid gland    treated with liquid iodine  . Personal history of radiation therapy   . Vertigo    doing PT     Patient Active Problem List   Diagnosis Date Noted  . Post-traumatic wound infection 09/04/2020  . Large mass of left breast 05/27/2020  . Flu vaccine need 05/27/2020  . Personal history of malignant neoplasm of breast 04/17/2020  . Iron deficiency anemia due to chronic blood loss 12/18/2019  . Hypertension   . Osteoporosis   . IBS (irritable bowel syndrome) 09/20/2018  . Routine general medical examination at a health care facility 09/06/2018  . Seizures ([place]) 07/22/2015  . Upper airway cough syndrome 05/23/2015  . Vitamin D deficiency 12/05/2014  . CKD (chronic kidney disease), stage III ([place])   . Allergic rhinitis 01/22/2013  . COPD mixed type ([place]) 01/05/2013  . Other abnormal glucose 09/18/2012  . Spondylosis of lumbar region without myelopathy or radiculopathy 07/27/2012  . Low back pain 04/26/2012  . Meniere disease   . GERD (gastroesophageal reflux disease)   . Osteoarthritis of hip   . Breast cancer ([place])   . Eczema  12/29/2011  . Essential hypertension, benign 08/27/2011  . Other screening mammogram 08/27/2011  . Pure hypercholesterolemia 08/27/2011  . Glaucoma 08/27/2011    Past Surgical History:  Procedure Laterality Date  . APPENDECTOMY    . BREAST BIOPSY Right   . BREAST BIOPSY Left   . BREAST LUMPECTOMY Left   . BREAST SURGERY     lumpectomy-left  . ORIF HUMERAL SHAFT FRACTURE Left 02/02/2019  . ORIF HUMERUS FRACTURE Right 11/26/2014   Procedure: OPEN REDUCTION INTERNAL FIXATION (ORIF) HUMERAL SHAFT FRACTURE;  Surgeon: Altamese [place], MD;  Location: [facility];  Service: Orthopedics;   Laterality: Right;  . ORIF HUMERUS FRACTURE Left 02/02/2019   Procedure: OPEN REDUCTION INTERNAL FIXATION (ORIF) LEFT HUMERAL SHAFT NONUNION;  Surgeon: Altamese [place], MD;  Location: [facility];  Service: Orthopedics;  Laterality: Left;  . SMALL INTESTINE SURGERY     resection  . SPINE SURGERY     x2  . TONSILLECTOMY AND ADENOIDECTOMY  before age 75  . TOTAL HIP ARTHROPLASTY Bilateral    bilateral one 20 years ago, other 8-10 years ago     OB History    Gravida  2   Para  2   Term      Preterm      AB      Living  2     SAB      IAB      Ectopic      Multiple      Live Births           Obstetric Comments  ? If had MAB        Family History  Problem Relation Age of Onset  . Stroke Mother   . Colon cancer Father        dx in 16s  . Lung cancer Sister 73  . BRCA 1/2 Daughter        BRCA2 positive  . BRCA 1/2 Daughter        BRCA2 positive  . Breast cancer Sister 34       bilateral breast cancer, 2nd dx at 36  . Colon cancer Sister 49  . Breast cancer Sister 19       bilateral breast cancer; 2nd dx age 41  . Leukemia Other        dx in his 75s, youngest sister's son  . Hypertension Other        materal side  . Breast cancer Paternal Aunt        <50  . Thyroid disease Daughter   . Heart disease Neg Hx   . Hyperlipidemia Neg Hx     Social History   Tobacco Use  . Smoking status: Former Smoker    Years: 35.00    Types: Cigarettes  . Smokeless tobacco: Never Used  . Tobacco comment: in her early 30s  Vaping Use  . Vaping Use: Never used  Substance Use Topics  . Alcohol use: Yes    Alcohol/week: 5.0 - 6.0 standard drinks    Types: 5 - 6 Standard drinks or equivalent per week    Comment: Wine  . Drug use: No    Home Medications Prior to Admission medications   Medication Sig Start Date End Date Taking? Authorizing Provider  lisinopril (ZESTRIL) 10 MG tablet Take 1 tablet (10 mg total) by mouth daily for 30 doses. 10/23/20 11/22/20 Yes Maxcine Strong,  Carola Rhine, MD  oxyCODONE (ROXICODONE) 5 MG immediate release tablet Take 1 tablet (5 mg total) by  mouth every 6 (six) hours as needed for up to 15 days for severe pain. 10/23/20 11/07/20 Yes Roena Sassaman, Carola Rhine, MD  senna (SENOKOT) 8.6 MG tablet Take 1 tablet (8.6 mg total) by mouth daily for 30 doses. Take with oxicodone (opioid) to prevent constipation 10/23/20 11/22/20 Yes Spence Soberano, Carola Rhine, MD  acetaminophen (TYLENOL) 500 MG tablet Take 500 mg by mouth every 6 (six) hours as needed (pain.).     [provider]  carvedilol (COREG) 12.5 MG tablet TAKE 1 TABLET BY MOUTH TWICE DAILY 10/08/20   Janith Lima, MD  famotidine (PEPCID) 20 MG tablet TAKE 1 TABLET(20 MG) BY MOUTH TWICE DAILY 07/10/20   Janith Lima, MD  latanoprost (XALATAN) 0.005 % ophthalmic solution Place 1 drop into both eyes at bedtime.     [provider]  Lutein 20 MG CAPS Take 20 mg by mouth daily before lunch.    [provider]    Allergies    Iodine and Amlodipine  Review of Systems   Review of Systems  Constitutional: Negative for chills and fever.  HENT: Negative for ear pain and sore throat.   Eyes: Negative for pain and visual disturbance.  Respiratory: Negative for cough and shortness of breath.   Cardiovascular: Negative for chest pain and palpitations.  Gastrointestinal: Positive for abdominal pain and nausea. Negative for vomiting.  Musculoskeletal: Negative for arthralgias and back pain.  Skin: Negative for color change and rash.  Neurological: Negative for syncope, light-headedness and headaches.  All other systems reviewed and are negative.   Physical Exam Updated Vital Signs BP (!) 154/109   Pulse 97   Temp 98 F (36.7 C) (Oral)   Resp (!) 21   Ht $R'5\' 2"'SJ$  (1.575 m)   Wt 58.1 kg   SpO2 93%   BMI 23.41 kg/m   Physical Exam Constitutional:      General: She is not in acute distress.    Comments: Hard of hearing  HENT:     Head: Normocephalic and atraumatic.  Eyes:      Conjunctiva/sclera: Conjunctivae normal.     Pupils: Pupils are equal, round, and reactive to light.  Cardiovascular:     Rate and Rhythm: Normal rate and regular rhythm.  Pulmonary:     Effort: Pulmonary effort is normal. No respiratory distress.  Skin:    General: Skin is warm and dry.     Comments: Left knee wound well healed  Neurological:     General: No focal deficit present.     Mental Status: She is alert and oriented to person, place, and time. Mental status is at baseline.     ED Results / Procedures / Treatments   Labs (all labs ordered are listed, but only abnormal results are displayed) Labs Reviewed  COMPREHENSIVE METABOLIC PANEL - Abnormal; Notable for the following components:      Result Value   Sodium 134 (*)    Glucose, Bld 109 (*)    BUN 32 (*)    Creatinine, Ser 1.76 (*)    GFR, Estimated 26 (*)    All other components within normal limits  CBC WITH DIFFERENTIAL/PLATELET - Abnormal; Notable for the following components:   RBC 3.70 (*)    Hemoglobin 11.5 (*)    HCT 35.1 (*)    Neutro Abs 7.9 (*)    Lymphs Abs 0.6 (*)    All other components within normal limits  TROPONIN I (HIGH SENSITIVITY)  TROPONIN I (HIGH SENSITIVITY)  EKG EKG Interpretation  Date/Time:  Thursday Oct 23 2020 17:01:06 EDT Ventricular Rate:  81 PR Interval:  186 QRS Duration: 90 QT Interval:  388 QTC Calculation: 451 R Axis:   15 Text Interpretation: Sinus rhythm Low voltage, precordial leads Confirmed by Octaviano Glow [phone]) on 10/23/2020 5:19:58 PM   Radiology CT ABDOMEN PELVIS WO CONTRAST  Result Date: 10/23/2020 CLINICAL DATA:  Abdominal pain, constipation for 3 days, question bowel obstruction. History of colon cancer, breast cancer, GERD, hypertension EXAM: CT ABDOMEN AND PELVIS WITHOUT CONTRAST TECHNIQUE: Multidetector CT imaging of the abdomen and pelvis was performed following the standard protocol without IV contrast. Sagittal and coronal MPR images reconstructed  from axial data set. No oral contrast administered. COMPARISON:  None FINDINGS: Lower chest: Small LEFT pleural effusion and basilar atelectasis. Small pericardial effusion. Mitral annular calcification. Hepatobiliary: 2 small hepatic cysts larger 18 x 13 mm. Gallbladder and remainder of liver unremarkable. Pancreas: Cystic lesion at pancreatic head/uncinate process 2.5 x 2.3 x 2.3 cm. Remainder of pancreas normal. Spleen: Normal appearance Adrenals/Urinary Tract: RIGHT adrenal mass 2.8 x 2.1 cm, intermediate attenuation, nonspecific. LEFT adrenal gland normal. BILATERAL renal cortical atrophy. Small BILATERAL renal cysts. Hyperdense lesion mid RIGHT kidney 9 mm diameter, could represent hyperdense/hemorrhagic cyst or solid nodule. No hydronephrosis, proximal hydroureter or ureteral calcifications. Bladder and distal ureters obscured by beam hardening artifacts in pelvis Stomach/Bowel: Prior RIGHT ileocolic resection. Large and small bowel loops otherwise unremarkable. No evidence of bowel dilatation or bowel obstruction. Stomach decompressed, with suboptimal assessment of gastric wall thickness. Vascular/Lymphatic: Atherosclerotic calcifications aorta, iliac arteries, coronary arteries, visceral arteries. Aorta normal caliber. Abnormal soft tissue density in retroperitoneum, para-aortic, aortocaval, retrocaval, likely confluent adenopathy. Largest discrete nodes measure 20 mm LEFT para-aortic image 24 and 18 mm retrocaval image 30. Additional enlarged mesenteric nodes. Reproductive: Calcified uterine leiomyoma 18 mm diameter. Remainder of uterus obscured by beam hardening artifacts. Small cystic lesion 19 mm diameter with mural calcification RIGHT ovary, with RIGHT ovary appearing mildly enlarged. Other: Small umbilical and periumbilical hernias containing fat. Minimal perisplenic ascites. Fluid/thickening at LEFT lateral conal fascia and in pelvis. No free air. Musculoskeletal: Osseous demineralization. BILATERAL  hip prostheses. Degenerative disc and facet disease changes lumbar spine. Small lytic lesions within L1 and L5 vertebral bodies. IMPRESSION: Prior RIGHT ileocolic resection. Extensive retroperitoneal and mesenteric adenopathy highly suspicious for for either metastatic disease or lymphoma. RIGHT adrenal mass 2.8 x 2.1 cm, nonspecific, cannot exclude metastasis. Cystic lesion at pancreatic head/uncinate process 2.5 x 2.3 x 2.3 cm; based on patient age and comorbidities, no follow-up imaging recommended. Small LEFT pleural effusion and basilar atelectasis. Small pericardial effusion. Minimal ascites. Small umbilical and periumbilical hernias containing fat. Hyperdense/hemorrhagic cyst or solid nodule mid RIGHT kidney 9 mm diameter. Small lytic lesions within L1 and L5 vertebral bodies, cannot exclude osseous metastases. Aortic Atherosclerosis (ICD10-I70.0). Electronically Signed   By: Lavonia Dana M.D.   On: 10/23/2020 20:47   CT Head Wo Contrast  Result Date: 10/23/2020 CLINICAL DATA:  Dizziness EXAM: CT HEAD WITHOUT CONTRAST TECHNIQUE: Contiguous axial images were obtained from the base of the skull through the vertex without intravenous contrast. COMPARISON:  08/29/2020 FINDINGS: Brain: There is no acute intracranial hemorrhage, mass effect, or edema. Gray-white differentiation is preserved. There is no extra-axial fluid collection. Patchy confluent areas of hypoattenuation in the supratentorial white matter likely reflects stable chronic microvascular ischemic changes. Prominence of the ventricles and sulci reflects stable parenchymal volume loss. Small chronic left cerebellar infarct. Possible chronic bilateral subinsular  and basal ganglia infarcts. Vascular: There is atherosclerotic calcification at the skull base. Skull: Calvarium is unremarkable. Sinuses/Orbits: No acute finding. Other: None. IMPRESSION: No acute intracranial hemorrhage, edema, or evidence of acute infarction. No significant change since  recent prior study. Electronically Signed   By: Macy Mis M.D.   On: 10/23/2020 18:02    Procedures Procedures   Medications Ordered in ED Medications  lisinopril (ZESTRIL) tablet 10 mg (10 mg Oral Given 10/23/20 1645)  hydrALAZINE (APRESOLINE) injection 5 mg (5 mg Intravenous Given 10/23/20 1735)  ondansetron (ZOFRAN) injection 4 mg (4 mg Intravenous Given 10/23/20 1820)    ED Course  I have reviewed the triage vital signs and the nursing notes.  Pertinent labs & imaging results that were available during my care of the patient were reviewed by me and considered in my medical decision making (see chart for details).   87 year old patient here with asymptomatic hypertension.  This may be related to stress, pain or anxiety, she does have a history of whitecoat hypertension.  She has no active chest pain, headache, neurodeficits suggest hypertensive emergency.  Triage labs ordered and reviewed personally.  Creatinine is near baseline.  All of her labs are near baseline.  She is otherwise well-appearing.  I discussed starting the patient on lisinopril 10 mg at this time, and having her daughter monitor and titrate this medication at home depending on her daily blood pressures.  Ultimately explained that this should be followed up with her primary care physician, who can better assess whether the patient needs to be on long-term additional blood pressure medications.  They verbalized understanding.  Anticipate discharge shortly.  Clinical Course as of 10/24/20 0013  Thu Oct 23, 2020  1621 CMP and Cr near baseline levels.  Hgb also near baseline [MT]  1624 BP 210/157mmhg during my exam. [MT]  1652 Patient became lightheaded and dizzy with blood pressure reaching 416 systolic.  I have asked for 5 mg of IV hydralazine to be given in addition to 10 mg of p.o. lisinopril she wishes given.  I have also asked for an EKG and a troponin, and will obtain a CT scan at this point. [MT]  3845 BP 192/84 [MT]   1929 My reassessment, daughter clarifies that the patient did not have vomiting but was dry heaving earlier, complaining of abdominal pain.  Suspect this correlates with the elevation in her blood pressure due to pain.  Currently BP is 160/80.  She has some mild diffuse belly tenderness.  We will obtain a CT scan without contrast given her iodine allergy is to evaluate for obstruction versus constipation.  If this is unremarkable anticipate a possible discharge home.  CT scan of the head was reviewed and unremarkable.  I doubt this is a hemorrhagic stroke. [MT]  2141 Reviewed this patient's CT scan with the patient's daughter.  I am concerned about cancer.  Appears to have enlarged lymph nodes as well as a lesion in her spine, and these appear to be the source of the patient's ongoing pain.  Her daughter is aware of this and is not sure how much they want to pursue this in terms of medical work-up.  She would appreciate information for an oncologist for an initial evaluation.  We discussed pain control at home.  They can continue with Tylenol and Motrin, however will prescribe some opiates along with Senokot for breakthrough pain.  I discussed the likelihood of these medicines causing constipation and she understands this.  Otherwise the  patient was able to tolerate some food applesauce in Ed, and they are comfortable going home. [MT]    Clinical Course User Index [MT] Wyvonnia Dusky, MD    Final Clinical Impression(s) / ED Diagnoses Final diagnoses:  Abdominal pain, unspecified abdominal location    Rx / DC Orders ED Discharge Orders         Ordered    oxyCODONE (ROXICODONE) 5 MG immediate release tablet  Every 6 hours PRN        10/23/20 2146    senna (SENOKOT) 8.6 MG tablet  Daily        10/23/20 2146    lisinopril (ZESTRIL) 10 MG tablet  Daily        10/23/20 2146           Wyvonnia Dusky, MD 10/24/20 [phone]

## 2020-10-24 ENCOUNTER — Telehealth: Payer: Self-pay | Admitting: Hematology

## 2020-10-24 NOTE — Progress Notes (Incomplete)
[place]   [phone] Fax:([phone]) [place] Note   Patient Care Team: Janith Lima, MD as PCP - General (Internal Medicine)  Date of Service:  10/24/2020   CHIEF COMPLAINTS/PURPOSE OF CONSULTATION:  ***  REFERRING PHYSICIAN:  ***  Oncology History   No history exists.     HISTORY OF PRESENTING ILLNESS: *** Stephanie Martinez 87 y.o. female is a here because of ***. The patient was referred by ***. The patient presents to the clinic today accompanied by ***.   {Story of what lead them here}   Today the patient notes they felt/feeling prior/after...   {She/He} has a PMHx of....    Socially...    REVIEW OF SYSTEMS:   *** Constitutional: Denies fevers, chills or abnormal night sweats Eyes: Denies blurriness of vision, double vision or watery eyes Ears, nose, mouth, throat, and face: Denies mucositis or sore throat Respiratory: Denies cough, dyspnea or wheezes Cardiovascular: Denies palpitation, chest discomfort or lower extremity swelling Gastrointestinal:  Denies nausea, heartburn or change in bowel habits Skin: Denies abnormal skin rashes Lymphatics: Denies new lymphadenopathy or easy bruising Neurological:Denies numbness, tingling or new weaknesses Behavioral/Psych: Mood is stable, no new changes  All other systems were reviewed with the patient and are negative.   MEDICAL HISTORY:  Past Medical History:  Diagnosis Date  . Anemia   . Arthritis   . Breast cancer ([place])    lumpectomy and radiation  . CKD (chronic kidney disease), stage III ([place])   . Colon cancer ([place])    resection  . Family history of breast cancer 04/17/2020  . Family history of colon cancer 04/17/2020  . GERD (gastroesophageal reflux disease)   . Glaucoma   . History of colon cancer 04/17/2020  . Hyperlipidemia   . Hypertension   . Meniere disease   . Osteoporosis   . Overactive thyroid gland    treated with liquid iodine  .  Personal history of radiation therapy   . Vertigo    doing PT     SURGICAL HISTORY: Past Surgical History:  Procedure Laterality Date  . APPENDECTOMY    . BREAST BIOPSY Right   . BREAST BIOPSY Left   . BREAST LUMPECTOMY Left   . BREAST SURGERY     lumpectomy-left  . ORIF HUMERAL SHAFT FRACTURE Left 02/02/2019  . ORIF HUMERUS FRACTURE Right 11/26/2014   Procedure: OPEN REDUCTION INTERNAL FIXATION (ORIF) HUMERAL SHAFT FRACTURE;  Surgeon: Altamese Santa Venetia, MD;  Location: [facility];  Service: Orthopedics;  Laterality: Right;  . ORIF HUMERUS FRACTURE Left 02/02/2019   Procedure: OPEN REDUCTION INTERNAL FIXATION (ORIF) LEFT HUMERAL SHAFT NONUNION;  Surgeon: Altamese Kokomo, MD;  Location: [facility];  Service: Orthopedics;  Laterality: Left;  . SMALL INTESTINE SURGERY     resection  . SPINE SURGERY     x2  . TONSILLECTOMY AND ADENOIDECTOMY  before age 69  . TOTAL HIP ARTHROPLASTY Bilateral    bilateral one 20 years ago, other 8-10 years ago    SOCIAL HISTORY: Social History   Socioeconomic History  . Marital status: Widowed    Spouse name: Not on file  . Number of children: 2  . Years of education: Not on file  . Highest education level: Not on file  Occupational History  . Not on file  Tobacco Use  . Smoking status: Former Smoker    Years: 35.00    Types: Cigarettes  . Smokeless tobacco: Never Used  . Tobacco  comment: in her early 97s  Vaping Use  . Vaping Use: Never used  Substance and Sexual Activity  . Alcohol use: Yes    Alcohol/week: 5.0 - 6.0 standard drinks    Types: 5 - 6 Standard drinks or equivalent per week    Comment: Wine  . Drug use: No  . Sexual activity: Never    Partners: Male  Other Topics Concern  . Not on file  Social History Narrative  . Not on file   Social Determinants of Health   Financial Resource Strain: Not on file  Food Insecurity: Not on file  Transportation Needs: Not on file  Physical Activity: Not on file  Stress: Not on file  Social  Connections: Not on file  Intimate Partner Violence: Not on file    FAMILY HISTORY: Family History  Problem Relation Age of Onset  . Stroke Mother   . Colon cancer Father        dx in 39s  . Lung cancer Sister 76  . BRCA 1/2 Daughter        BRCA2 positive  . BRCA 1/2 Daughter        BRCA2 positive  . Breast cancer Sister 37       bilateral breast cancer, 2nd dx at 46  . Colon cancer Sister 75  . Breast cancer Sister 20       bilateral breast cancer; 2nd dx age 74  . Leukemia Other        dx in his 35s, youngest sister's son  . Hypertension Other        materal side  . Breast cancer Paternal Aunt        <50  . Thyroid disease Daughter   . Heart disease Neg Hx   . Hyperlipidemia Neg Hx     ALLERGIES:  is allergic to iodine and amlodipine.  MEDICATIONS:  Current Outpatient Medications  Medication Sig Dispense Refill  . acetaminophen (TYLENOL) 500 MG tablet Take 500 mg by mouth every 6 (six) hours as needed (pain.).     Marland Kitchen carvedilol (COREG) 12.5 MG tablet TAKE 1 TABLET BY MOUTH TWICE DAILY 180 tablet 1  . famotidine (PEPCID) 20 MG tablet TAKE 1 TABLET(20 MG) BY MOUTH TWICE DAILY 180 tablet 1  . latanoprost (XALATAN) 0.005 % ophthalmic solution Place 1 drop into both eyes at bedtime.     Marland Kitchen lisinopril (ZESTRIL) 10 MG tablet Take 1 tablet (10 mg total) by mouth daily for 30 doses. 30 tablet 1  . Lutein 20 MG CAPS Take 20 mg by mouth daily before lunch.    . oxyCODONE (ROXICODONE) 5 MG immediate release tablet Take 1 tablet (5 mg total) by mouth every 6 (six) hours as needed for up to 15 days for severe pain. 15 tablet 0  . senna (SENOKOT) 8.6 MG tablet Take 1 tablet (8.6 mg total) by mouth daily for 30 doses. Take with oxicodone (opioid) to prevent constipation 30 tablet 0   No current facility-administered medications for this visit.    PHYSICAL EXAMINATION: ECOG PERFORMANCE STATUS: {CHL ONC ECOG PS:[id number]}  There were no vitals filed for this visit. There were no  vitals filed for this visit. *** GENERAL:alert, no distress and comfortable SKIN: skin color, texture, turgor are normal, no rashes or significant lesions EYES: normal, Conjunctiva are pink and non-injected, sclera clear {OROPHARYNX:no exudate, no erythema and lips, buccal mucosa, and tongue normal}  NECK: supple, thyroid normal size, non-tender, without nodularity LYMPH:  no palpable lymphadenopathy  in the cervical, axillary {or inguinal} LUNGS: clear to auscultation and percussion with normal breathing effort HEART: regular rate & rhythm and no murmurs and no lower extremity edema ABDOMEN:abdomen soft, non-tender and normal bowel sounds Musculoskeletal:no cyanosis of digits and no clubbing  NEURO: alert & oriented x 3 with fluent speech, no focal motor/sensory deficits  LABORATORY DATA:  I have reviewed the data as listed CBC Latest Ref Rng & Units 10/23/2020 05/27/2020 12/13/2019  WBC 4.0 - 10.5 K/uL 9.6 8.3 7.4  Hemoglobin 12.0 - 15.0 g/dL 11.5(L) 12.5 11.5(L)  Hematocrit 36.0 - 46.0 % 35.1(L) 37.2 34.2(L)  Platelets 150 - 400 K/uL 294 265.0 251.0    CMP Latest Ref Rng & Units 10/23/2020 05/27/2020 12/05/2019  Glucose 70 - 99 mg/dL 109(H) 89 91  BUN 8 - 23 mg/dL 32(H) 28(H) 39(H)  Creatinine 0.44 - 1.00 mg/dL 1.76(H) 1.71(H) 1.83(H)  Sodium 135 - 145 mmol/L 134(L) 138 137  Potassium 3.5 - 5.1 mmol/L 4.6 4.2 4.2  Chloride 98 - 111 mmol/L 102 104 103  CO2 22 - 32 mmol/L $RemoveB'22 26 26  'hFTcJpTB$ Calcium 8.9 - 10.3 mg/dL 9.0 9.0 9.6  Total Protein 6.5 - 8.1 g/dL 6.8 - 7.1  Total Bilirubin 0.3 - 1.2 mg/dL 0.5 - 0.3  Alkaline Phos 38 - 126 U/L 59 - 82  AST 15 - 41 U/L 20 - 15  ALT 0 - 44 U/L <5 - 8     RADIOGRAPHIC STUDIES: I have personally reviewed the radiological images as listed and agreed with the findings in the report. CT ABDOMEN PELVIS WO CONTRAST  Result Date: 10/23/2020 CLINICAL DATA:  Abdominal pain, constipation for 3 days, question bowel obstruction. History of colon cancer, breast  cancer, GERD, hypertension EXAM: CT ABDOMEN AND PELVIS WITHOUT CONTRAST TECHNIQUE: Multidetector CT imaging of the abdomen and pelvis was performed following the standard protocol without IV contrast. Sagittal and coronal MPR images reconstructed from axial data set. No oral contrast administered. COMPARISON:  None FINDINGS: Lower chest: Small LEFT pleural effusion and basilar atelectasis. Small pericardial effusion. Mitral annular calcification. Hepatobiliary: 2 small hepatic cysts larger 18 x 13 mm. Gallbladder and remainder of liver unremarkable. Pancreas: Cystic lesion at pancreatic head/uncinate process 2.5 x 2.3 x 2.3 cm. Remainder of pancreas normal. Spleen: Normal appearance Adrenals/Urinary Tract: RIGHT adrenal mass 2.8 x 2.1 cm, intermediate attenuation, nonspecific. LEFT adrenal gland normal. BILATERAL renal cortical atrophy. Small BILATERAL renal cysts. Hyperdense lesion mid RIGHT kidney 9 mm diameter, could represent hyperdense/hemorrhagic cyst or solid nodule. No hydronephrosis, proximal hydroureter or ureteral calcifications. Bladder and distal ureters obscured by beam hardening artifacts in pelvis Stomach/Bowel: Prior RIGHT ileocolic resection. Large and small bowel loops otherwise unremarkable. No evidence of bowel dilatation or bowel obstruction. Stomach decompressed, with suboptimal assessment of gastric wall thickness. Vascular/Lymphatic: Atherosclerotic calcifications aorta, iliac arteries, coronary arteries, visceral arteries. Aorta normal caliber. Abnormal soft tissue density in retroperitoneum, para-aortic, aortocaval, retrocaval, likely confluent adenopathy. Largest discrete nodes measure 20 mm LEFT para-aortic image 24 and 18 mm retrocaval image 30. Additional enlarged mesenteric nodes. Reproductive: Calcified uterine leiomyoma 18 mm diameter. Remainder of uterus obscured by beam hardening artifacts. Small cystic lesion 19 mm diameter with mural calcification RIGHT ovary, with RIGHT ovary  appearing mildly enlarged. Other: Small umbilical and periumbilical hernias containing fat. Minimal perisplenic ascites. Fluid/thickening at LEFT lateral conal fascia and in pelvis. No free air. Musculoskeletal: Osseous demineralization. BILATERAL hip prostheses. Degenerative disc and facet disease changes lumbar spine. Small lytic lesions within L1 and L5  vertebral bodies. IMPRESSION: Prior RIGHT ileocolic resection. Extensive retroperitoneal and mesenteric adenopathy highly suspicious for for either metastatic disease or lymphoma. RIGHT adrenal mass 2.8 x 2.1 cm, nonspecific, cannot exclude metastasis. Cystic lesion at pancreatic head/uncinate process 2.5 x 2.3 x 2.3 cm; based on patient age and comorbidities, no follow-up imaging recommended. Small LEFT pleural effusion and basilar atelectasis. Small pericardial effusion. Minimal ascites. Small umbilical and periumbilical hernias containing fat. Hyperdense/hemorrhagic cyst or solid nodule mid RIGHT kidney 9 mm diameter. Small lytic lesions within L1 and L5 vertebral bodies, cannot exclude osseous metastases. Aortic Atherosclerosis (ICD10-I70.0). Electronically Signed   By: Lavonia Dana M.D.   On: 10/23/2020 20:47   CT Head Wo Contrast  Result Date: 10/23/2020 CLINICAL DATA:  Dizziness EXAM: CT HEAD WITHOUT CONTRAST TECHNIQUE: Contiguous axial images were obtained from the base of the skull through the vertex without intravenous contrast. COMPARISON:  08/29/2020 FINDINGS: Brain: There is no acute intracranial hemorrhage, mass effect, or edema. Gray-white differentiation is preserved. There is no extra-axial fluid collection. Patchy confluent areas of hypoattenuation in the supratentorial white matter likely reflects stable chronic microvascular ischemic changes. Prominence of the ventricles and sulci reflects stable parenchymal volume loss. Small chronic left cerebellar infarct. Possible chronic bilateral subinsular and basal ganglia infarcts. Vascular: There is  atherosclerotic calcification at the skull base. Skull: Calvarium is unremarkable. Sinuses/Orbits: No acute finding. Other: None. IMPRESSION: No acute intracranial hemorrhage, edema, or evidence of acute infarction. No significant change since recent prior study. Electronically Signed   By: Macy Mis M.D.   On: 10/23/2020 18:02    ASSESSMENT & PLAN:  Stephanie Martinez is a 27 y.o. *** female with a history of ***  1 ***      PLAN:  ***   No orders of the defined types were placed in this encounter.   All questions were answered. The patient knows to call the clinic with any problems, questions or concerns. The total time spent in the appointment was {CHL ONC TIME VISIT - YOFVW:[id number]}.     Joslyn Devon 10/24/2020 2:10 PM  I, Joslyn Devon, am acting as scribe for Truitt Merle, MD.   {Add scribe attestation statement}

## 2020-10-24 NOTE — Telephone Encounter (Signed)
Received a call from Ms. Lindenbaum' daughter, Izora Gala, to schedule an appt w/Dr. Burr Medico. Pt was seen in the ED for abdominal pain. She's been scheduled to see Dr. Burr Medico on 5/11 at 3pm. Appt date and time has been given to the pt's daughter. Aware to arrive 20 minutes early.

## 2020-10-28 ENCOUNTER — Telehealth: Payer: Self-pay | Admitting: Hematology

## 2020-10-28 ENCOUNTER — Telehealth: Payer: Self-pay | Admitting: Internal Medicine

## 2020-10-28 NOTE — Telephone Encounter (Signed)
FYI -   LVM for [name], [facility] RN, stating that PCP will be the attending.

## 2020-10-28 NOTE — Telephone Encounter (Signed)
[name] called and said that she was going to put in their system that Dr. Ronnald Ramp would be the attending and their doctors will take care of the symptom management and prescriptions. She said if this is an issue to let her know . Please advise

## 2020-10-28 NOTE — Telephone Encounter (Signed)
Received a vm from Ms. Harshfield' dtr to cancel appt w/Dr. Burr Medico on 5/11. Pt will be under the care of hospice.

## 2020-10-28 NOTE — Telephone Encounter (Signed)
Sure. Ok with me.  

## 2020-10-28 NOTE — Telephone Encounter (Signed)
[name] w/ [facility] called and said that the patient is currently under [facility], but last week she was seen at [facility] where a CT of the abdomin was done and it showed some type of cancer. She has not had any diagnostic test to show what kind of cancer. [name] said that the patient and her daughter would like hospice care and are declining oncology support. [name] wanted to know if Dr. Ronnald Ramp wanted to be the attending on the orders.   Okay to LVM: [phone]

## 2020-10-29 ENCOUNTER — Inpatient Hospital Stay: Payer: Medicare Other | Admitting: Hematology

## 2020-10-29 NOTE — Telephone Encounter (Signed)
LVM for Stephanie Martinez informing her that PCP agrees with them treating symptoms and handling meds.

## 2020-11-05 ENCOUNTER — Other Ambulatory Visit: Payer: Medicare Other

## 2020-11-25 ENCOUNTER — Ambulatory Visit: Payer: Medicare Other | Admitting: Internal Medicine

## 2020-12-08 ENCOUNTER — Other Ambulatory Visit: Payer: Medicare Other

## 2020-12-19 DEATH — deceased
